# Patient Record
Sex: Male | Born: 1955
Health system: Southern US, Community
[De-identification: ages and names within clinical notes are randomized; demographics above are authoritative.]

## PROBLEM LIST (undated history)

## (undated) DIAGNOSIS — I739 Peripheral vascular disease, unspecified: Secondary | ICD-10-CM

## (undated) DIAGNOSIS — E119 Type 2 diabetes mellitus without complications: Secondary | ICD-10-CM

## (undated) DIAGNOSIS — I10 Essential (primary) hypertension: Secondary | ICD-10-CM

## (undated) DIAGNOSIS — E785 Hyperlipidemia, unspecified: Secondary | ICD-10-CM

## (undated) DIAGNOSIS — H9191 Unspecified hearing loss, right ear: Secondary | ICD-10-CM

## (undated) DIAGNOSIS — H9192 Unspecified hearing loss, left ear: Secondary | ICD-10-CM

## (undated) DIAGNOSIS — I509 Heart failure, unspecified: Secondary | ICD-10-CM

## (undated) DIAGNOSIS — N529 Male erectile dysfunction, unspecified: Secondary | ICD-10-CM

## (undated) HISTORY — PX: RETINAL LASER PROCEDURE: SHX2339

## (undated) HISTORY — PX: VASECTOMY: SHX75

## (undated) HISTORY — DX: Hyperlipidemia, unspecified: E78.5

## (undated) HISTORY — DX: Male erectile dysfunction, unspecified: N52.9

## (undated) HISTORY — DX: Essential (primary) hypertension: I10

## (undated) HISTORY — DX: Peripheral vascular disease, unspecified: I73.9

## (undated) NOTE — *Deleted (*Deleted)
***In Progress*** PCP: Dr. Sherril Croon HF Cardiology: Dr. Shirlee Latch  HPI:  14 y.o. with history of HTN, DM, PAD, CKD and systolic CHF presents for CHF clinic followup.  He has had a long history of difficult-to-control HTN.  Medications were adjusted and repeat echo in 12/17 showed improvement in EF to 60-65%.  He has known PAD which was significant on 12/17 peripheral arterial dopplers.  He saw Dr. Allyson Sabal for evaluation and conservative treatment was recommended.  Echo in 12/18 showed EF 60-65% and moderate LVH. Echo in 11/20 showed EF 60-65%, RV normal.   Of note, he did not tolerate spironolactone due to hyperkalemia.   Recently returned for routine follow-up with Robbie Lis, PA on 10/18/20. He reported feeling well and denied resting or exertional dyspnea. No orthopnea/ PND or LEE. Also denied CP. No LE claudication. Reported full med compliance but had not yet taken morning meds. BP was 160/90. He reported his BP at home was usually ~140s/70-80s. EKG showed NSR w/ mild inferolateral TWIs, c/w prior EKGs. His weight was up 8 lbs since his visit last year. ReDs Clip 33%.   Today he returns to HF clinic for pharmacist medication titration. At last visit with APP, Entresto was increased to 97/103 mg BID.   164/90, HR 84, Wt 189 lbs *Check BMET since incr'd Entresto *Off spiro since Jan 2020 for hyperK (5.8) Plan A: Increase hydral 100 TID for BP Plan B: ?spiro 12.5 and monitor closely F/u: DM in december  Overall feeling ***. Dizziness, lightheadedness, fatigue:  Chest pain or palpitations:  How is your breathing?: *** SOB: Able to complete all ADLs. Activity level ***  Weight at home pounds. Takes furosemide/torsemide/bumex *** mg *** daily.  LEE PND/Orthopnea  Appetite *** Low-salt diet:   Physical Exam Cost/affordability of meds     HF Medications: Carvedilol 25 mg BID Entresto 97/103 mg BID Hydralazine 75 mg TID Isosorbide mononitrate 60 mg daily -Amlodipine 10 mg  daily  Has the patient been experiencing any side effects to the medications prescribed?  {YES NO:22349}  Does the patient have any problems obtaining medications due to transportation or finances?   {YES NO:22349} - Entresto from [employer]  Understanding of regimen: {excellent/good/fair/poor:19665} Understanding of indications: {excellent/good/fair/poor:19665} Potential of compliance: {excellent/good/fair/poor:19665} Patient understands to avoid NSAIDs. Patient understands to avoid decongestants.    Pertinent Lab Values: . Serum creatinine ***, BUN ***, Potassium ***, Sodium ***, BNP ***, Magnesium ***, Digoxin ***   Vital Signs: . Weight: *** (last clinic weight: ***) . Blood pressure: ***  . Heart rate: ***   Assessment: 1. Chronic systolic CHF: Echo 11/16 with EF 30-35%.  Given improvement in EF to 60-65% in 12/17 and 12/18 with BP control, suspect this was a hypertensive cardiomyopathy.  EF stable on repeat echo 11/20, 60-65%, mod LVH, RV normal.  - NYHA Class II, Volume status stable on exam and by ReDs clip, 33%*** - Continue carvedilol 25 mg BID - Continue Entresto 97/103 mg BID - Continue hydralazine 75 mg TID - Continue isosorbide mononitrate 60 mg daily  - He is off spironolactone due to hyperkalemia.    2. HTN: Renal arterial dopplers in 11/16 did not show evidence for renal artery stenosis. Sleep study negative for OSA. BP elevated today 160/90 but has not yet taken am meds. BP at home averages in 140s/70s-80, per pt report.  - Continue amlodipine 10 mg daily - Continue carvedilol, Entresto, hydralazine, and isosorbide mononitrate as above  3. PAD: denies claudication.  ABIs abnormal in  1/20 but stable. Multiple risk factors for disease progression and decreased pedal pulses on exam - repeat bilateral LE arterial dopplers  - Continue ASA 81 mg daily - Continue atorvastatin 80 mg daily    4. Hyperlipidemia: LDL goal w/ PAD < 70 mg/dl. - Continue atorvastatin 80 mg  daily  5. CKD: Stage 3.    6. T2DM  - Most recent hgb A1c 7.9 - On insulin, managed by PCP    Plan: 1) Medication changes: Based on clinical presentation, vital signs and recent labs will *** 2) Labs: *** 3) Follow-up: 12/05/20 w/ Dr. Shirlee Latch   Tama Headings, PharmD PGY2 Cardiology Pharmacy Resident  Karle Plumber, PharmD, BCPS, [facility], CPP Heart Failure Clinic Pharmacist [phone]

---

## 2002-10-24 ENCOUNTER — Ambulatory Visit (HOSPITAL_COMMUNITY): Admission: RE | Admit: 2002-10-24 | Discharge: 2002-10-24 | Payer: Self-pay | Admitting: Cardiology

## 2006-03-18 ENCOUNTER — Ambulatory Visit: Payer: Self-pay | Admitting: Cardiology

## 2011-11-06 ENCOUNTER — Encounter: Payer: Self-pay | Admitting: Vascular Surgery

## 2011-11-24 ENCOUNTER — Encounter: Payer: Self-pay | Admitting: Vascular Surgery

## 2011-11-25 ENCOUNTER — Ambulatory Visit (INDEPENDENT_AMBULATORY_CARE_PROVIDER_SITE_OTHER): Payer: Medicare Other | Admitting: Vascular Surgery

## 2011-11-25 ENCOUNTER — Encounter: Payer: Self-pay | Admitting: Vascular Surgery

## 2011-11-25 VITALS — BP 239/104 | HR 104 | Resp 20 | Ht 65.0 in | Wt 194.0 lb

## 2011-11-25 DIAGNOSIS — I7092 Chronic total occlusion of artery of the extremities: Secondary | ICD-10-CM

## 2011-11-25 NOTE — Progress Notes (Signed)
Subjective:     Patient ID: Derek Mckenzie, male   DOB: 03-12-1956, 55 y.o.   MRN: [id number]  HPI 55 year old male was referred for lower extremity occlusive disease. He has some mild right calf claudication symptoms after walking a few blocks. Both feet feel "cold" at times. He denies rest pain or history of nonhealing ulcers, infection, cellulitis, or gangrene. He will occasionally have some mild tingling in both feet. He states his legs are not limiting his ability to "get around"  Past Medical History  Diagnosis Date  . Diabetes mellitus   . ED (erectile dysfunction)   . PAD (peripheral artery disease)   . Hyperlipidemia   . Hypertension     History  Substance Use Topics  . Smoking status: Current Everyday Smoker -- 0.5 packs/day    Types: Cigarettes  . Smokeless tobacco: Never Used  . Alcohol Use: No    Family History  Problem Relation Age of Onset  . Hypertension Mother   . Stroke Mother   . Heart disease Father     No Known Allergies  Current outpatient prescriptions:albuterol (PROVENTIL HFA;VENTOLIN HFA) 108 (90 BASE) MCG/ACT inhaler, Inhale 2 puffs into the lungs 2 (two) times daily.  , Disp: , Rfl: ;  amLODipine-valsartan (EXFORGE) 10-320 MG per tablet, Take 1 tablet by mouth daily.  , Disp: , Rfl: ;  atenolol (TENORMIN) 100 MG tablet, Take 100 mg by mouth daily.  , Disp: , Rfl:  azelastine (ASTELIN) 137 MCG/SPRAY nasal spray, Place 1 spray into the nose 2 (two) times daily. Use in each nostril as directed , Disp: , Rfl: ;  glucose blood test strip, 1 each by Other route as needed. Use as instructed , Disp: , Rfl: ;  guaiFENesin-codeine (ROBITUSSIN AC) 100-10 MG/5ML syrup, Take 5 mLs by mouth at bedtime as needed and may repeat dose one time if needed.  , Disp: , Rfl:  insulin aspart protamine-insulin aspart (NOVOLOG 70/30) (70-30) 100 UNIT/ML injection, Inject 58 Units into the skin 2 (two) times daily with a meal.  , Disp: , Rfl: ;  Insulin Pen Needle (NOVOFINE) 30G X  8 MM MISC, Inject 1 packet into the skin as needed.  , Disp: , Rfl: ;  omeprazole (PRILOSEC) 20 MG capsule, Take 20 mg by mouth daily.  , Disp: , Rfl: ;  sildenafil (VIAGRA) 100 MG tablet, Take 100 mg by mouth daily as needed.  , Disp: , Rfl:  tadalafil (CIALIS) 20 MG tablet, Take 20 mg by mouth daily as needed.  , Disp: , Rfl: ;  testosterone (ANDROGEL) 50 MG/5GM GEL, Place 5 g onto the skin daily.  , Disp: , Rfl:   BP 239/104  Pulse 104  Resp 20  Ht 5\' 5"  (1.651 m)  Wt 194 lb (87.998 kg)  BMI 32.28 kg/m2  Body mass index is 32.28 kg/(m^2).        Review of Systems patient complains of very poor hearing denies chest pain, dyspnea on exertion, PND, orthopnea, mopped assist, urinary problems, all other systems are negative and a complete review of systems     Objective:   Physical Exam blood pressure 239/104 heart rate 104 respirations 20 General he is a well-developed well-nourished male in no apparent distress with very poor hearing HEENT normal for age Chest no rhonchi or wheezing Cardiovascular regular and no murmurs carotid pulses 3+ no audible bruits Abdomen soft and obese no palpable masses He has 3+ femoral pulse on the left and 1-2+ femoral pulse on  the right with well-perfused lower extremities. Both feet are well-perfused. There is no edema. No ulcerations are noted  Lower extremity arterial Doppler study performed by[facility] revealed ABI of 0.62 on the right and 1.0 on the left. He had dampened waveforms on the right throughout.    Assessment:     Diffuse occlusive disease and right lower extremity with external iliac and femoral popliteal component. No severe symptoms at this time. Not limb threatening.    Plan:     Return if symptoms worsen with severe claudication or limb threatening ischemia such as rest pain, nonhealing ulcers, infection, or gangrene of the toe No indication to treat at the present time

## 2011-12-16 ENCOUNTER — Encounter: Payer: Self-pay | Admitting: Vascular Surgery

## 2011-12-16 ENCOUNTER — Other Ambulatory Visit: Payer: Self-pay

## 2014-06-26 DIAGNOSIS — I119 Hypertensive heart disease without heart failure: Secondary | ICD-10-CM | POA: Insufficient documentation

## 2014-06-26 DIAGNOSIS — R0602 Shortness of breath: Secondary | ICD-10-CM | POA: Insufficient documentation

## 2015-10-31 ENCOUNTER — Emergency Department (HOSPITAL_COMMUNITY): Payer: Medicare Other

## 2015-10-31 ENCOUNTER — Encounter (HOSPITAL_COMMUNITY): Payer: Self-pay | Admitting: Emergency Medicine

## 2015-10-31 ENCOUNTER — Observation Stay (HOSPITAL_COMMUNITY)
Admission: EM | Admit: 2015-10-31 | Discharge: 2015-11-01 | Disposition: A | Discharge status: Home / Self Care | Payer: Medicare Other | Attending: Infectious Diseases | Admitting: Infectious Diseases

## 2015-10-31 ENCOUNTER — Observation Stay (HOSPITAL_BASED_OUTPATIENT_CLINIC_OR_DEPARTMENT_OTHER): Payer: Medicare Other

## 2015-10-31 DIAGNOSIS — I5022 Chronic systolic (congestive) heart failure: Secondary | ICD-10-CM | POA: Diagnosis present

## 2015-10-31 DIAGNOSIS — I130 Hypertensive heart and chronic kidney disease with heart failure and stage 1 through stage 4 chronic kidney disease, or unspecified chronic kidney disease: Principal | ICD-10-CM | POA: Insufficient documentation

## 2015-10-31 DIAGNOSIS — I272 Other secondary pulmonary hypertension: Secondary | ICD-10-CM | POA: Diagnosis not present

## 2015-10-31 DIAGNOSIS — R000 Tachycardia, unspecified: Secondary | ICD-10-CM | POA: Diagnosis not present

## 2015-10-31 DIAGNOSIS — H3562 Retinal hemorrhage, left eye: Secondary | ICD-10-CM

## 2015-10-31 DIAGNOSIS — E785 Hyperlipidemia, unspecified: Secondary | ICD-10-CM | POA: Diagnosis not present

## 2015-10-31 DIAGNOSIS — R0609 Other forms of dyspnea: Secondary | ICD-10-CM

## 2015-10-31 DIAGNOSIS — I509 Heart failure, unspecified: Secondary | ICD-10-CM

## 2015-10-31 DIAGNOSIS — H3560 Retinal hemorrhage, unspecified eye: Secondary | ICD-10-CM | POA: Diagnosis not present

## 2015-10-31 DIAGNOSIS — N189 Chronic kidney disease, unspecified: Secondary | ICD-10-CM | POA: Insufficient documentation

## 2015-10-31 DIAGNOSIS — E119 Type 2 diabetes mellitus without complications: Secondary | ICD-10-CM | POA: Insufficient documentation

## 2015-10-31 DIAGNOSIS — I10 Essential (primary) hypertension: Secondary | ICD-10-CM | POA: Insufficient documentation

## 2015-10-31 DIAGNOSIS — E1151 Type 2 diabetes mellitus with diabetic peripheral angiopathy without gangrene: Secondary | ICD-10-CM

## 2015-10-31 DIAGNOSIS — Z794 Long term (current) use of insulin: Secondary | ICD-10-CM | POA: Diagnosis not present

## 2015-10-31 DIAGNOSIS — E11319 Type 2 diabetes mellitus with unspecified diabetic retinopathy without macular edema: Secondary | ICD-10-CM | POA: Diagnosis not present

## 2015-10-31 DIAGNOSIS — R0601 Orthopnea: Secondary | ICD-10-CM | POA: Insufficient documentation

## 2015-10-31 DIAGNOSIS — F17210 Nicotine dependence, cigarettes, uncomplicated: Secondary | ICD-10-CM | POA: Diagnosis not present

## 2015-10-31 DIAGNOSIS — I517 Cardiomegaly: Secondary | ICD-10-CM | POA: Diagnosis not present

## 2015-10-31 DIAGNOSIS — E1122 Type 2 diabetes mellitus with diabetic chronic kidney disease: Secondary | ICD-10-CM

## 2015-10-31 DIAGNOSIS — H918X3 Other specified hearing loss, bilateral: Secondary | ICD-10-CM | POA: Diagnosis not present

## 2015-10-31 DIAGNOSIS — Z79899 Other long term (current) drug therapy: Secondary | ICD-10-CM | POA: Insufficient documentation

## 2015-10-31 DIAGNOSIS — E118 Type 2 diabetes mellitus with unspecified complications: Secondary | ICD-10-CM | POA: Diagnosis present

## 2015-10-31 DIAGNOSIS — R0602 Shortness of breath: Secondary | ICD-10-CM

## 2015-10-31 DIAGNOSIS — I739 Peripheral vascular disease, unspecified: Secondary | ICD-10-CM | POA: Diagnosis not present

## 2015-10-31 HISTORY — DX: Unspecified hearing loss, right ear: H91.91

## 2015-10-31 HISTORY — DX: Type 2 diabetes mellitus without complications: E11.9

## 2015-10-31 HISTORY — DX: Unspecified hearing loss, left ear: H91.92

## 2015-10-31 HISTORY — DX: Heart failure, unspecified: I50.9

## 2015-10-31 LAB — CBC
HCT: 41.3 % (ref 39.0–52.0)
Hemoglobin: 13.6 g/dL (ref 13.0–17.0)
MCH: 28.8 pg (ref 26.0–34.0)
MCHC: 32.9 g/dL (ref 30.0–36.0)
MCV: 87.5 fL (ref 78.0–100.0)
Platelets: 242 10*3/uL (ref 150–400)
RBC: 4.72 MIL/uL (ref 4.22–5.81)
RDW: 14.3 % (ref 11.5–15.5)
WBC: 10.3 10*3/uL (ref 4.0–10.5)

## 2015-10-31 LAB — URINALYSIS, ROUTINE W REFLEX MICROSCOPIC
BILIRUBIN URINE: NEGATIVE
Glucose, UA: NEGATIVE mg/dL
Ketones, ur: NEGATIVE mg/dL
Leukocytes, UA: NEGATIVE
Nitrite: NEGATIVE
PROTEIN: 100 mg/dL — AB
SPECIFIC GRAVITY, URINE: 1.01 (ref 1.005–1.030)
UROBILINOGEN UA: 0.2 mg/dL (ref 0.0–1.0)
pH: 5 (ref 5.0–8.0)

## 2015-10-31 LAB — COMPREHENSIVE METABOLIC PANEL
ALT: 15 U/L — ABNORMAL LOW (ref 17–63)
ANION GAP: 9 (ref 5–15)
AST: 23 U/L (ref 15–41)
Albumin: 3.4 g/dL — ABNORMAL LOW (ref 3.5–5.0)
Alkaline Phosphatase: 87 U/L (ref 38–126)
BILIRUBIN TOTAL: 0.7 mg/dL (ref 0.3–1.2)
BUN: 27 mg/dL — ABNORMAL HIGH (ref 6–20)
CO2: 24 mmol/L (ref 22–32)
Calcium: 9.3 mg/dL (ref 8.9–10.3)
Chloride: 103 mmol/L (ref 101–111)
Creatinine, Ser: 1.43 mg/dL — ABNORMAL HIGH (ref 0.61–1.24)
GFR calc Af Amer: >60 mL/min (ref >60)
GFR, EST NON AFRICAN AMERICAN: 52 mL/min — AB (ref >60)
Glucose, Bld: 217 mg/dL — ABNORMAL HIGH (ref 65–99)
POTASSIUM: 4.3 mmol/L (ref 3.5–5.1)
Sodium: 136 mmol/L (ref 135–145)
TOTAL PROTEIN: 7.1 g/dL (ref 6.5–8.1)

## 2015-10-31 LAB — RAPID URINE DRUG SCREEN, HOSP PERFORMED
AMPHETAMINES: NOT DETECTED
BENZODIAZEPINES: NOT DETECTED
Barbiturates: NOT DETECTED
Cocaine: NOT DETECTED
OPIATES: NOT DETECTED
Tetrahydrocannabinol: NOT DETECTED

## 2015-10-31 LAB — BRAIN NATRIURETIC PEPTIDE: B Natriuretic Peptide: 196.9 pg/mL — ABNORMAL HIGH (ref 0.0–100.0)

## 2015-10-31 LAB — URINE MICROSCOPIC-ADD ON

## 2015-10-31 LAB — GLUCOSE, CAPILLARY
GLUCOSE-CAPILLARY: 183 mg/dL — AB (ref 65–99)
GLUCOSE-CAPILLARY: 218 mg/dL — AB (ref 65–99)
Glucose-Capillary: 180 mg/dL — ABNORMAL HIGH (ref 65–99)

## 2015-10-31 LAB — I-STAT TROPONIN, ED: TROPONIN I, POC: 0.02 ng/mL (ref 0.00–0.08)

## 2015-10-31 LAB — TSH: TSH: 1.36 u[IU]/mL (ref 0.350–4.500)

## 2015-10-31 MED ORDER — DIFLUPREDNATE 0.05 % OP EMUL
1.0000 [drp] | Freq: Two times a day (BID) | OPHTHALMIC | Status: DC
  Administered 2015-11-01: 1 [drp] via OPHTHALMIC

## 2015-10-31 MED ORDER — ATORVASTATIN CALCIUM 20 MG PO TABS
20.0000 mg | ORAL_TABLET | Freq: Every day | ORAL | Status: DC
  Administered 2015-11-01: 20 mg via ORAL
  Filled 2015-10-31 (×2): qty 1

## 2015-10-31 MED ORDER — AMLODIPINE BESYLATE 10 MG PO TABS
10.0000 mg | ORAL_TABLET | Freq: Every day | ORAL | Status: DC
  Administered 2015-10-31 – 2015-11-01 (×2): 10 mg via ORAL
  Filled 2015-10-31: qty 2
  Filled 2015-10-31: qty 1

## 2015-10-31 MED ORDER — INSULIN ASPART 100 UNIT/ML ~~LOC~~ SOLN
0.0000 [IU] | Freq: Three times a day (TID) | SUBCUTANEOUS | Status: DC
  Administered 2015-10-31 (×2): 2 [IU] via SUBCUTANEOUS
  Administered 2015-11-01: 5 [IU] via SUBCUTANEOUS
  Administered 2015-11-01: 2 [IU] via SUBCUTANEOUS

## 2015-10-31 MED ORDER — SENNOSIDES-DOCUSATE SODIUM 8.6-50 MG PO TABS: 1.0000 | ORAL_TABLET | Freq: Every evening | ORAL | Status: DC | PRN

## 2015-10-31 MED ORDER — HYDRALAZINE HCL 20 MG/ML IJ SOLN
5.0000 mg | Freq: Once | INTRAMUSCULAR | Status: AC
  Administered 2015-10-31: 5 mg via INTRAVENOUS
  Filled 2015-10-31: qty 1

## 2015-10-31 MED ORDER — OFLOXACIN 0.3 % OP SOLN
1.0000 [drp] | Freq: Four times a day (QID) | OPHTHALMIC | Status: DC
  Administered 2015-10-31 – 2015-11-01 (×3): 1 [drp] via OPHTHALMIC

## 2015-10-31 MED ORDER — ACETAMINOPHEN 325 MG PO TABS: 650.0000 mg | ORAL_TABLET | Freq: Four times a day (QID) | ORAL | Status: DC | PRN

## 2015-10-31 MED ORDER — ALBUTEROL SULFATE (2.5 MG/3ML) 0.083% IN NEBU: 2.5000 mg | INHALATION_SOLUTION | RESPIRATORY_TRACT | Status: DC | PRN

## 2015-10-31 MED ORDER — SPIRONOLACTONE 50 MG PO TABS
50.0000 mg | ORAL_TABLET | Freq: Two times a day (BID) | ORAL | Status: DC
  Administered 2015-10-31 – 2015-11-01 (×4): 50 mg via ORAL
  Filled 2015-10-31 (×6): qty 1

## 2015-10-31 MED ORDER — FUROSEMIDE 10 MG/ML IJ SOLN
20.0000 mg | INTRAMUSCULAR | Status: AC
  Administered 2015-10-31: 20 mg via INTRAVENOUS
  Filled 2015-10-31: qty 2

## 2015-10-31 MED ORDER — ENOXAPARIN SODIUM 40 MG/0.4ML ~~LOC~~ SOLN
40.0000 mg | SUBCUTANEOUS | Status: DC
  Administered 2015-10-31: 40 mg via SUBCUTANEOUS
  Filled 2015-10-31 (×2): qty 0.4

## 2015-10-31 MED ORDER — OFLOXACIN 0.3 % OP SOLN
1.0000 [drp] | Freq: Four times a day (QID) | OPHTHALMIC | Status: DC
  Administered 2015-10-31: 1 [drp] via OPHTHALMIC
  Filled 2015-10-31: qty 5

## 2015-10-31 MED ORDER — INSULIN ASPART 100 UNIT/ML ~~LOC~~ SOLN: 0.0000 [IU] | Freq: Three times a day (TID) | SUBCUTANEOUS | Status: DC

## 2015-10-31 MED ORDER — LATANOPROST 0.005 % OP SOLN
1.0000 [drp] | Freq: Every day | OPHTHALMIC | Status: DC
  Administered 2015-10-31: 1 [drp] via OPHTHALMIC
  Filled 2015-10-31: qty 2.5

## 2015-10-31 MED ORDER — ACETAMINOPHEN 650 MG RE SUPP: 650.0000 mg | Freq: Four times a day (QID) | RECTAL | Status: DC | PRN

## 2015-10-31 NOTE — ED Notes (Signed)
Pt placed in gown and in bed. Pt monitored by pulse ox, bp cuff, and 12-lead. 

## 2015-10-31 NOTE — ED Provider Notes (Signed)
CSN: [id number]     Arrival date & time 10/31/15  0741 History   First MD Initiated Contact with Patient 10/31/15 [phone]     Chief Complaint  Patient presents with  . Shortness of Breath   Derek Mckenzie is a 59 y.o. male with a history of diet PEs, peripheral artery disease, hyperlipidemia, hypertension and is hard of hearing who presents to the emergency department after being sent by the day [facility] because the patient was unable to lie flat for his eye surgery. Patient reports he feels very short of breath when he lies flat. He reports this is been chronic and ongoing has not worsened recently. He reports he was due to have left eye surgery today but was unable to lie flat for the surgery. Reports only feels short of breath and lying down when sitting upright he does not feel short of breath. He denies dyspnea on exertion. He reports he chronically has some lower extremity edema which has not worsened. He reports he is not on any blood pressure medication or diuretics. The patient is a former smoker. He denies history of COPD, CHF or emphysema. The patient denies fevers, chest pain, wheezing, coughing, abdominal pain, nausea, vomiting, leg pain, syncope, lightheadedness, dizziness, or headache.  (Consider location/radiation/quality/duration/timing/severity/associated sxs/prior Treatment) HPI  Past Medical History  Diagnosis Date  . Diabetes mellitus   . ED (erectile dysfunction)   . PAD (peripheral artery disease) ([place])   . Hyperlipidemia   . Hypertension    Past Surgical History  Procedure Laterality Date  . Vasectomy     Family History  Problem Relation Age of Onset  . Hypertension Mother   . Stroke Mother   . Heart disease Father    Social History  Substance Use Topics  . Smoking status: Current Every Day Smoker -- 0.50 packs/day    Types: Cigarettes  . Smokeless tobacco: Never Used  . Alcohol Use: No    Review of Systems  Constitutional: Negative for fever and  chills.  HENT: Negative for congestion and sore throat.   Eyes: Negative for visual disturbance.  Respiratory: Positive for shortness of breath. Negative for cough, chest tightness and wheezing.   Cardiovascular: Positive for leg swelling. Negative for chest pain.  Gastrointestinal: Negative for nausea, vomiting, abdominal pain and diarrhea.  Genitourinary: Negative for dysuria and difficulty urinating.  Musculoskeletal: Negative for back pain and neck pain.  Skin: Negative for rash and wound.  Neurological: Negative for syncope, weakness, light-headedness and headaches.      Allergies  Atenolol and Amlodipine besy-benazepril hcl  Home Medications   Prior to Admission medications   Medication Sig Start Date End Date Taking? Authorizing Provider  atorvastatin (LIPITOR) 20 MG tablet Take 20 mg by mouth daily. 09/28/15  Yes Historical Provider, MD  Difluprednate (DUREZOL) 0.05 % EMUL Place 1 drop into the left eye 2 (two) times daily.   Yes Historical Provider, MD  glucose blood test strip 1 each by Other route as needed. Use as instructed    Yes Historical Provider, MD  insulin NPH Human (HUMULIN N,NOVOLIN N) 100 UNIT/ML injection Inject 30 Units into the skin 2 (two) times daily before a meal.   Yes Historical Provider, MD  Insulin Pen Needle (NOVOFINE) 30G X 8 MM MISC Inject 1 packet into the skin as needed.     Yes Historical Provider, MD  latanoprost (XALATAN) 0.005 % ophthalmic solution Place 1 drop into both eyes at bedtime.   Yes Historical Provider, MD  ofloxacin (OCUFLOX) 0.3 % ophthalmic solution Place 1 drop into the left eye 4 (four) times daily.   Yes Historical Provider, MD   BP 231/126 mmHg  Pulse 109  Temp(Src) 98.5 F (36.9 C) (Oral)  Resp 15  Ht 5\' 5"  (1.651 m)  Wt 180 lb (81.647 kg)  BMI 29.95 kg/m2  SpO2 95% Physical Exam  Constitutional: He is oriented to person, place, and time. He appears well-developed and well-nourished. No distress.  Nontoxic appearing.   HENT:  Head: Normocephalic and atraumatic.  Mouth/Throat: Oropharynx is clear and moist.  Eyes: Conjunctivae are normal. Right eye exhibits no discharge. Left eye exhibits no discharge.  Neck: Normal range of motion. Neck supple. No JVD present. No tracheal deviation present.  Cardiovascular: Regular rhythm, normal heart sounds and intact distal pulses.  Exam reveals no gallop and no friction rub.   No murmur heard. Tachycardic with heart rate of 108. Bilateral radial, and dorsalis pedis pulses are intact.  Pulmonary/Chest: Effort normal. No respiratory distress. He has no wheezes. He has no rales.  Scattered crackles noted bilaterally. No respiratory distress. Patient speaking in complete sentences. The patient's oxygen saturation is 85% on room air and came up to 98% with 2 L via nasal cannula.  Abdominal: Soft. He exhibits no distension. There is no tenderness. There is no guarding.  Musculoskeletal: He exhibits edema. He exhibits no tenderness.  Bilateral pitting lower extremity and pedal edema. No lower extremity tenderness to palpation.  Lymphadenopathy:    He has no cervical adenopathy.  Neurological: He is alert and oriented to person, place, and time. Coordination normal.  Skin: Skin is warm and dry. No rash noted. He is not diaphoretic. No erythema. No pallor.  Psychiatric: He has a normal mood and affect. His behavior is normal.  Nursing note and vitals reviewed.   ED Course  Procedures (including critical care time) Labs Review Labs Reviewed  BRAIN NATRIURETIC PEPTIDE - Abnormal; Notable for the following:    B Natriuretic Peptide 196.9 (*)    All other components within normal limits  COMPREHENSIVE METABOLIC PANEL - Abnormal; Notable for the following:    Glucose, Bld 217 (*)    BUN 27 (*)    Creatinine, Ser 1.43 (*)    Albumin 3.4 (*)    ALT 15 (*)    GFR calc non Af Amer 52 (*)    All other components within normal limits  CBC  HIV ANTIBODY (ROUTINE TESTING)   URINE RAPID DRUG SCREEN, HOSP PERFORMED  URINALYSIS, ROUTINE W REFLEX MICROSCOPIC (NOT AT [facility])  Randolm Idol, ED    Imaging Review Dg Chest 2 View  10/31/2015  CLINICAL DATA:  Shortness of breath for 2 days EXAM: CHEST  2 VIEW COMPARISON:  December 12, 2012 FINDINGS: There is mild interstitial edema in the mid lower lung zones. There is no airspace consolidation. There is cardiomegaly with mild pulmonary venous hypertension. No adenopathy. There is atherosclerotic calcification in the aorta. No bone lesions. IMPRESSION: Evidence of a degree of congestive heart failure. No airspace consolidation. Electronically Signed   By: Lowella Grip III M.D.   On: 10/31/2015 09:02   I have personally reviewed and evaluated these images and lab results as part of my medical decision-making.   EKG Interpretation   Date/Time:  Wednesday October 31 2015 07:53:24 EDT Ventricular Rate:  107 PR Interval:  162 QRS Duration: 95 QT Interval:  357 QTC Calculation: 476 R Axis:   64 Text Interpretation:  Sinus tachycardia Probable  left atrial enlargement  Abnormal T, consider ischemia, diffuse leads Confirmed by Alvino Chapel  MD,  NATHAN [phone]) on 10/31/2015 10:58:06 AM      Filed Vitals:   10/31/15 0945 10/31/15 1000 10/31/15 1015 10/31/15 1030  BP: 204/105 210/95 210/115 231/126  Pulse: 100 104 110 109  Temp:      TempSrc:      Resp: 14 21 17 15   Height:      Weight:      SpO2: 93% 94% 94% 95%     MDM   Meds given in ED:  Medications  amLODipine (NORVASC) tablet 10 mg (10 mg Oral Given 10/31/15 1011)  spironolactone (ALDACTONE) tablet 50 mg (not administered)  furosemide (LASIX) injection 20 mg (20 mg Intravenous Given 10/31/15 7867)    New Prescriptions   No medications on file    Final diagnoses:  SOB (shortness of breath)   This is a 59 y.o. male with a history of diet PEs, peripheral artery disease, hyperlipidemia, hypertension and is hard of hearing who presents to the  emergency department after being sent by the day [facility] because the patient was unable to lie flat for his eye surgery. Patient reports he feels very short of breath when he lies flat. He reports this is been chronic and ongoing has not worsened recently. He reports he was due to have left eye surgery today but was unable to lie flat for the surgery. Reports only feels short of breath and lying down when sitting upright he does not feel short of breath. He denies dyspnea on exertion. He reports he chronically has some lower extremity edema which has not worsened. He reports he is not on any blood pressure medication or diuretics. On examination the patient is afebrile nontoxic appearing. He is mildly tachycardic with a heart rate of 110. He is hypertensive with a blood pressure of 221/95. Initially his oxygen saturation is 85% on room air. This improved immediately with starting 2 L of oxygen via nasal cannula. His oxygenation saturation is 98% with oxygen. Patient has crackles bilaterally on lung exam. He is in no respiratory distress. He reports he only feels short of breath on lying down. He denies chest pain. His bilateral lower extremity pitting edema. Chest x-ray indicates evidence of a degree of congestive heart failure and no airspace consolidation. CMP is normal for glucose of 217, and a creatinine of 1.43. Potassium is 4.3. There is no baseline labs to compare. His BNP is 196. CBC is unremarkable. Troponin is 0.02. Will provide patient with Lasix 20 mg and consult for admission for CHF. Patient denies history of CHF. Patient is in agreement with admission. I consulted with internal medicine teaching service Dr. Gordy Levan who accepted the patient for admission.  This patient was discussed with Dr. Alvino Chapel who agrees with assessment and plan.    Waynetta Pean, PA-C 10/31/15 Agency, MD 10/31/15 [phone]

## 2015-10-31 NOTE — Progress Notes (Signed)
Paged Dr. Gordy Levan regarding pt now being on 3E and BP 220's/100's. MD aware and this is pt "normal". Pt standing watching TV and appears to be in no distress. Will continue to monitor.

## 2015-10-31 NOTE — ED Notes (Signed)
POV from day surgery, eye surgery canceled due to SOB, cannot lie flat, edema noted to legs,  No CP or other complaints

## 2015-10-31 NOTE — Progress Notes (Signed)
Echocardiogram 2D Echocardiogram has been performed.  Derek Mckenzie 10/31/2015, 3:12 PM

## 2015-10-31 NOTE — H&P (Signed)
Date: 10/31/2015               Patient Name:  Derek Mckenzie MRN: [id number]  DOB: 1956-11-25 Age / Sex: 59 y.o., male   PCP: Derek Chroman, MD           Medical Service: Internal Medicine Teaching Service         Attending Physician: Dr. Campbell Riches, MD    First Contact: Dr. Santina Evans, MD Pager: [phone]  Second Contact: Dr. Arcelia Jew, MD Pager: [phone] (7AM-5PM Mon-Fri)       After Hours (After 5p/  First Contact Pager: [phone]  weekends / holidays): Second Contact Pager: [phone]    Most Recent Discharge Date:  10/24/02  Chief Complaint:  Chief Complaint  Patient presents with  . Shortness of Breath       History of Present Illness:  Derek Mckenzie is a 59 y.o. male who has a past medical history of Diabetes mellitus; PAD (peripheral artery disease) ([place]); Hyperlipidemia; and Hypertension.[place] Kitchen    He presents to the ED with dyspnea while attempting to lie flat today for eye surgery and was brought to the ED.  BP was also significantly elevated in the 220/100 range which patient reports is normal for years.  He reports being on many blood pressure medications at one time but nothing has ever brought his BP down.  He also experiences significant side effects to meds.  He is followed by a PCP in in [place], Derek Mckenzie who he saw a couple of weeks ago.  He is on lipitor and insulin but no other meds.  He reports having orthopnea for years and is unable to lie flat and therefore sleeps on his side.  Reports using 1 pillow.  Denies any dyspnea on exertion and walks around [employer] frequently without any dyspnea.  Denies any LE edema or weight gain.  Denies any CP, fever, chills.  He reports having a heart cath years ago with Derek Mckenzie? that was normal.  Does not recall having an echocardiogram.  Also reports having a sleep study that was normal.  Denies current tobacco abuse (reports smoking for 20+ yrs but quit 6 yrs ago), alcohol use, or any other recreational drug use.  Denies consuming a  lot of sodium.  Pt unaware of any FH of uncontrolled HTN.   CXR in the ED shows mild interstitial edema in the mid-lower lung zones and cardiomegaly with pulmonary venous HTN.  Also with atherosclerotic calcification in the aorta.  BNP slightly elevated at 196.9.  Initially hypoxic in the ED ~86% on RA but came up to 97% RA when I saw him.  EKG with sinus tachycardia and TWI in leads I, V5,6 without any prior for comparison.  I-stat trop neg.   In the ED, pt was given 20mg  IV lasix and amlodipine 10mg .    Meds: Current Facility-Administered Medications  Medication Dose Route Frequency Provider Last Rate Last Dose  . acetaminophen (TYLENOL) tablet 650 mg  650 mg Oral Q6H PRN Derek Bales, MD       Or  . acetaminophen (TYLENOL) suppository 650 mg  650 mg Rectal Q6H PRN Derek Bales, MD      . albuterol (PROVENTIL) (2.5 MG/3ML) 0.083% nebulizer solution 2.5 mg  2.5 mg Nebulization Q2H PRN Derek Bales, MD      . amLODipine (NORVASC) tablet 10 mg  10 mg Oral Daily Derek Bales, MD   10 mg at 10/31/15 1011  . atorvastatin (  LIPITOR) tablet 20 mg  20 mg Oral Daily Derek Bales, MD   20 mg at 10/31/15 1154  . Difluprednate 0.05 % EMUL 1 drop  1 drop Left Eye BID Derek Bales, MD   1 drop at 10/31/15 1115  . enoxaparin (LOVENOX) injection 40 mg  40 mg Subcutaneous Q24H Derek Bales, MD      . insulin aspart (novoLOG) injection 0-9 Units  0-9 Units Subcutaneous TID WC Derek Riches, MD   2 Units at 10/31/15 1154  . latanoprost (XALATAN) 0.005 % ophthalmic solution 1 drop  1 drop Both Eyes QHS Derek Bales, MD      . ofloxacin (OCUFLOX) 0.3 % ophthalmic solution 1 drop  1 drop Left Eye QID Derek Bales, MD   1 drop at 10/31/15 1215  . senna-docusate (Senokot-S) tablet 1 tablet  1 tablet Oral QHS PRN Derek Bales, MD      . spironolactone (ALDACTONE) tablet 50 mg  50 mg Oral BID Derek Bales, MD   50 mg at 10/31/15 1050    Prescriptions prior to admission    Medication Sig Dispense Refill Last Dose  . atorvastatin (LIPITOR) 20 MG tablet Take 20 mg by mouth daily.   10/30/2015 at Unknown time  . Difluprednate (DUREZOL) 0.05 % EMUL Place 1 drop into the left eye 2 (two) times daily.   10/30/2015 at Unknown time  . glucose blood test strip 1 each by Other route as needed. Use as instructed    Taking  . insulin NPH Human (HUMULIN N,NOVOLIN N) 100 UNIT/ML injection Inject 30 Units into the skin 2 (two) times daily before a meal.   10/30/2015 at Unknown time  . Insulin Pen Needle (NOVOFINE) 30G X 8 MM MISC Inject 1 packet into the skin as needed.     Taking  . latanoprost (XALATAN) 0.005 % ophthalmic solution Place 1 drop into both eyes at bedtime.   10/30/2015 at Unknown time  . ofloxacin (OCUFLOX) 0.3 % ophthalmic solution Place 1 drop into the left eye 4 (four) times daily.   10/30/2015 at Unknown time    Allergies: Allergies as of 10/31/2015 - Review Complete 10/31/2015  Allergen Reaction Noted  . Atenolol Shortness Of Breath 10/31/2015  . Amlodipine besy-benazepril hcl Other (See Comments) 10/31/2015    PMH: Past Medical History  Diagnosis Date  . Diabetes mellitus   . ED (erectile dysfunction)   . PAD (peripheral artery disease) ([place])   . Hyperlipidemia   . Hypertension     PSH: Past Surgical History  Procedure Laterality Date  . Vasectomy      FH: Family History  Problem Relation Age of Onset  . Hypertension Mother   . Stroke Mother   . Heart disease Father     SH: Social History  Substance Use Topics  . Smoking status: Current Every Day Smoker -- 0.50 packs/day    Types: Cigarettes  . Smokeless tobacco: Never Used  . Alcohol Use: No    Review of Systems: Pertinent items are noted in HPI.  Physical Exam: BP 223/109 mmHg  Pulse 106  Temp(Src) 98.3 F (36.8 C) (Oral)  Resp 18  Ht 5\' 5"  (1.651 m)  Wt 184 lb 8.4 oz (83.7 kg)  BMI 30.71 kg/m2  SpO2 97%  Physical Exam    Lab results:  Basic Metabolic  Panel:  Recent Labs  10/31/15 0813  NA 136  K 4.3  CL 103  CO2 24  GLUCOSE  217*  BUN 27*  CREATININE 1.43*  CALCIUM 9.3    Calcium/Magnesium/Phosphorus:  Recent Labs Lab 10/31/15 0813  CALCIUM 9.3    Liver Function Tests:  Recent Labs  10/31/15 0813  AST 23  ALT 15*  ALKPHOS 87  BILITOT 0.7  PROT 7.1  ALBUMIN 3.4*   No results for input(s): LIPASE, AMYLASE in the last 72 hours. No results for input(s): AMMONIA in the last 72 hours.  CBC: Lab Results  Component Value Date   WBC 10.3 10/31/2015   HGB 13.6 10/31/2015   HCT 41.3 10/31/2015   MCV 87.5 10/31/2015   PLT 242 10/31/2015    Lipase: No results found for: LIPASE  Lactic Acid/Procalcitonin: No results for input(s): LATICACIDVEN, PROCALCITON, O2SATVEN in the last 168 hours.  Cardiac Enzymes:  Recent Labs  10/31/15 0817  TROPIPOC 0.02   No results found for: CKTOTAL, CKMB, CKMBINDEX, TROPONINI  BNP: No results for input(s): PROBNP in the last 72 hours.  D-Dimer: No results for input(s): DDIMER in the last 72 hours.  CBG:  Recent Labs  10/31/15 1145  GLUCAP 180*    Hemoglobin A1C: No results for input(s): HGBA1C in the last 72 hours.  Lipid Panel: No results for input(s): CHOL, HDL, LDLCALC, TRIG, CHOLHDL, LDLDIRECT in the last 72 hours.  Thyroid Function Tests: No results for input(s): TSH, T4TOTAL, FREET4, T3FREE, THYROIDAB in the last 72 hours.  Anemia Panel: No results for input(s): VITAMINB12, FOLATE, FERRITIN, TIBC, IRON, RETICCTPCT in the last 72 hours.  Coagulation: No results for input(s): LABPROT, INR in the last 72 hours.  Urine Drug Screen: Drugs of Abuse:     Component Value Date/Time   LABOPIA NONE DETECTED 10/31/2015 1014   COCAINSCRNUR NONE DETECTED 10/31/2015 1014   LABBENZ NONE DETECTED 10/31/2015 1014   AMPHETMU NONE DETECTED 10/31/2015 1014   THCU NONE DETECTED 10/31/2015 1014   LABBARB NONE DETECTED 10/31/2015 1014    Alcohol Level: No  results for input(s): ETH in the last 72 hours.  Urinalysis:    Component Value Date/Time   COLORURINE YELLOW 10/31/2015 [place] 10/31/2015 1014   LABSPEC 1.010 10/31/2015 1014   PHURINE 5.0 10/31/2015 1014   GLUCOSEU NEGATIVE 10/31/2015 1014   HGBUR TRACE* 10/31/2015 [place] 10/31/2015 1014   [place] 10/31/2015 1014   PROTEINUR 100* 10/31/2015 1014   UROBILINOGEN 0.2 10/31/2015 1014   NITRITE NEGATIVE 10/31/2015 1014   LEUKOCYTESUR NEGATIVE 10/31/2015 1014    Imaging results:  Dg Chest 2 View  10/31/2015  CLINICAL DATA:  Shortness of breath for 2 days EXAM: CHEST  2 VIEW COMPARISON:  December 12, 2012 FINDINGS: There is mild interstitial edema in the mid lower lung zones. There is no airspace consolidation. There is cardiomegaly with mild pulmonary venous hypertension. No adenopathy. There is atherosclerotic calcification in the aorta. No bone lesions. IMPRESSION: Evidence of a degree of congestive heart failure. No airspace consolidation. Electronically Signed   By: Lowella Grip III M.D.   On: 10/31/2015 09:02    EKG: EKG Interpretation  Date/Time:  Wednesday October 31 2015 07:53:24 EDT Ventricular Rate:  107 PR Interval:  162 QRS Duration: 95 QT Interval:  357 QTC Calculation: 476 R Axis:   64 Text Interpretation:  Sinus tachycardia Probable left atrial enlargement Abnormal T, consider ischemia, diffuse leads Confirmed by Alvino Chapel  MD, NATHAN [id number]) on 10/31/2015 10:58:06 AM   Antibiotics: Antibiotics Given (last 72 hours)    None  Anti-infectives    None     Consults:    Assessment & Plan by Problem: Principal Problem:   CHF (congestive heart failure) (HCC) Active Problems:   Diabetes mellitus   Hyperlipidemia   Hypertension   PAD (peripheral artery disease) ([facility])  Chronic CHF  Basically, the pt appears to be at his baseline and issues are chronic.  I do not feel he is in acute exacerbation  but was sent over from the surgery because he could not lie flat.  Pt does not appear to be volume overloaded on exam.  CXR with CHF and cardiomegaly.  BNP slightly elevated at 196.  Lungs clear on exam, however, was hypoxic initially in ED to 86% on RA.  When I saw pt he was at 97% on RA.  Pt states weight has not changed.  According to [facility] his weight was 192 lbs in 05/2014 and today is ~180 lbs.  There are notes in EPIC that state he had a previous echo with an LVEF of ~45% but I can find no actually echo.  Will be most important to get BP under more reasonable control.   -will obtain records from PCP in [place] and also Dr. Hamilton Capri  -repeat TTE -strict I&O, daily weights -need to control BP-->obtain renal duplex  -resume amlodipine and spironolactone   Uncontrolled HTN Pt reports a long-standing h/o uncontrolled HTN being put on multiple meds at once without any decline in BP.  According to [facility] he had seen Dr. Hamilton Capri (cardiologist) in [place] in 06/2014 for HTN and BP at that time was 200/110.  He was started on amlodipine, spironolactone, and losartan.  Unclear if he ever had a renal duplex to evaluate for RAS.  States he has many side effects from BP meds and currently is taking nothing.   -amlodipine 10mg  qd -resume spironolactone 50mg  bid  -renal duplex (NPO at [facility])  -TSH  Chronic Kidney Disease   Baseline creatinine unknown.  Likely from chronically uncontrolled HTN and DM.   Lab Results  Component Value Date   CREATININE 1.43* 10/31/2015  -monitor   Diabetes Mellitus II  Pt reports last HA1c ~7 and compliant with insulin.  -HA1C -SSI-S -ac and hs cbg  Dyslipidemia  Pt is on lipitor at home.   -cont statin   PAD -cont statin   FEN  Fluids-None Electrolytes-Monitor potassium, mg Nutrition- HH/CarbM  VTE prophylaxis  lovenox 40mg  SQ qd  Disposition Disposition deferred at this time, awaiting improvement of current medical problems. Anticipated  discharge in approximately 1-2 day(s).     Emergency Contact Contact Information    Name Relation Home Work [place]  [phone]        The patient does have a current PCP (Derek Chroman, MD) and does need an [facility] hospital follow-up appointment after discharge.  Signed Derek Bales, MD PGY-3, Internal Medicine Teaching Service 10/31/2015, 1:14 PM

## 2015-11-01 ENCOUNTER — Encounter (HOSPITAL_COMMUNITY): Payer: Self-pay | Admitting: General Practice

## 2015-11-01 ENCOUNTER — Observation Stay (HOSPITAL_BASED_OUTPATIENT_CLINIC_OR_DEPARTMENT_OTHER): Payer: Medicare Other

## 2015-11-01 DIAGNOSIS — I10 Essential (primary) hypertension: Secondary | ICD-10-CM

## 2015-11-01 LAB — LIPID PANEL
CHOL/HDL RATIO: 6.1 ratio
CHOLESTEROL: 159 mg/dL (ref 0–200)
HDL: 26 mg/dL — ABNORMAL LOW (ref >40)
LDL CALC: 109 mg/dL — AB (ref 0–99)
TRIGLYCERIDES: 121 mg/dL (ref <150)
VLDL: 24 mg/dL (ref 0–40)

## 2015-11-01 LAB — BASIC METABOLIC PANEL
Anion gap: 8 (ref 5–15)
BUN: 22 mg/dL — AB (ref 6–20)
CALCIUM: 9.3 mg/dL (ref 8.9–10.3)
CO2: 23 mmol/L (ref 22–32)
CREATININE: 1.42 mg/dL — AB (ref 0.61–1.24)
Chloride: 109 mmol/L (ref 101–111)
GFR calc Af Amer: >60 mL/min (ref >60)
GFR, EST NON AFRICAN AMERICAN: 53 mL/min — AB (ref >60)
GLUCOSE: 184 mg/dL — AB (ref 65–99)
Potassium: 4.1 mmol/L (ref 3.5–5.1)
Sodium: 140 mmol/L (ref 135–145)

## 2015-11-01 LAB — GLUCOSE, CAPILLARY
GLUCOSE-CAPILLARY: 174 mg/dL — AB (ref 65–99)
GLUCOSE-CAPILLARY: 191 mg/dL — AB (ref 65–99)
Glucose-Capillary: 259 mg/dL — ABNORMAL HIGH (ref 65–99)

## 2015-11-01 LAB — HIV ANTIBODY (ROUTINE TESTING W REFLEX): HIV Screen 4th Generation wRfx: NONREACTIVE

## 2015-11-01 LAB — HEMOGLOBIN A1C
Hgb A1c MFr Bld: 12.2 % — ABNORMAL HIGH (ref 4.8–5.6)
Mean Plasma Glucose: 303 mg/dL

## 2015-11-01 MED ORDER — AMLODIPINE BESYLATE 10 MG PO TABS: 10.0000 mg | ORAL_TABLET | Freq: Every day | ORAL | Status: DC

## 2015-11-01 MED ORDER — SPIRONOLACTONE 50 MG PO TABS: 50.0000 mg | ORAL_TABLET | Freq: Two times a day (BID) | ORAL | Status: DC

## 2015-11-01 MED ORDER — LOSARTAN POTASSIUM 100 MG PO TABS: 100.0000 mg | ORAL_TABLET | Freq: Every day | ORAL | Status: DC

## 2015-11-01 NOTE — Discharge Instructions (Signed)
Please make sure you come to your follow up appointments that have been made for you with your Primary Care Doctor in [place] and with the Heart Failure Clinic at [facility]  Please keep a daily log of your blood pressures and bring them to your follow up appointments.  Please make sure you take ALL your medicines as prescribed especially your Blood Pressure medications and your insulin for Diabetes.

## 2015-11-01 NOTE — Progress Notes (Signed)
Preliminary results by tech - Renal Duplex Completed. No evidence of a significant stenosis in both renal arteries. Oda Cogan, BS, RDMS, RVT

## 2015-11-01 NOTE — Progress Notes (Signed)
Subjective: Patient seen and examined this morning while on rounds.  He was standing up in the room and watching TV.  No acute events overnight and had returned from ultrasound at the time of our visit.  He is anxious to get home.  States he will take whatever medications we give him and tell him to take as long as he can leave.  States he will leave tomorrow at 12 noon whether or not he has been discharged.  Objective: Vital signs in last 24 hours: Filed Vitals:   10/31/15 1951 10/31/15 2344 11/01/15 0441 11/01/15 1018  BP: 210/114 189/95 155/94 197/104  Pulse: 104 96 99 104  Temp: 98.4 F (36.9 C) 98.3 F (36.8 C) 98.1 F (36.7 C) 98.4 F (36.9 C)  TempSrc: Oral Oral Oral Oral  Resp: _0 Height:      Weight:   181 lb 12.8 oz (82.464 kg)   SpO2: 96% 100% 99% 97%   Weight change:   Intake/Output Summary (Last 24 hours) at 11/01/15 1450 Last data filed at 11/01/15 0940  Gross per 24 hour  Intake    240 ml  Output    625 ml  Net   -385 ml   General: standing up in the room, no distress, hard of hearing HEENT: PERRL, EOMI, no scleral icterus Cardiac: RRR, no rubs, murmurs or gallops Pulm: clear to auscultation bilaterally, moving normal volumes of air Abd: soft, nontender, nondistended Ext: warm and well perfused Back: palpable fluid collections in mid-thorax/shoulder area Neuro: alert and oriented X3, cranial nerves II-XII grossly intact  Lab Results: Basic Metabolic Panel:  Recent Labs Lab 10/31/15 0813 11/01/15 0332  NA 136 140  K 4.3 4.1  CL 103 109  CO2 24 23  GLUCOSE 217* 184*  BUN 27* 22*  CREATININE 1.43* 1.42*  CALCIUM 9.3 9.3   Liver Function Tests:  Recent Labs Lab 10/31/15 0813  AST 23  ALT 15*  ALKPHOS 87  BILITOT 0.7  PROT 7.1  ALBUMIN 3.4*   No results for input(s): LIPASE, AMYLASE in the last 168 hours. No results for input(s): AMMONIA in the last 168 hours. CBC:  Recent Labs Lab 10/31/15 0813  WBC 10.3  HGB 13.6    HCT 41.3  MCV 87.5  PLT 242   Cardiac Enzymes: No results for input(s): CKTOTAL, CKMB, CKMBINDEX, TROPONINI in the last 168 hours. BNP: No results for input(s): PROBNP in the last 168 hours. D-Dimer: No results for input(s): DDIMER in the last 168 hours. CBG:  Recent Labs Lab 10/31/15 1145 10/31/15 1645 10/31/15 2210 11/01/15 0637 11/01/15 1134  GLUCAP 180* 183* 218* 174* 191*   Hemoglobin A1C:  Recent Labs Lab 10/31/15 0813  HGBA1C 12.2*   Fasting Lipid Panel:  Recent Labs Lab 11/01/15 0332  CHOL 159  HDL 26*  LDLCALC 109*  TRIG 121  CHOLHDL 6.1   Thyroid Function Tests:  Recent Labs Lab 10/31/15 1440  TSH 1.360   Coagulation: No results for input(s): LABPROT, INR in the last 168 hours. Anemia Panel: No results for input(s): VITAMINB12, FOLATE, FERRITIN, TIBC, IRON, RETICCTPCT in the last 168 hours. Urine Drug Screen: Drugs of Abuse     Component Value Date/Time   LABOPIA NONE DETECTED 10/31/2015 1014   COCAINSCRNUR NONE DETECTED 10/31/2015 1014   LABBENZ NONE DETECTED 10/31/2015 1014   AMPHETMU NONE DETECTED 10/31/2015 1014   THCU NONE DETECTED 10/31/2015 1014   LABBARB NONE DETECTED 10/31/2015 1014    Alcohol  Level: No results for input(s): ETH in the last 168 hours. Urinalysis:  Recent Labs Lab 10/31/15 1014  COLORURINE YELLOW  LABSPEC 1.010  PHURINE 5.0  GLUCOSEU NEGATIVE  HGBUR TRACE*  BILIRUBINUR NEGATIVE  KETONESUR NEGATIVE  PROTEINUR 100*  UROBILINOGEN 0.2  NITRITE NEGATIVE  LEUKOCYTESUR NEGATIVE    Micro Results: No results found for this or any previous visit (from the past 240 hour(s)). Studies/Results: Dg Chest 2 View  10/31/2015  CLINICAL DATA:  Shortness of breath for 2 days EXAM: CHEST  2 VIEW COMPARISON:  December 12, 2012 FINDINGS: There is mild interstitial edema in the mid lower lung zones. There is no airspace consolidation. There is cardiomegaly with mild pulmonary venous hypertension. No adenopathy. There is  atherosclerotic calcification in the aorta. No bone lesions. IMPRESSION: Evidence of a degree of congestive heart failure. No airspace consolidation. Electronically Signed   By: Lowella Grip III M.D.   On: 10/31/2015 09:02   Medications: Scheduled Meds: . amLODipine  10 mg Oral Daily  . atorvastatin  20 mg Oral Daily  . Difluprednate  1 drop Left Eye BID  . enoxaparin (LOVENOX) injection  40 mg Subcutaneous Q24H  . insulin aspart  0-9 Units Subcutaneous TID WC  . latanoprost  1 drop Both Eyes QHS  . ofloxacin  1 drop Left Eye QID  . spironolactone  50 mg Oral BID   Continuous Infusions:  PRN Meds:.acetaminophen **OR** acetaminophen, albuterol, senna-docusate Assessment/Plan: Principal Problem:   Chronic systolic CHF (congestive heart failure) (HCC) Active Problems:   Hyperlipidemia   Severe uncontrolled hypertension   PAD (peripheral artery disease) (HCC)   Type 2 diabetes mellitus with complication, with long-term current use of insulin ([place])  Hypertension: BP still high, however, systolic earlier this AM was 155.  Back up to the 190s this afternoon.  Diastolic BP remains consistently above 90.  Patient continues to maintain that his BP has been this way for 30+ years despite multiple medication regimens and his compliance.  Work-up for secondary causes include: OSA, RAS, hypothyroidism, primary aldosteronism, primary hyperparathyroidism - Negative sleep study done as an outpatient for OSA per [facility] records with nocturnal O2 saturations last night at 99%.   - Renal US negative for narrowing - TSH normal - Calcium normal - Sodium, potassium normal - Continue amlodipine 79m daily and spironolactone 576mBID - Will need outpatient follow up for HTN management  Diabetes mellitus type 2 - A1c 12.2  - Met with diabetes educator - recently resumed insulin after changing meds due to cost but had been without treatment for past year - Continue SSI while inpatient - At  discharge, will resume home dose, close follow up with PCP  HFrEF - EF 30-35% on Echo yesterday.  Prior Echo showed an EF of 40-45%.  Not currently in exacerbation with no oxygen requirement, minimal extremity edema, denies symptoms of orthopnea during ultrasound study - Continue diuresis - Will arrange for follow up in the CHF clinic as outpatient  Hyperlipidemia - continue Atorvastatin 2093maily  FEN Fluid: none Electrolytes: none Nutrition: Heart healthy  DVT PPx: Lovenox  Code: FULL   Dispo: Disposition is deferred at this time, awaiting improvement of current medical problems.    The patient does have a current PCP (DhrGlenda ChromanD) and [facility].  The patient does not have transportation limitations that hinder transportation to clinic appointments.  .Services Needed at time of discharge: Y = Yes, Blank =  No PT:   OT:   RN:   Equipment:   Other:       Jule Ser, DO 11/01/2015, 2:50 PM

## 2015-11-01 NOTE — Progress Notes (Signed)
IV removed per discharge order. Discharge instructions given and explained to patient with teach back. Discharged via wheelchair with NT present.

## 2015-11-01 NOTE — Progress Notes (Signed)
  Date: 11/01/2015  Patient name: Derek Mckenzie  Medical record number: [id number]  Date of birth: 10-Apr-1956   This patient's plan of care was discussed with the house staff. Please see their note for complete details. I concur with their findings.  1. Hypertension Await his renal u/s Better controlled (< 200 SBP)  2. CHF  Continue diuresis F/u in CHF clinic  3. Diabetes Mellitus 2 with complications Will need diabetes education, better control (A1C 12.2% on adm)  Campbell Riches, MD 11/01/2015, 11:23 AM

## 2015-11-01 NOTE — Discharge Summary (Signed)
Name: Derek Mckenzie MRN: [id number] DOB: 08-12-1956 59 y.o. PCP: Glenda Chroman, MD  Date of Admission: 10/31/2015  7:42 AM Date of Discharge: 11/01/2015 Attending Physician: No att. providers found  Discharge Diagnosis: Principal Problem:   Chronic systolic CHF (congestive heart failure) ([facility]) Active Problems:   Hyperlipidemia   Severe uncontrolled hypertension   PAD (peripheral artery disease) ([place])   Type 2 diabetes mellitus with complication, with long-term current use of insulin ([place])  Discharge Medications:   Medication List    TAKE these medications        amLODipine 10 MG tablet  Commonly known as:  NORVASC  Take 1 tablet (10 mg total) by mouth daily.     atorvastatin 20 MG tablet  Commonly known as:  LIPITOR  Take 20 mg by mouth daily.     DUREZOL 0.05 % Emul  Generic drug:  Difluprednate  Place 1 drop into the left eye 2 (two) times daily.     glucose blood test strip  1 each by Other route as needed. Use as instructed     insulin NPH Human 100 UNIT/ML injection  Commonly known as:  HUMULIN N,NOVOLIN N  Inject 30 Units into the skin 2 (two) times daily before a meal.     latanoprost 0.005 % ophthalmic solution  Commonly known as:  XALATAN  Place 1 drop into both eyes at bedtime.     losartan 100 MG tablet  Commonly known as:  COZAAR  Take 1 tablet (100 mg total) by mouth daily.     NOVOFINE 30G X 8 MM Misc  Generic drug:  Insulin Pen Needle  Inject 1 packet into the skin as needed.     ofloxacin 0.3 % ophthalmic solution  Commonly known as:  OCUFLOX  Place 1 drop into the left eye 4 (four) times daily.     spironolactone 50 MG tablet  Commonly known as:  ALDACTONE  Take 1 tablet (50 mg total) by mouth 2 (two) times daily.        Disposition and follow-up:   Derek Mckenzie was discharged from [facility] in [place] condition.  At the hospital follow up visit please address:  1.  HTN: per patient and chart review, he  has had chronic resistant HTN for 30+ years.  We discharged him home on Amlodipine 105m daily, Losartan 1051mdaily, and Spironolactone 5035mID.    2.  HFrEF: 2D echo revealed a worsening EF at 30-35%.  He did not appear to be in exacerbation while with us.[place] diuretics were continued and he was made an outpatient follow up with [facility].  DM type 2: Hgb A1C obtained was 12.2.  Glucose was controlled inpatient on sliding scale insulin  2.  Labs / imaging needed at time of follow-up: BMP   3.  Pending labs/ test needing follow-up: none  Follow-up Appointments: Follow-up Information    Follow up with VYAS,DHRUV B., MD On 11/09/2015.   Specialty:  Internal Medicine   Why:  Appointment at 11:30am   Contact information:   [id number] [phone]    Follow up with DalLoralie ChampagneD On 11/14/2015.   Specialty:  Cardiology   Why:  [place] 2[place]00pm [place]:   [address] [place] [phone]    Discharge Instructions:  Consultations:    Procedures Performed:  Dg Chest 2 View  10/31/2015  CLINICAL DATA:  Shortness of breath for 2 days EXAM: CHEST  2 VIEW COMPARISON:  December 12, 2012 FINDINGS: There is mild interstitial edema in the mid lower lung zones. There is no airspace consolidation. There is cardiomegaly with mild pulmonary venous hypertension. No adenopathy. There is atherosclerotic calcification in the aorta. No bone lesions. IMPRESSION: Evidence of a degree of congestive heart failure. No airspace consolidation. Electronically Signed   By: Lowella Grip III M.D.   On: 10/31/2015 09:02    2D Echo:  - Left ventricle: Wall thickness was increased in a pattern of moderate LVH. Systolic function was moderately to severely reduced. The estimated ejection fraction was in the range of 30% to 35%. - Left atrium: The atrium was  mildly dilated. - Right ventricle: The cavity size was mildly decreased. Wall thickness was mildly to moderately increased. - Pericardium, extracardiac: A small pericardial effusion was identified. There was no evidence of hemodynamic compromise.  Cardiac Cath: none  Admission HPI: Derek Mckenzie is a 59 y.o. male who has a past medical history of Diabetes mellitus; PAD (peripheral artery disease) ([place]); Hyperlipidemia; and Hypertension.Marland Kitchen   He presents to the ED with dyspnea while attempting to lie flat today for eye surgery and was brought to the ED. BP was also significantly elevated in the 220/100 range which patient reports is normal for years. He reports being on many blood pressure medications at one time but nothing has ever brought his BP down. He also experiences significant side effects to meds. He is followed by a PCP in in [place], Dr. Woody Seller who he saw a couple of weeks ago. He is on lipitor and insulin but no other meds. He reports having orthopnea for years and is unable to lie flat and therefore sleeps on his side. Reports using 1 pillow. Denies any dyspnea on exertion and walks around [employer] frequently without any dyspnea. Denies any LE edema or weight gain. Denies any CP, fever, chills. He reports having a heart cath years ago with Dr. Minette Brine? that was normal. Does not recall having an echocardiogram. Also reports having a sleep study that was normal. Denies current tobacco abuse (reports smoking for 20+ yrs but quit 6 yrs ago), alcohol use, or any other recreational drug use. Denies consuming a lot of sodium. Pt unaware of any FH of uncontrolled HTN.   CXR in the ED shows mild interstitial edema in the mid-lower lung zones and cardiomegaly with pulmonary venous HTN. Also with atherosclerotic calcification in the aorta. BNP slightly elevated at 196.9. Initially hypoxic in the ED ~86% on RA but came up to 97% RA when I saw him. EKG with sinus tachycardia and TWI  in leads I, V5,6 without any prior for comparison. I-stat trop neg.   In the ED, pt was given 28m IV lasix and amlodipine 132m   Hospital Course by problem list: Principal Problem:   Chronic systolic CHF (congestive heart failure) ([facility]) Active Problems:   Hyperlipidemia   Severe uncontrolled hypertension   PAD (peripheral artery disease) ([facility])   Type 2 diabetes mellitus with complication, with long-term current use of insulin ([place]  1. Hypertension: BP remained fairly elevated throughout the admission.  Lowest systolic measured was 15161but was more consistently elevated in the 190s.  Diastolic remained consistently above 90.  Per patient and Care Everywhere chart review, patient has had resistant HTN despite multiple medical therapies and nothing has  been able to control his BP.  Work-up was negative for secondary causes of HTN including: nocturnal O2 sats 99% (and reported normal sleep study per patient), renal ultrasound to assess for RAS, TSH was normal, Calcium was normal, Sodium and Potassium were normal.  In the hospital, we started him on Amlodipine 43m daily, Spironolactone 530mBID.  We also discharged him with Losartan 100104maily, which he has been on previously.  He will need a BMP at follow up to check his potassium on Spironolactone and Losartan.    2.  HFrEF: 2D echo revealed an EF of 30-35%.  Patient was not currently in exacerbation during hospitalization requiring no oxygen, having minimal extremity edema.  He denied symptoms of orthopnea during ultrasound study which required him to lie flat.  We did exhibit some excess fluid in dependent areas, such as his thorax after having been supine for ultrasound study.  We continued his diuresis with Spironolactone and arranged for patient [facility].  DM type 2: patient had his glucose controlled with sliding scale insulin and his home dose of insulin was resumed at discharge.  He met with diabetes  educator as well during his hospitalization to help him better understand his disease which is currently poorly controlled given his A1C of 12.2 obtained during admission.  4.  Hyperlipidemia: his home dose of Atorvastatin 71m31mily was continued.  Discharge Vitals:   BP 197/104 mmHg  Pulse 104  Temp(Src) 98.4 F (36.9 C) (Oral)  Resp 20  Ht '5\' 5"'  (1.651 m)  Wt 181 lb 12.8 oz (82.464 kg)  BMI 30.25 kg/m2  SpO2 97%  Discharge Labs:  Results for orders placed or performed during the hospital encounter of 10/31/15 (from the past 24 hour(s))  Glucose, capillary     Status: Abnormal   Collection Time: 11/01/15  4:13 PM  Result Value Ref Range   Glucose-Capillary 259 (H) 65 - 99 mg/dL    Signed: AndrJule Ser 11/02/2015, 2:22 PM    Services Ordered on Discharge: none Equipment Ordered on Discharge: none

## 2015-11-01 NOTE — Care Management Obs Status (Signed)
[place] NOTIFICATION   Patient Details  Name: Derek Mckenzie MRN: [id number] Date of Birth: 1956/06/04   Medicare Observation Status Notification Given:  Yes    Royston Bake, RN 11/01/2015, 1:52 PM

## 2015-11-01 NOTE — Progress Notes (Signed)
Inpatient Diabetes Program Recommendations  AACE/ADA: New Consensus Statement on Inpatient Glycemic Control (2015)  Target Ranges:  Prepandial:   less than 140 mg/dL      Peak postprandial:   less than 180 mg/dL (1-2 hours)      Critically ill patients:  140 - 180 mg/dL   Review of Glycemic Control  Diabetes history: DM 2 Outpatient Diabetes medications: NPH 30 units bid Current orders for Inpatient glycemic control: Sensitive correction tidwc  Inpatient Diabetes Program Recommendations: Spoke with patient and "significant other" regarding HgbA1C of 12.2% and potential need for education and/or financial needs. Significant other (assisted with conversation as patient is [facility]) states that patient had been on insulins provided as samples from his doctor's office, but when he had to pay for them, he could not afford them. Thus he could not and did not take any insulin for a year.  However, the pharmacist at the [facility] explained that he could get the Novolin products at a much less expensive price.  Now in the past month or so, he has been able to get the Novolin NPH of 30 units bid and is starting to improve his blood sugars at home.  Patient states he understands what and how to eat. He states he has been on 70/30 in the past, but he and his doctor at the time agreed that it did not work as well as the NPH alone.   Patient and significant other did not have further concerns at this point.  Thank you Rosita Kea, RN, MSN, CDE  Diabetes Inpatient Program Office: [phone] Pager: [phone] 8:00 am to 5:00 pm

## 2015-11-01 NOTE — Progress Notes (Signed)
Subjective: Derek Mckenzie slept well overnight on his side with no shortness of breath. He had just returned after Renal Duplex US at our interiew. He states he is willing to take whatever meds we want him to take, but he is ready to leave as soon as possible.    Objective: Vital signs in last 24 hours: Filed Vitals:   10/31/15 1951 10/31/15 2344 11/01/15 0441 11/01/15 1018  BP: 210/114 189/95 155/94 197/104  Pulse: 104 96 99 104  Temp: 98.4 F (36.9 C) 98.3 F (36.8 C) 98.1 F (36.7 C) 98.4 F (36.9 C)  TempSrc: Oral Oral Oral Oral  Resp: _0 Height:      Weight:   82.464 kg (181 lb 12.8 oz)   SpO2: 96% 100% 99% 97%  (These are representative of his averages over the past 24 hours. BPs 155 (asleep)-217/94-114)  General: Upright, active man, very hard of hearing, cooperative but insistent that he wants to leave HEENT: L pupil 31m, R pupil 22m both reactive to light. moist mucous membranes Neck: No carotid bruits. CV: regular rate and rhythm; no extra heart sounds appreciated Pulm: Clear to auscultation bilaterally, no crackles or wheezes, no increased WOB Abd: Soft, non-tender, no fluid wave. Back: Dependent fluid collections palpable in mid-thorax Extremities: 1+ edema bilaterally  Weight change:   Intake/Output Summary (Last 24 hours) at 11/01/15 1312 Last data filed at 11/01/15 0940  Gross per 24 hour  Intake    360 ml  Output    925 ml  Net   -565 ml   CBC    Component Value Date/Time   WBC 10.3 10/31/2015 0813   RBC 4.72 10/31/2015 0813   HGB 13.6 10/31/2015 0813   HCT 41.3 10/31/2015 0813   PLT 242 10/31/2015 0813   MCV 87.5 10/31/2015 0813   MCH 28.8 10/31/2015 0813   MCHC 32.9 10/31/2015 0813   RDW 14.3 10/31/2015 0813   BMP Latest Ref Rng 11/01/2015 10/31/2015  Glucose 65 - 99 mg/dL 184(H) 217(H)  BUN 6 - 20 mg/dL 22(H) 27(H)  Creatinine 0.61 - 1.24 mg/dL 1.42(H) 1.43(H)  Sodium 135 - 145 mmol/L 140 136  Potassium 3.5 - 5.1 mmol/L 4.1 4.3    Chloride 101 - 111 mmol/L 109 103  CO2 22 - 32 mmol/L 23 24  Calcium 8.9 - 10.3 mg/dL 9.3 9.3   Hepatic Function Latest Ref Rng 10/31/2015  Total Protein 6.5 - 8.1 g/dL 7.1  Albumin 3.5 - 5.0 g/dL 3.4(L)  AST 15 - 41 U/L 23  ALT 17 - 63 U/L 15(L)  Alk Phosphatase 38 - 126 U/L 87  Total Bilirubin 0.3 - 1.2 mg/dL 0.7    Lipid Panel     Component Value Date/Time   CHOL 159 11/01/2015 0332   TRIG 121 11/01/2015 0332   HDL 26* 11/01/2015 0332   CHOLHDL 6.1 11/01/2015 0332   VLDL 24 11/01/2015 0332   LDLCALC 109* 11/01/2015 0332    BNP    Component Value Date/Time   BNP 196.9* 10/31/2015 0813    Scheduled Meds: . amLODipine  10 mg Oral Daily  . atorvastatin  20 mg Oral Daily  . Difluprednate  1 drop Left Eye BID  . enoxaparin (LOVENOX) injection  40 mg Subcutaneous Q24H  . insulin aspart  0-9 Units Subcutaneous TID WC  . latanoprost  1 drop Both Eyes QHS  . ofloxacin  1 drop Left Eye QID  . spironolactone  50 mg Oral BID  Assessment/Plan:  Derek Mckenzie is a 59 year old man with a history of CHF, hypertension, DM2, dyslipidemia, and PAD who presented with SOB after lying down before an ophthalmologic procedure yesterday. He does not appear to be in an acute CHF exacerbation, although his EF has decreased since last Echo, with no active physical symptoms. We obtained a Renal Duplex US (neg) based on his long history of treatment-resistant Stage 2 hypertension, but at this point, it appears more likely that access and desire to take medications may represent the main barriers to better control. He is stable for discharge, and his chronic conditions warrant close follow-up in the outpatient setting.  1. Chronic CHF: not currently in exacerbation. EF on echo yesterday 30-35%, down from 40-45% on prior Echo. Likely s/t poorly controlledHTN. No current oxygen requirement or limitation in daily activities. Not clearly orthopneic, minimal edema. Some evidence of dependent fluid  overload (eg in back after lying down for Duplex US), so continued diuresis will be of great benefit to diminish future SOB/orthopneic symptoms. -   Continue Spironolactone 50 mg BID, home Losartan (unclear if taking prior to admission) -   Follow-up in CHF clinic as outpatient  2. Hypertension: Pressures remain high, with a nadir at systolic 409W overnight but back up to 190s in the morning. Diastolic consistently >11, up to 110s. Per pt, his BPs have been this way for 30 years and no combination of medicines have touched it. Some workup for secondary causes of hypertension seems to have been completed outpatient, with a PSG negative for OSA (with suggestion of following nocturnal Oxygen Saturations, which were 99-100% here). Renal Artery Stenosis is the most likely etiology of secondary hypertension in man of his age, but Duplex US this AM were negative for any narrowing. No signs of Aortic Coarctation were found on Echo, and likely this would have presented in childhood. Otherwise, ROS is unsuggestive of pheochromocytoma or Conn's Dz. In short--most likely essential hypertension of a chronic nature. Outpatient followup and slow lowering to minimize cardiac risk factors recommended. -   Continue Amlodipine 10 mg qd and resume home regimen, as above -   TSH and Ca WNL   3. Diabetes Mellitus, Type II: A1C here 12.2; patient states that he is compliant with insulin.  -   Met with Diabetes Educator; recently resumed taking insulin after changing brands -   Continue Novolin NPH 30 units BID, f/u with PCP  4. Dyslipidemia: Labs notable for very low HDL and high LDL here.  -   Continue home Lipitor 20 mg.   This is a Careers information officer Note.  The care of the patient was discussed with Dr. Juleen China, and the assessment and plan formulated with their assistance.  Please see their attached note for official documentation of the daily encounter.    Denton Meek, Med Student 11/01/2015, 1:12 PM

## 2015-11-02 ENCOUNTER — Telehealth (HOSPITAL_COMMUNITY): Payer: Self-pay | Admitting: Surgery

## 2015-11-02 LAB — HEPATITIS C ANTIBODY: HCV Ab: 0.1 s/co ratio (ref 0.0–0.9)

## 2015-11-02 NOTE — Telephone Encounter (Signed)
Heart Failure Nurse Navigator Post Discharge Telephone Call  I attempted to reach patient on 11/02/15 regarding his follow-up appt with the AHF Clinic as well as HF recommendations and education.  I left a message and asked him to return my call or I attempt to reach him again on Monday-11/05/15.

## 2015-11-06 ENCOUNTER — Telehealth (HOSPITAL_COMMUNITY): Payer: Self-pay | Admitting: Surgery

## 2015-11-06 NOTE — Telephone Encounter (Signed)
I attempted again to reach Derek Mckenzie regarding confirmation of his follow-up appt in the AHF Clinic as well as to review HF recommendations.  No answer on his only documented phone number.

## 2015-11-14 ENCOUNTER — Encounter (HOSPITAL_COMMUNITY): Payer: Self-pay

## 2015-11-14 ENCOUNTER — Ambulatory Visit (HOSPITAL_COMMUNITY)
Admit: 2015-11-14 | Discharge: 2015-11-14 | Disposition: A | Discharge status: Home / Self Care | Payer: Medicare Other | Source: Ambulatory Visit | Attending: Cardiology | Admitting: Cardiology

## 2015-11-14 ENCOUNTER — Other Ambulatory Visit (HOSPITAL_COMMUNITY): Payer: Self-pay | Admitting: *Deleted

## 2015-11-14 ENCOUNTER — Encounter (HOSPITAL_COMMUNITY): Payer: Self-pay | Admitting: *Deleted

## 2015-11-14 VITALS — BP 148/82 | HR 89 | Wt 189.8 lb

## 2015-11-14 DIAGNOSIS — I5022 Chronic systolic (congestive) heart failure: Secondary | ICD-10-CM | POA: Diagnosis not present

## 2015-11-14 DIAGNOSIS — E785 Hyperlipidemia, unspecified: Secondary | ICD-10-CM | POA: Insufficient documentation

## 2015-11-14 DIAGNOSIS — I10 Essential (primary) hypertension: Secondary | ICD-10-CM | POA: Diagnosis not present

## 2015-11-14 DIAGNOSIS — I110 Hypertensive heart disease with heart failure: Secondary | ICD-10-CM | POA: Diagnosis not present

## 2015-11-14 DIAGNOSIS — I739 Peripheral vascular disease, unspecified: Secondary | ICD-10-CM | POA: Insufficient documentation

## 2015-11-14 LAB — CBC
HEMATOCRIT: 43.3 % (ref 39.0–52.0)
Hemoglobin: 14.3 g/dL (ref 13.0–17.0)
MCH: 29.1 pg (ref 26.0–34.0)
MCHC: 33 g/dL (ref 30.0–36.0)
MCV: 88 fL (ref 78.0–100.0)
Platelets: 324 10*3/uL (ref 150–400)
RBC: 4.92 MIL/uL (ref 4.22–5.81)
RDW: 14 % (ref 11.5–15.5)
WBC: 11.8 10*3/uL — ABNORMAL HIGH (ref 4.0–10.5)

## 2015-11-14 LAB — BASIC METABOLIC PANEL
ANION GAP: 9 (ref 5–15)
BUN: 33 mg/dL — ABNORMAL HIGH (ref 6–20)
CHLORIDE: 108 mmol/L (ref 101–111)
CO2: 22 mmol/L (ref 22–32)
Calcium: 9.6 mg/dL (ref 8.9–10.3)
Creatinine, Ser: 1.84 mg/dL — ABNORMAL HIGH (ref 0.61–1.24)
GFR calc Af Amer: 44 mL/min — ABNORMAL LOW (ref >60)
GFR calc non Af Amer: 38 mL/min — ABNORMAL LOW (ref >60)
GLUCOSE: 160 mg/dL — AB (ref 65–99)
POTASSIUM: 4.9 mmol/L (ref 3.5–5.1)
Sodium: 139 mmol/L (ref 135–145)

## 2015-11-14 LAB — PROTIME-INR
INR: 0.99 (ref 0.00–1.49)
Prothrombin Time: 13.3 seconds (ref 11.6–15.2)

## 2015-11-14 MED ORDER — SACUBITRIL-VALSARTAN 49-51 MG PO TABS: 1.0000 | ORAL_TABLET | Freq: Two times a day (BID) | ORAL | Status: DC

## 2015-11-14 NOTE — Progress Notes (Signed)
Advanced Heart Failure Medication Review by a Pharmacist  Does the patient  feel that his/her medications are working for him/her?  yes  Has the patient been experiencing any side effects to the medications prescribed?  no  Does the patient measure his/her own blood pressure or blood glucose at home?  no   Does the patient have any problems obtaining medications due to transportation or finances?   no  Understanding of regimen: good Understanding of indications: good Potential of compliance: good Patient understands to avoid NSAIDs. Patient understands to avoid decongestants.  Issues to address at subsequent visits: None   Pharmacist comments:  Derek Mckenzie is a pleasant, hard of hearing M presenting with his wife and a current medication list. His wife reports excellent compliance of his regimen and they did not have any specific medication-related questions or concerns for me at this time.   Ruta Hinds. Velva Harman, PharmD, BCPS, CPP Clinical Pharmacist Pager: [phone] Phone: [phone] 11/14/2015 2:27 PM      Time with patient: 6 minutes Preparation and documentation time: 2 minutes Total time: 8 minutes

## 2015-11-14 NOTE — Patient Instructions (Signed)
Stop Losartan  Start Entresto 49/51 mg Twice daily   Labs today  Labs in 2 weeks  Your physician recommends that you schedule a follow-up appointment in: 3 weeks

## 2015-11-15 ENCOUNTER — Telehealth (HOSPITAL_COMMUNITY): Payer: Self-pay | Admitting: *Deleted

## 2015-11-15 MED ORDER — SPIRONOLACTONE 50 MG PO TABS: 50.0000 mg | ORAL_TABLET | Freq: Every day | ORAL | Status: DC

## 2015-11-15 NOTE — Telephone Encounter (Signed)
-----   Message from Larey Dresser, MD sent at 11/15/2015  1:11 AM EST ----- Creatinine to 1.84.  Have him decrease spironolactone to 50 mg once daily rather than bid and get BMET on Monday.

## 2015-11-15 NOTE — Addendum Note (Signed)
Encounter addended by: Larey Dresser, MD on: 11/15/2015  1:17 AM<BR>     Documentation filed: Notes Section

## 2015-11-15 NOTE — Telephone Encounter (Signed)
Notes Recorded by Scarlette Calico, RN on 11/15/2015 at 11:41 AM Pt's friend [name] is aware and agreeable, she will make sure med is decreased and bmet sch for Mon 11/21

## 2015-11-15 NOTE — Progress Notes (Addendum)
Patient ID: Derek Mckenzie, male   DOB: [birth year], 59 y.o.   MRN: [id number] PCP: Dr. Woody Seller  59 yo with history of HTN, DM, PAD, CKD and recently diagnosed systolic CHF presents for CHF clinic evaluation.  He has had a long history of difficult-to-control HTN.  In 11/16, he went for an eye procedure but due to very high BP was sent to the ER.  He was admitted with hypertensive urgency and dyspnea. BP was very difficult to control in the hospital.    Since getting home from the hospital, BP has remained high (SBP 170s-180s).  He does not get short of breath walking on flat ground but does have dyspnea with inclines.  No orthopnea/PND.  No chest pain.    He has known PAD from prior peripheral arterial dopplers in 2012.  His legs feel weak with moderate exertion.  It is very hard for him to climb stairs. No pain.  No pedal ulcers.    ECG: NSR, lateral TWIs  Labs (11/16): LDL 109, HDL 26, K 4.1, creatinine 1.42, BNP 197, TSH normal  PMH:  1. Type II diabetes. 2. HTN: x years, poorly controlled. 11/16 renal artery dopplers with no significant stenosis.  3. Deafness 4. PAD: 2012 peripheral arterial dopplers with right distal SFA stenosis and left mid SFA stenosis.  5. Sleep study negative for OSA 6. Chronic systolic CHF: Echo ([id number]) with EF 30-35%, moderate LVH.  7. CKD 8. Hyperlipidemia  FH: No cardiac disease that he knows of.  +HTN.   Social History   Social History  . Marital Status: Significant Other    Spouse Name: N/A  . Number of Children: N/A  . Years of Education: N/A   Occupational History  . Not on file.   Social History Main Topics  . Smoking status: Former Smoker -- 0.50 packs/day for 35 years    Types: Cigarettes  . Smokeless tobacco: Never Used     Comment: "quit smoking in ~ 2007"  . Alcohol Use: No  . Drug Use: No  . Sexual Activity: No   Other Topics Concern  . Not on file   Social History Narrative   ROS: All systems reviewed and negative except as  per HPI.   Current Outpatient Prescriptions  Medication Sig Dispense Refill  . amLODipine (NORVASC) 10 MG tablet Take 1 tablet (10 mg total) by mouth daily. 30 tablet 0  . atorvastatin (LIPITOR) 20 MG tablet Take 20 mg by mouth daily.    . carvedilol (COREG) 25 MG tablet Take 25 mg by mouth 2 (two) times daily with a meal.    . glucose blood test strip 1 each by Other route as needed. Use as instructed     . insulin NPH Human (HUMULIN N,NOVOLIN N) 100 UNIT/ML injection Inject 30 Units into the skin 2 (two) times daily before a meal.    . Insulin Pen Needle (NOVOFINE) 30G X 8 MM MISC Inject 1 packet into the skin as needed.      . latanoprost (XALATAN) 0.005 % ophthalmic solution Place 1 drop into both eyes at bedtime.    Marland Kitchen spironolactone (ALDACTONE) 50 MG tablet Take 1 tablet (50 mg total) by mouth 2 (two) times daily. 60 tablet 0  . Difluprednate (DUREZOL) 0.05 % EMUL Place 1 drop into the left eye 2 (two) times daily.    Marland Kitchen ofloxacin (OCUFLOX) 0.3 % ophthalmic solution Place 1 drop into the left eye 4 (four) times daily.    . sacubitril-valsartan (  ENTRESTO) 49-51 MG Take 1 tablet by mouth 2 (two) times daily. 60 tablet 3   No current facility-administered medications for this encounter.   BP 148/82 mmHg  Pulse 89  Wt 189 lb 12 oz (86.07 kg)  SpO2 97% General: NAD Neck: No JVD, no thyromegaly or thyroid nodule.  Lungs: Clear to auscultation bilaterally with normal respiratory effort. CV: Nondisplaced PMI.  Heart regular S1/S2, +S4, no murmur.  No peripheral edema.  No carotid bruit. Unable to palpate pedal pulses.   Abdomen: Soft, nontender, no hepatosplenomegaly, no distention.  Skin: Intact without lesions or rashes.  Neurologic: Alert and oriented x 3.  Psych: Normal affect. Extremities: No clubbing or cyanosis.  HEENT: Normal.   Assessment/Plan: 1. Chronic systolic CHF: Echo [id number] with EF 30-35%.  Cause of cardiomyopathy is uncertain.  It is possible that CMP is due to  long-standing HTN.  However, he has a lot of risk factors for CAD: known PAD, HTN, hyperlipidemia, diabetes.  I think that we need to rule out coronary disease as a cause of cardiomyopathy. - I will arrange for left heart cath to assess for significant CAD.  - Continue spironolactone and Coreg.   - BP remains high.  Stop losartan, start Entresto 49/51 bid. BMET today and repeat in 2 wks.  - Repeat echo in 3-4 months.  2. HTN: BP still not adequately controlled.  Renal arterial dopplers in 11/16 did not show evidence for renal artery stenosis.  Continue current regimen except stop losartan and start Entresto as above.  This ought to give some additional BP lowering.   3. PAD: Significant leg weakness with ambulation, known PAD.  I will arrange for peripheral arterial dopplers.  4. Hyperlipidemia: Goal LDL < 70.  LDL was too high when checked earlier this month but he had just started atorvastatin.  Repeat lipids in another month.   Loralie Champagne 11/15/2015

## 2015-11-16 ENCOUNTER — Telehealth (HOSPITAL_COMMUNITY): Payer: Self-pay

## 2015-11-16 DIAGNOSIS — I5020 Unspecified systolic (congestive) heart failure: Secondary | ICD-10-CM

## 2015-11-16 NOTE — Telephone Encounter (Signed)
Future order placed for BMET on 11/21 and 11/30 for [facility] in [place]

## 2015-11-16 NOTE — Telephone Encounter (Signed)
Patient would like to have blood drawn at [facility] in [place]. Future lab order for BMET for 11/21 placed

## 2015-11-19 ENCOUNTER — Other Ambulatory Visit (HOSPITAL_COMMUNITY): Payer: Medicare Other

## 2015-11-20 ENCOUNTER — Telehealth (HOSPITAL_COMMUNITY): Payer: Self-pay | Admitting: Pharmacist

## 2015-11-20 ENCOUNTER — Telehealth (HOSPITAL_COMMUNITY): Payer: Self-pay

## 2015-11-20 DIAGNOSIS — I5020 Unspecified systolic (congestive) heart failure: Secondary | ICD-10-CM

## 2015-11-20 NOTE — Telephone Encounter (Signed)
Re-ordered lab draw for [facility] lab in [place] for 11/30

## 2015-11-20 NOTE — Telephone Encounter (Signed)
Entresto 49-51 mg approved through [employer] until 11/14/2016. [facility] verified copay $45/mo.   Ruta Hinds. Velva Harman, PharmD, BCPS, CPP Clinical Pharmacist Pager: [phone] Phone: [phone] 11/20/2015 11:34 AM

## 2015-11-20 NOTE — Telephone Encounter (Signed)
Faxed order for BMET to:  [facility] [place] [address], [place] [address]  Phone Number: [phone] Fax: [phone]

## 2015-11-21 ENCOUNTER — Telehealth (HOSPITAL_COMMUNITY): Payer: Self-pay

## 2015-11-21 NOTE — Telephone Encounter (Signed)
Wife called and said that her husband needs to cancel his procedure for Friday and will have to re-schedule it for January Cancelled cath for Friday the 25th. Will reschedule at appointment in December

## 2015-11-23 ENCOUNTER — Ambulatory Visit (HOSPITAL_COMMUNITY): Admission: RE | Admit: 2015-11-23 | Payer: Medicare Other | Source: Ambulatory Visit | Admitting: Cardiology

## 2015-11-23 ENCOUNTER — Encounter (HOSPITAL_COMMUNITY): Admission: RE | Payer: Self-pay | Source: Ambulatory Visit

## 2015-11-23 SURGERY — LEFT HEART CATH AND CORONARY ANGIOGRAPHY
Anesthesia: LOCAL

## 2015-11-28 ENCOUNTER — Other Ambulatory Visit (HOSPITAL_COMMUNITY): Payer: Medicare Other

## 2015-12-05 ENCOUNTER — Telehealth (HOSPITAL_COMMUNITY): Payer: Self-pay | Admitting: Cardiology

## 2015-12-05 NOTE — Telephone Encounter (Signed)
Pt to have cath with Dr. Haroldine Laws Cpt code [phone] with patients current insurance- [facility] Pre cert# [id number] valid 11/21/15-01/05/2016

## 2015-12-12 ENCOUNTER — Encounter (HOSPITAL_COMMUNITY): Payer: Medicare Other

## 2016-01-04 DIAGNOSIS — H211X1 Other vascular disorders of iris and ciliary body, right eye: Secondary | ICD-10-CM | POA: Diagnosis not present

## 2016-01-04 DIAGNOSIS — H4311 Vitreous hemorrhage, right eye: Secondary | ICD-10-CM | POA: Diagnosis not present

## 2016-01-04 DIAGNOSIS — E113591 Type 2 diabetes mellitus with proliferative diabetic retinopathy without macular edema, right eye: Secondary | ICD-10-CM | POA: Diagnosis not present

## 2016-01-04 DIAGNOSIS — E113592 Type 2 diabetes mellitus with proliferative diabetic retinopathy without macular edema, left eye: Secondary | ICD-10-CM | POA: Diagnosis not present

## 2016-01-08 ENCOUNTER — Telehealth (HOSPITAL_COMMUNITY): Payer: Self-pay

## 2016-01-08 NOTE — Telephone Encounter (Signed)
Patient's relative called concerned that since switching insurance, patient's entresto no longer covered. Assured that our CHF pharmacist Doroteo Bradford will handle this by getting pior auth and we will be in contact once we can get this medication approved. Advised if patient runs out of medication to call our office for samples. Aware and agreeable to plan.  Derek Mckenzie

## 2016-01-11 ENCOUNTER — Telehealth (HOSPITAL_COMMUNITY): Payer: Self-pay | Admitting: Pharmacist

## 2016-01-11 NOTE — Telephone Encounter (Signed)
-----   Message from Effie Berkshire, RN sent at 01/08/2016 10:03 AM EST ----- Patient now has new insurance denying co verage for [employer]  [employer] Phone # for Insurance: [phone] PCN # PARTD [id number] GROUP # [id number] BIN # [id number] ID # [id number]  [facility]  Please call [name] with updates [phone]

## 2016-01-11 NOTE — Telephone Encounter (Signed)
Entresto 49-51 mg PA approved by [employer] through 12/28/2016. [facility] verified copay of $85/mo. Spoke with patient's friend, [name], who would like to apply for [employer] patient assistance. She verbalized that they would be in on Monday with income verification and to sign application paperwork.   Ruta Hinds. Velva Harman, PharmD, BCPS, [place] Pager: [phone] Phone: [phone] 01/11/2016 4:38 PM

## 2016-01-14 ENCOUNTER — Other Ambulatory Visit (HOSPITAL_COMMUNITY): Payer: Self-pay | Admitting: Pharmacist

## 2016-01-14 MED ORDER — SACUBITRIL-VALSARTAN 49-51 MG PO TABS: 1.0000 | ORAL_TABLET | Freq: Two times a day (BID) | ORAL | Status: DC

## 2016-01-17 ENCOUNTER — Ambulatory Visit: Payer: Self-pay | Admitting: "Endocrinology

## 2016-01-25 ENCOUNTER — Telehealth (HOSPITAL_COMMUNITY): Payer: Self-pay | Admitting: *Deleted

## 2016-01-25 NOTE — Telephone Encounter (Signed)
Pt walked in for samples of Entresto, pt given samples  Medication Samples have been provided to the patient.  Drug name: Derek Mckenzie 49/51  Qty: 2  LOT: [id number]  Exp.Date: 3/17  The patient has been instructed regarding the correct time, dose, and frequency of taking this medication, including desired effects and most common side effects.   Derek Mckenzie 10:17 AM 01/25/2016

## 2016-01-30 ENCOUNTER — Ambulatory Visit (HOSPITAL_COMMUNITY): Payer: Medicare Other

## 2016-01-30 ENCOUNTER — Telehealth (HOSPITAL_COMMUNITY): Payer: Self-pay | Admitting: Pharmacist

## 2016-01-30 NOTE — Telephone Encounter (Signed)
[employer] patient assistance denied so patient enrolled in [employer] foundation. Will have $1500 toward copay costs for Entresto through 01/28/2017. Pharmacy notified and verified no charge to patient.   Billing ID: [id number] Person Code: [place] Group: [id number] BIN: [id number] PCN for Part D: [id number]  Ruta Hinds. Velva Harman, PharmD, BCPS, CPP Clinical Pharmacist Pager: [phone] Phone: [phone] 01/30/2016 5:02 PM

## 2016-01-31 DIAGNOSIS — H4312 Vitreous hemorrhage, left eye: Secondary | ICD-10-CM | POA: Diagnosis not present

## 2016-01-31 DIAGNOSIS — Z09 Encounter for follow-up examination after completed treatment for conditions other than malignant neoplasm: Secondary | ICD-10-CM | POA: Diagnosis not present

## 2016-02-04 ENCOUNTER — Encounter: Payer: Self-pay | Admitting: "Endocrinology

## 2016-02-04 ENCOUNTER — Ambulatory Visit (INDEPENDENT_AMBULATORY_CARE_PROVIDER_SITE_OTHER): Payer: PPO | Admitting: "Endocrinology

## 2016-02-04 VITALS — BP 161/86 | HR 87 | Ht 65.0 in | Wt 190.0 lb

## 2016-02-04 DIAGNOSIS — Z794 Long term (current) use of insulin: Secondary | ICD-10-CM

## 2016-02-04 DIAGNOSIS — N182 Chronic kidney disease, stage 2 (mild): Secondary | ICD-10-CM | POA: Diagnosis not present

## 2016-02-04 DIAGNOSIS — E1122 Type 2 diabetes mellitus with diabetic chronic kidney disease: Secondary | ICD-10-CM | POA: Diagnosis not present

## 2016-02-04 MED ORDER — INSULIN NPH ISOPHANE & REGULAR (70-30) 100 UNIT/ML ~~LOC~~ SUSP: 40.0000 [IU] | Freq: Two times a day (BID) | SUBCUTANEOUS | Status: DC

## 2016-02-04 NOTE — Patient Instructions (Signed)

## 2016-02-04 NOTE — Progress Notes (Signed)
Subjective:    Patient ID: Derek Mckenzie, male    DOB: November 17, 1956. Patient is being seen in consultation for management of diabetes requested by  VYAS,DHRUV B., MD  Past Medical History  Diagnosis Date  . ED (erectile dysfunction)   . PAD (peripheral artery disease) ([place])   . Hyperlipidemia   . Hypertension   . CHF (congestive heart failure) ([place])   . Type II diabetes mellitus ([place])   . Deafness in left ear   . Hearing loss in right ear    Past Surgical History  Procedure Laterality Date  . Vasectomy     Social History   Social History  . Marital Status: Single    Spouse Name: N/A  . Number of Children: N/A  . Years of Education: N/A   Social History Main Topics  . Smoking status: Former Smoker -- 0.50 packs/day for 35 years    Types: Cigarettes  . Smokeless tobacco: Never Used     Comment: "quit smoking in ~ 2007"  . Alcohol Use: No  . Drug Use: No  . Sexual Activity: No   Other Topics Concern  . None   Social History Narrative   Outpatient Encounter Prescriptions as of 02/04/2016  Medication Sig  . amLODipine (NORVASC) 10 MG tablet Take 1 tablet (10 mg total) by mouth daily.  Marland Kitchen atorvastatin (LIPITOR) 20 MG tablet Take 20 mg by mouth daily.  . carvedilol (COREG) 25 MG tablet Take 25 mg by mouth 2 (two) times daily with a meal.  . Difluprednate (DUREZOL) 0.05 % EMUL Place 1 drop into the left eye 2 (two) times daily.  . insulin NPH-regular Human (NOVOLIN 70/30) (70-30) 100 UNIT/ML injection Inject 40 Units into the skin 2 (two) times daily with a meal.  . latanoprost (XALATAN) 0.005 % ophthalmic solution Place 1 drop into both eyes at bedtime.  Marland Kitchen ofloxacin (OCUFLOX) 0.3 % ophthalmic solution Place 1 drop into the left eye 4 (four) times daily.  . sacubitril-valsartan (ENTRESTO) 49-51 MG Take 1 tablet by mouth 2 (two) times daily.  Marland Kitchen spironolactone (ALDACTONE) 50 MG tablet Take 1 tablet (50 mg total) by mouth daily.  . [DISCONTINUED] insulin NPH Human  (HUMULIN N,NOVOLIN N) 100 UNIT/ML injection Inject 35 Units into the skin 2 (two) times daily before a meal.    No facility-administered encounter medications on file as of 02/04/2016.   ALLERGIES: Allergies  Allergen Reactions  . Atenolol Shortness Of Breath  . Amlodipine Besy-Benazepril Hcl Other (See Comments)    Headaches   VACCINATION STATUS: Immunization History  Administered Date(s) Administered  . Influenza-Unspecified 10/04/2015    Diabetes He presents for his initial diabetic visit. He has type 2 diabetes mellitus. Onset time: He was diagnosed at approximate age of 34 years. His disease course has been worsening. There are no hypoglycemic associated symptoms. Pertinent negatives for hypoglycemia include no confusion, headaches, pallor or seizures. Associated symptoms include blurred vision, polydipsia and polyuria. Pertinent negatives for diabetes include no chest pain, no fatigue, no polyphagia and no weakness. There are no hypoglycemic complications. Symptoms are worsening. There are no diabetic complications. Risk factors for coronary artery disease include diabetes mellitus, hypertension, male sex, obesity, sedentary lifestyle and tobacco exposure. Current diabetic treatment includes insulin injections (He takes Novolin and 35 units twice a day. He mentions difficulty affording better kinds of insulin.). His weight is increasing steadily. He is following a generally unhealthy diet. When asked about meal planning, he reported none. He has not had  a previous visit with a dietitian. He never participates in exercise. Home blood sugar record trend: He brought no meter nor logs to review and he admits he does not monitor blood glucose. An ACE inhibitor/angiotensin II receptor blocker is being taken. Eye exam is current.    Review of Systems  Constitutional: Negative for fever, chills, fatigue and unexpected weight change.  HENT: Negative for dental problem, mouth sores and trouble  swallowing.   Eyes: Positive for blurred vision. Negative for visual disturbance.  Respiratory: Negative for cough, choking, chest tightness, shortness of breath and wheezing.   Cardiovascular: Negative for chest pain, palpitations and leg swelling.  Gastrointestinal: Negative for nausea, vomiting, abdominal pain, diarrhea, constipation and abdominal distention.  Endocrine: Positive for polydipsia and polyuria. Negative for polyphagia.  Genitourinary: Negative for dysuria, urgency, hematuria and flank pain.  Musculoskeletal: Negative for myalgias, back pain, gait problem and neck pain.  Skin: Negative for pallor, rash and wound.  Neurological: Negative for seizures, syncope, weakness, numbness and headaches.  Psychiatric/Behavioral: Negative.  Negative for confusion and dysphoric mood.    Objective:    BP 161/86 mmHg  Pulse 87  Ht 5\' 5"  (1.651 m)  Wt 190 lb (86.183 kg)  BMI 31.62 kg/m2  SpO2 96%  Wt Readings from Last 3 Encounters:  02/04/16 190 lb (86.183 kg)  11/14/15 189 lb 12 oz (86.07 kg)  11/01/15 181 lb 12.8 oz (82.464 kg)    Physical Exam  Constitutional: He is oriented to person, place, and time. He appears well-developed and well-nourished. He is cooperative. No distress.  HENT:  Head: Normocephalic and atraumatic.  Eyes: EOM are normal.  Neck: Normal range of motion. Neck supple. No tracheal deviation present. No thyromegaly present.  Cardiovascular: Normal rate, S1 normal, S2 normal and normal heart sounds.  Exam reveals no gallop.   No murmur heard. Pulses:      Dorsalis pedis pulses are 1+ on the right side, and 1+ on the left side.       Posterior tibial pulses are 1+ on the right side, and 1+ on the left side.  Pulmonary/Chest: Breath sounds normal. No respiratory distress. He has no wheezes.  Abdominal: Soft. Bowel sounds are normal. He exhibits no distension. There is no tenderness. There is no guarding and no CVA tenderness.  Musculoskeletal: He exhibits no  edema.       Right shoulder: He exhibits no swelling and no deformity.  Neurological: He is alert and oriented to person, place, and time. He has normal strength and normal reflexes. A cranial nerve deficit is present. No sensory deficit. Gait normal.  Hard of hearing.  Skin: Skin is warm and dry. No rash noted. No cyanosis. Nails show no clubbing.  Psychiatric: He has a normal mood and affect. His speech is normal and behavior is normal. Judgment and thought content normal. Cognition and memory are normal.    CMP     Component Value Date/Time   NA 139 11/14/2015 1518   K 4.9 11/14/2015 1518   CL 108 11/14/2015 1518   CO2 22 11/14/2015 1518   GLUCOSE 160* 11/14/2015 1518   BUN 33* 11/14/2015 1518   CREATININE 1.84* 11/14/2015 1518   CALCIUM 9.6 11/14/2015 1518   PROT 7.1 10/31/2015 0813   ALBUMIN 3.4* 10/31/2015 0813   AST 23 10/31/2015 0813   ALT 15* 10/31/2015 0813   ALKPHOS 87 10/31/2015 0813   BILITOT 0.7 10/31/2015 0813   GFRNONAA 38* 11/14/2015 1518   GFRAA 44* 11/14/2015  1518    Diabetic Labs (most recent): Lab Results  Component Value Date   HGBA1C 12.2* 10/31/2015     Lipid Panel ( most recent) Lipid Panel     Component Value Date/Time   CHOL 159 11/01/2015 0332   TRIG 121 11/01/2015 0332   HDL 26* 11/01/2015 0332   CHOLHDL 6.1 11/01/2015 0332   VLDL 24 11/01/2015 0332   LDLCALC 109* 11/01/2015 0332    Assessment & Plan:   1. Type 2 diabetes mellitus with stage 2 chronic kidney disease, with long-term current use of insulin ([place])   - Patient has currently uncontrolled symptomatic type 2 DM since  60 years of age,  with most recent A1c of 12.2 %. Recent labs reviewed.   - His diabetes is complicated by stage II chronic kidney disease, and he remains at a high risk for more acute and chronic complications of diabetes which include CAD, CVA, CKD, retinopathy, and neuropathy. These are all discussed in detail with the patient.  - I have counseled the  patient on diet management and weight loss, by adopting a carbohydrate restricted/protein rich diet.  - Suggestion is made for patient to avoid simple carbohydrates   from their diet including Cakes , Desserts, Ice Cream,  Soda (  diet and regular) , Sweet Tea , Candies,  Chips, Cookies, Artificial Sweeteners,   and "Sugar-free" Products . This will help patient to have stable blood glucose profile and potentially avoid unintended weight gain.  - I encouraged the patient to switch to  unprocessed or minimally processed complex starch and increased protein intake (animal or plant source), fruits, and vegetables.  - Patient is advised to stick to a routine mealtimes to eat 3 meals  a day and avoid unnecessary snacks ( to snack only to correct hypoglycemia).  - The patient will be scheduled with Jearld Fenton, RDN, CDE for individualized DM education.  - I have approached patient with the following individualized plan to manage diabetes and patient agrees:   -He is very defensive and reluctant to engage in monitoring blood glucose and he reiterated that he cannot afford better kinds of insulin. - I  will proceed to initiate   premixed  insNovolin 70/30 40 units   with breakfast and supper when pre-meal blood glucose is above 90 mg/dL , associated with strict monitoring of glucose  AC and HS. - Patient is warned not to take insulin without proper monitoring per orders. -Adjustment parameters are given for hypo and hyperglycemia in writing. -Patient is encouraged to call clinic for blood glucose levels less than 70 or above 300 mg /dl. - I will continue NPH. -Patient is not a candidate for metformin, SGLT2 inhibitors due to CKD.  - Patient specific target  A1c;  LDL, HDL, Triglycerides, and  Waist Circumference were discussed in detail.  2) BP/HTN : uncontrolled. Continue current medications including ACEI/ARB. 3) Lipids/HPL:   Uncontrolled with LDL 109, continue statins. 4)  Weight/Diet: CDE  Consult will be initiated , exercise, and detailed carbohydrates information provided.  5) Chronic Care/Health Maintenance:  -Patient is on ACEI/ARB and Statin medications and encouraged to continue to follow up with Ophthalmology, Podiatrist at least yearly or according to recommendations, and advised to   stay away from smoking. I have recommended yearly flu vaccine and pneumonia vaccination at least every 5 years; moderate intensity exercise for up to 150 minutes weekly; and  sleep for at least 7 hours a day.  - 60 minutes of time was  spent on the care of this patient , 50% of which was applied for counseling on diabetes complications and their preventions.  - Patient to bring meter and  blood glucose logs during their next visit.   - I advised patient to maintain close follow up with VYAS,DHRUV B., MD for primary care needs.  Follow up plan: - Return in about 2 weeks (around 02/18/2016) for diabetes, high blood pressure, high cholesterol, follow up with meter and logs- no labs.  Glade Lloyd, MD Phone: [phone]  Fax: [phone]   02/04/2016, 4:25 PM

## 2016-02-18 ENCOUNTER — Ambulatory Visit: Payer: PPO | Admitting: "Endocrinology

## 2016-02-27 DIAGNOSIS — H25043 Posterior subcapsular polar age-related cataract, bilateral: Secondary | ICD-10-CM | POA: Diagnosis not present

## 2016-02-27 DIAGNOSIS — H2512 Age-related nuclear cataract, left eye: Secondary | ICD-10-CM | POA: Diagnosis not present

## 2016-02-27 DIAGNOSIS — H2513 Age-related nuclear cataract, bilateral: Secondary | ICD-10-CM | POA: Diagnosis not present

## 2016-03-04 NOTE — Patient Instructions (Signed)
Derek Mckenzie  03/04/2016     @PREFPERIOPPHARMACY @   Your procedure is scheduled on 03/11/2016.  Report to [facility] at 8:00 A.M.  Call this number if you have problems the morning of surgery:  [phone]   Remember:  Do not eat food or drink liquids after midnight.  Take these medicines the morning of surgery with A SIP OF WATER Amlodipine, Coreg   DO NOT TAKE DIABETIC MEDICATION MORNING OF PROCEDURE  TAKE ONLY 1/2 OF EVENING DOSE OF INSULIN NIGHT PRIOR TO PROCEDURE   Do not wear jewelry, make-up or nail polish.  Do not wear lotions, powders, or perfumes.  You may wear deodorant.  Do not shave 48 hours prior to surgery.  Men may shave face and neck.  Do not bring valuables to the hospital.  [facility] is not responsible for any belongings or valuables.  Contacts, dentures or bridgework may not be worn into surgery.  Leave your suitcase in the car.  After surgery it may be brought to your room.  For patients admitted to the hospital, discharge time will be determined by your treatment team.  Patients discharged the day of surgery will not be allowed to drive home.    Please read over the following fact sheets that you were given. Anesthesia Post-op Instructions     PATIENT INSTRUCTIONS POST-ANESTHESIA  IMMEDIATELY FOLLOWING SURGERY:  Do not drive or operate machinery for the first twenty four hours after surgery.  Do not make any important decisions for twenty four hours after surgery or while taking narcotic pain medications or sedatives.  If you develop intractable nausea and vomiting or a severe headache please notify your doctor immediately.  FOLLOW-UP:  Please make an appointment with your surgeon as instructed. You do not need to follow up with anesthesia unless specifically instructed to do so.  WOUND CARE INSTRUCTIONS (if applicable):  Keep a dry clean dressing on the anesthesia/puncture wound site if there is drainage.  Once the wound has quit draining you  may leave it open to air.  Generally you should leave the bandage intact for twenty four hours unless there is drainage.  If the epidural site drains for more than 36-48 hours please call the anesthesia department.  QUESTIONS?:  Please feel free to call your physician or the hospital operator if you have any questions, and they will be happy to assist you.       A cataract is a clouding of the lens of the eye. When a lens becomes cloudy, vision is reduced based on the degree and nature of the clouding. Surgery may be needed to improve vision. Surgery removes the cloudy lens and usually replaces it with a substitute lens (intraocular lens, IOL). LET YOUR EYE DOCTOR KNOW ABOUT:  Allergies to food or medicine.  Medicines taken including herbs, eye drops, over-the-counter medicines, and creams.  Use of steroids (by mouth or creams).  Previous problems with anesthetics or numbing medicine.  History of bleeding problems or blood clots.  Previous surgery.  Other health problems, including diabetes and kidney problems.  Possibility of pregnancy, if this applies. RISKS AND COMPLICATIONS  Infection.  Inflammation of the eyeball (endophthalmitis) that can spread to both eyes (sympathetic ophthalmia).  Poor wound healing.  If an IOL is inserted, it can later fall out of proper position. This is very uncommon.  Clouding of the part of your eye that holds an IOL in place. This is called an "after-cataract." These are uncommon but easily treated.  BEFORE THE PROCEDURE  Do not eat or drink anything except small amounts of water for 8 to 12 before your surgery, or as directed by your caregiver.  Unless you are told otherwise, continue any eye drops you have been prescribed.  Talk to your primary caregiver about all other medicines that you take (both prescription and nonprescription). In some cases, you may need to stop or change medicines near the time of your surgery. This is most important  if you are taking blood-thinning medicine.Do not stop medicines unless you are told to do so.  Arrange for someone to drive you to and from the procedure.  Do not put contact lenses in either eye on the day of your surgery. PROCEDURE There is more than one method for safely removing a cataract. Your doctor can explain the differences and help determine which is best for you. Phacoemulsification surgery is the most common form of cataract surgery.  An injection is given behind the eye or eye drops are given to make this a painless procedure.  A small cut (incision) is made on the edge of the clear, dome-shaped surface that covers the front of the eye (cornea).  A tiny probe is painlessly inserted into the eye. This device gives off ultrasound waves that soften and break up the cloudy center of the lens. This makes it easier for the cloudy lens to be removed by suction.  An IOL may be implanted.  The normal lens of the eye is covered by a clear capsule. Part of that capsule is intentionally left in the eye to support the IOL.  Your surgeon may or may not use stitches to close the incision. There are other forms of cataract surgery that require a larger incision and stitches to close the eye. This approach is taken in cases where the doctor feels that the cataract cannot be easily removed using phacoemulsification. AFTER THE PROCEDURE  When an IOL is implanted, it does not need care. It becomes a permanent part of your eye and cannot be seen or felt.  Your doctor will schedule follow-up exams to check on your progress.  Review your other medicines with your doctor to see which can be resumed after surgery.  Use eye drops or take medicine as prescribed by your doctor.   This information is not intended to replace advice given to you by your health care provider. Make sure you discuss any questions you have with your health care provider.   Document Released: 12/04/2011 Document Revised:  01/05/2015 Document Reviewed: 12/04/2011 Elsevier Interactive Patient Education [employer].

## 2016-03-05 ENCOUNTER — Encounter (HOSPITAL_COMMUNITY)
Admission: RE | Admit: 2016-03-05 | Discharge: 2016-03-05 | Disposition: A | Discharge status: Home / Self Care | Payer: PPO | Source: Ambulatory Visit | Attending: Ophthalmology | Admitting: Ophthalmology

## 2016-03-05 ENCOUNTER — Encounter (HOSPITAL_COMMUNITY): Payer: Self-pay

## 2016-03-05 DIAGNOSIS — Z01812 Encounter for preprocedural laboratory examination: Secondary | ICD-10-CM | POA: Diagnosis not present

## 2016-03-05 LAB — BASIC METABOLIC PANEL
Anion gap: 7 (ref 5–15)
BUN: 26 mg/dL — AB (ref 6–20)
CALCIUM: 8.9 mg/dL (ref 8.9–10.3)
CHLORIDE: 107 mmol/L (ref 101–111)
CO2: 25 mmol/L (ref 22–32)
CREATININE: 1.73 mg/dL — AB (ref 0.61–1.24)
GFR calc Af Amer: 48 mL/min — ABNORMAL LOW (ref >60)
GFR calc non Af Amer: 41 mL/min — ABNORMAL LOW (ref >60)
Glucose, Bld: 180 mg/dL — ABNORMAL HIGH (ref 65–99)
Potassium: 4.2 mmol/L (ref 3.5–5.1)
SODIUM: 139 mmol/L (ref 135–145)

## 2016-03-05 LAB — CBC
HCT: 34.3 % — ABNORMAL LOW (ref 39.0–52.0)
HEMOGLOBIN: 11.3 g/dL — AB (ref 13.0–17.0)
MCH: 28.9 pg (ref 26.0–34.0)
MCHC: 32.9 g/dL (ref 30.0–36.0)
MCV: 87.7 fL (ref 78.0–100.0)
Platelets: 313 10*3/uL (ref 150–400)
RBC: 3.91 MIL/uL — ABNORMAL LOW (ref 4.22–5.81)
RDW: 14.9 % (ref 11.5–15.5)
WBC: 10.3 10*3/uL (ref 4.0–10.5)

## 2016-03-06 DIAGNOSIS — M1990 Unspecified osteoarthritis, unspecified site: Secondary | ICD-10-CM | POA: Diagnosis not present

## 2016-03-06 DIAGNOSIS — N289 Disorder of kidney and ureter, unspecified: Secondary | ICD-10-CM | POA: Diagnosis not present

## 2016-03-06 DIAGNOSIS — I10 Essential (primary) hypertension: Secondary | ICD-10-CM | POA: Diagnosis not present

## 2016-03-06 DIAGNOSIS — E1165 Type 2 diabetes mellitus with hyperglycemia: Secondary | ICD-10-CM | POA: Diagnosis not present

## 2016-03-11 ENCOUNTER — Ambulatory Visit (HOSPITAL_COMMUNITY)
Admission: RE | Admit: 2016-03-11 | Discharge: 2016-03-11 | Disposition: A | Discharge status: Home / Self Care | Payer: PPO | Source: Ambulatory Visit | Attending: Ophthalmology | Admitting: Ophthalmology

## 2016-03-11 ENCOUNTER — Encounter (HOSPITAL_COMMUNITY): Payer: Self-pay | Admitting: *Deleted

## 2016-03-11 ENCOUNTER — Ambulatory Visit (HOSPITAL_COMMUNITY): Payer: PPO | Admitting: Anesthesiology

## 2016-03-11 ENCOUNTER — Encounter (HOSPITAL_COMMUNITY)
Admission: RE | Disposition: A | Discharge status: Home / Self Care | Payer: Self-pay | Source: Ambulatory Visit | Attending: Ophthalmology

## 2016-03-11 DIAGNOSIS — Z79899 Other long term (current) drug therapy: Secondary | ICD-10-CM | POA: Insufficient documentation

## 2016-03-11 DIAGNOSIS — I10 Essential (primary) hypertension: Secondary | ICD-10-CM | POA: Insufficient documentation

## 2016-03-11 DIAGNOSIS — I509 Heart failure, unspecified: Secondary | ICD-10-CM | POA: Insufficient documentation

## 2016-03-11 DIAGNOSIS — E119 Type 2 diabetes mellitus without complications: Secondary | ICD-10-CM | POA: Diagnosis not present

## 2016-03-11 DIAGNOSIS — Z794 Long term (current) use of insulin: Secondary | ICD-10-CM | POA: Insufficient documentation

## 2016-03-11 DIAGNOSIS — H269 Unspecified cataract: Secondary | ICD-10-CM | POA: Diagnosis not present

## 2016-03-11 DIAGNOSIS — I739 Peripheral vascular disease, unspecified: Secondary | ICD-10-CM | POA: Diagnosis not present

## 2016-03-11 DIAGNOSIS — H2512 Age-related nuclear cataract, left eye: Secondary | ICD-10-CM | POA: Diagnosis not present

## 2016-03-11 DIAGNOSIS — Z87891 Personal history of nicotine dependence: Secondary | ICD-10-CM | POA: Insufficient documentation

## 2016-03-11 HISTORY — PX: CATARACT EXTRACTION W/PHACO: SHX586

## 2016-03-11 LAB — GLUCOSE, CAPILLARY: Glucose-Capillary: 122 mg/dL — ABNORMAL HIGH (ref 65–99)

## 2016-03-11 SURGERY — PHACOEMULSIFICATION, CATARACT, WITH IOL INSERTION
Anesthesia: Monitor Anesthesia Care | Site: Eye | Laterality: Left

## 2016-03-11 MED ORDER — MIDAZOLAM HCL 2 MG/2ML IJ SOLN
1.0000 mg | INTRAMUSCULAR | Status: DC | PRN
  Administered 2016-03-11 (×2): 2 mg via INTRAVENOUS

## 2016-03-11 MED ORDER — BSS IO SOLN
INTRAOCULAR | Status: DC | PRN
  Administered 2016-03-11: 15 mL

## 2016-03-11 MED ORDER — KETOROLAC TROMETHAMINE 0.5 % OP SOLN
1.0000 [drp] | OPHTHALMIC | Status: AC
  Administered 2016-03-11 (×3): 1 [drp] via OPHTHALMIC

## 2016-03-11 MED ORDER — MIDAZOLAM HCL 2 MG/2ML IJ SOLN
INTRAMUSCULAR | Status: AC
  Filled 2016-03-11: qty 2

## 2016-03-11 MED ORDER — FENTANYL CITRATE (PF) 100 MCG/2ML IJ SOLN
25.0000 ug | INTRAMUSCULAR | Status: AC
  Administered 2016-03-11 (×2): 25 ug via INTRAVENOUS

## 2016-03-11 MED ORDER — PROVISC 10 MG/ML IO SOLN
INTRAOCULAR | Status: DC | PRN
  Administered 2016-03-11: 0.85 mL via INTRAOCULAR

## 2016-03-11 MED ORDER — CYCLOPENTOLATE-PHENYLEPHRINE 0.2-1 % OP SOLN
1.0000 [drp] | OPHTHALMIC | Status: AC
  Administered 2016-03-11 (×3): 1 [drp] via OPHTHALMIC

## 2016-03-11 MED ORDER — TETRACAINE 0.5 % OP SOLN OPTIME - NO CHARGE
OPHTHALMIC | Status: DC | PRN
  Administered 2016-03-11: 2 [drp] via OPHTHALMIC

## 2016-03-11 MED ORDER — TETRACAINE HCL 0.5 % OP SOLN
1.0000 [drp] | OPHTHALMIC | Status: AC
  Administered 2016-03-11 (×3): 1 [drp] via OPHTHALMIC

## 2016-03-11 MED ORDER — FENTANYL CITRATE (PF) 100 MCG/2ML IJ SOLN
INTRAMUSCULAR | Status: AC
  Filled 2016-03-11: qty 2

## 2016-03-11 MED ORDER — LACTATED RINGERS IV SOLN
INTRAVENOUS | Status: DC
  Administered 2016-03-11: 1000 mL via INTRAVENOUS

## 2016-03-11 MED ORDER — PHENYLEPHRINE HCL 2.5 % OP SOLN
1.0000 [drp] | OPHTHALMIC | Status: AC
  Administered 2016-03-11 (×3): 1 [drp] via OPHTHALMIC

## 2016-03-11 MED ORDER — EPINEPHRINE HCL 1 MG/ML IJ SOLN
INTRAOCULAR | Status: DC | PRN
  Administered 2016-03-11: 500 mL

## 2016-03-11 MED ORDER — EPINEPHRINE HCL 1 MG/ML IJ SOLN
INTRAMUSCULAR | Status: AC
  Filled 2016-03-11: qty 1

## 2016-03-11 SURGICAL SUPPLY — 10 items
CLOTH BEACON ORANGE TIMEOUT ST (SAFETY) ×2 IMPLANT
EYE SHIELD UNIVERSAL CLEAR (GAUZE/BANDAGES/DRESSINGS) ×2 IMPLANT
GLOVE BIOGEL PI IND STRL 7.0 (GLOVE) ×1 IMPLANT
GLOVE BIOGEL PI INDICATOR 7.0 (GLOVE) ×1
GLOVE EXAM NITRILE MD LF STRL (GLOVE) ×2 IMPLANT
LENS ALC ACRYL/TECN (Ophthalmic Related) ×2 IMPLANT
PAD ARMBOARD 7.5X6 YLW CONV (MISCELLANEOUS) ×2 IMPLANT
TAPE SURG TRANSPORE 1 IN (GAUZE/BANDAGES/DRESSINGS) ×1 IMPLANT
TAPE SURGICAL TRANSPORE 1 IN (GAUZE/BANDAGES/DRESSINGS) ×1
WATER STERILE IRR 250ML POUR (IV SOLUTION) ×2 IMPLANT

## 2016-03-11 NOTE — Op Note (Signed)
Patient brought to the operating room and prepped and draped in the usual manner.  Lid speculum inserted in left eye.  Stab incision made at the twelve o'clock position.  Provisc instilled in the anterior chamber.   A 2.4 mm. Stab incision was made temporally.  An anterior capsulotomy was done with a bent 25 gauge needle.  The nucleus was hydrodissected.  The Phaco tip was inserted in the anterior chamber and the nucleus was emulsified.  CDE was 2.71.  The cortical material was then removed with the I and A tip.  Posterior capsule was the polished.  The anterior chamber was deepened with Provisc.  A 20.0 Diopter Alcon [id number] IOL was then inserted in the capsular bag.  Provisc was then removed with the I and A tip.  The wound was then hydrated.  Patient sent to the Recovery Room in good condition with follow up in my office.  Preoperative Diagnosis:  Nuclear and PSC Cataract OS Postoperative Diagnosis:  Same Procedure name: Kelman Phacoemulsification OS with IOL

## 2016-03-11 NOTE — Transfer of Care (Signed)
Immediate Anesthesia Transfer of Care Note  Patient: Derek Mckenzie  Procedure(s) Performed: Procedure(s) with comments: CATARACT EXTRACTION PHACO AND INTRAOCULAR LENS PLACEMENT (IOC) (Left) - CDE:2.71  Patient Location: Short Stay  Anesthesia Type:MAC  Level of Consciousness: awake  Airway & Oxygen Therapy: Patient Spontanous Breathing  Post-op Assessment: Report given to RN  Post vital signs: Reviewed  Last Vitals:  Filed Vitals:   03/11/16 0920 03/11/16 0925  BP: 150/79 160/86  Pulse:    Temp:    Resp: 18 12    Complications: No apparent anesthesia complications

## 2016-03-11 NOTE — Discharge Instructions (Signed)
°  °          [facility] Instructions °[address]- [place] [address] °    ° °1. Avoid closing eyes tightly. One often closes the eye tightly when laughing, talking, sneezing, coughing or if they feel irritated. At these times, you should be careful not to close your eyes tightly. ° °2. Instill eye drops as instructed. To instill drops in your eye, open it, look up and have someone gently pull the lower lid down and instill a couple of drops inside the lower lid. ° °3. Do not touch upper lid. ° °4. Take Advil or Tylenol for pain. ° °5. You may use either eye for near work, such as reading or sewing and you may watch television. ° °6. You may have your hair done at the beauty parlor at any time. ° °7. Wear dark glasses with or without your own glasses if you are in bright light. ° °8. Call our office at [phone] or [phone] if you have sharp pain in your eye or unusual symptoms. ° °9.  FOLLOW UP WITH DR. SHAPIRO TODAY IN HIS [place] OFFICE AT 2:45pm. ° °  °I have received a copy of the above instructions and will follow them.  ° ° ° °IF YOU ARE IN IMMEDIATE DANGER CALL 911! ° °It is important for you to keep your follow-up appointment with your physician after discharge, OR, for you /your caregiver to make a follow-up appointment with your physician / medical provider after discharge. ° °Show these instructions to the next healthcare provider you see. ° Anesthesia, Adult, Care After °Refer to this sheet in the next few weeks. These instructions provide you with information on caring for yourself after your procedure. Your health care provider may also give you more specific instructions. Your treatment has been planned according to current medical practices, but problems sometimes occur. Call your health care provider if you have any problems or questions after your procedure. °WHAT TO EXPECT AFTER THE PROCEDURE °After the procedure, it is typical to  experience: °· Sleepiness. °· Nausea and vomiting. °HOME CARE INSTRUCTIONS °· For the first 24 hours after general anesthesia: °¨ Have a responsible person with you. °¨ Do not drive a car. If you are alone, do not take public transportation. °¨ Do not drink alcohol. °¨ Do not take medicine that has not been prescribed by your health care provider. °¨ Do not sign important papers or make important decisions. °¨ You may resume a normal diet and activities as directed by your health care provider. °· Change bandages (dressings) as directed. °· If you have questions or problems that seem related to general anesthesia, call the hospital and ask for the anesthetist or anesthesiologist on call. °SEEK MEDICAL CARE IF: °· You have nausea and vomiting that continue the day after anesthesia. °· You develop a rash. °SEEK IMMEDIATE MEDICAL CARE IF:  °· You have difficulty breathing. °· You have chest pain. °· You have any allergic problems. °  °This information is not intended to replace advice given to you by your health care provider. Make sure you discuss any questions you have with your health care provider. °  °Document Released: 03/23/2001 Document Revised: 01/05/2015 Document Reviewed: 04/14/2012 °Elsevier Interactive Patient Education ©2016 [employer] ° °

## 2016-03-11 NOTE — Anesthesia Postprocedure Evaluation (Signed)
Anesthesia Post Note  Patient: Derek Mckenzie  Procedure(s) Performed: Procedure(s) (LRB): CATARACT EXTRACTION PHACO AND INTRAOCULAR LENS PLACEMENT (IOC) (Left)  Patient location during evaluation: Short Stay Anesthesia Type: MAC Level of consciousness: awake and alert Pain management: pain level controlled Vital Signs Assessment: post-procedure vital signs reviewed and stable Respiratory status: spontaneous breathing Cardiovascular status: blood pressure returned to baseline Postop Assessment: no signs of nausea or vomiting Anesthetic complications: no    Last Vitals:  Filed Vitals:   03/11/16 0920 03/11/16 0925  BP: 150/79 160/86  Pulse:    Temp:    Resp: 18 12    Last Pain: There were no vitals filed for this visit.               Cisco Kindt

## 2016-03-11 NOTE — H&P (Signed)
The patient was re examined and there is no change in the patients condition since the original H and P. 

## 2016-03-11 NOTE — Anesthesia Preprocedure Evaluation (Signed)
Anesthesia Evaluation  Patient identified by MRN, date of birth, ID band Patient awake    Reviewed: Allergy & Precautions, NPO status , Patient's Chart, lab work & pertinent test results, reviewed documented beta blocker date and time   Airway Mallampati: II  TM Distance: >3 FB     Dental  (+) Poor Dentition, Missing, Chipped,    Pulmonary shortness of breath and with exertion, former smoker,    breath sounds clear to auscultation       Cardiovascular hypertension, Pt. on medications and Pt. on home beta blockers + Peripheral Vascular Disease, +CHF, + Orthopnea and + DOE   Rhythm:Regular Rate:Normal     Neuro/Psych    GI/Hepatic   Endo/Other  diabetes, Type 2  Renal/GU      Musculoskeletal   Abdominal   Peds  Hematology   Anesthesia Other Findings   Reproductive/Obstetrics                             Anesthesia Physical Anesthesia Plan  ASA: III  Anesthesia Plan: MAC   Post-op Pain Management:    Induction: Intravenous  Airway Management Planned: Nasal Cannula  Additional Equipment:   Intra-op Plan:   Post-operative Plan:   Informed Consent: I have reviewed the patients History and Physical, chart, labs and discussed the procedure including the risks, benefits and alternatives for the proposed anesthesia with the patient or authorized representative who has indicated his/her understanding and acceptance.     Plan Discussed with:   Anesthesia Plan Comments:         Anesthesia Quick Evaluation

## 2016-03-12 ENCOUNTER — Encounter (HOSPITAL_COMMUNITY): Payer: Self-pay | Admitting: Ophthalmology

## 2016-03-14 DIAGNOSIS — E1165 Type 2 diabetes mellitus with hyperglycemia: Secondary | ICD-10-CM | POA: Diagnosis not present

## 2016-03-14 DIAGNOSIS — Z299 Encounter for prophylactic measures, unspecified: Secondary | ICD-10-CM | POA: Diagnosis not present

## 2016-03-14 DIAGNOSIS — L03211 Cellulitis of face: Secondary | ICD-10-CM | POA: Diagnosis not present

## 2016-03-14 DIAGNOSIS — I10 Essential (primary) hypertension: Secondary | ICD-10-CM | POA: Diagnosis not present

## 2016-03-14 DIAGNOSIS — L0201 Cutaneous abscess of face: Secondary | ICD-10-CM | POA: Diagnosis not present

## 2016-03-25 DIAGNOSIS — H25041 Posterior subcapsular polar age-related cataract, right eye: Secondary | ICD-10-CM | POA: Diagnosis not present

## 2016-03-25 DIAGNOSIS — H2511 Age-related nuclear cataract, right eye: Secondary | ICD-10-CM | POA: Diagnosis not present

## 2016-04-01 ENCOUNTER — Other Ambulatory Visit (HOSPITAL_COMMUNITY): Payer: Self-pay | Admitting: Cardiology

## 2016-04-02 ENCOUNTER — Other Ambulatory Visit (HOSPITAL_COMMUNITY): Payer: Self-pay | Admitting: Pharmacist

## 2016-04-08 DIAGNOSIS — E113592 Type 2 diabetes mellitus with proliferative diabetic retinopathy without macular edema, left eye: Secondary | ICD-10-CM | POA: Diagnosis not present

## 2016-04-08 DIAGNOSIS — H4312 Vitreous hemorrhage, left eye: Secondary | ICD-10-CM | POA: Diagnosis not present

## 2016-04-08 DIAGNOSIS — E113591 Type 2 diabetes mellitus with proliferative diabetic retinopathy without macular edema, right eye: Secondary | ICD-10-CM | POA: Diagnosis not present

## 2016-04-08 DIAGNOSIS — H4311 Vitreous hemorrhage, right eye: Secondary | ICD-10-CM | POA: Diagnosis not present

## 2016-05-14 ENCOUNTER — Encounter (HOSPITAL_COMMUNITY): Payer: Self-pay

## 2016-05-15 ENCOUNTER — Encounter (HOSPITAL_COMMUNITY)
Admission: RE | Admit: 2016-05-15 | Discharge: 2016-05-15 | Disposition: A | Discharge status: Home / Self Care | Payer: PPO | Source: Ambulatory Visit | Attending: Ophthalmology | Admitting: Ophthalmology

## 2016-05-19 MED ORDER — LIDOCAINE HCL 3.5 % OP GEL
OPHTHALMIC | Status: AC
  Filled 2016-05-19: qty 1

## 2016-05-19 MED ORDER — LIDOCAINE HCL (PF) 1 % IJ SOLN
INTRAMUSCULAR | Status: AC
  Filled 2016-05-19: qty 2

## 2016-05-19 MED ORDER — CYCLOPENTOLATE-PHENYLEPHRINE OP SOLN OPTIME - NO CHARGE
OPHTHALMIC | Status: AC
  Filled 2016-05-19: qty 2

## 2016-05-19 MED ORDER — NEOMYCIN-POLYMYXIN-DEXAMETH 3.5-10000-0.1 OP SUSP
OPHTHALMIC | Status: AC
  Filled 2016-05-19: qty 5

## 2016-05-19 MED ORDER — PHENYLEPHRINE HCL 2.5 % OP SOLN
OPHTHALMIC | Status: AC
  Filled 2016-05-19: qty 15

## 2016-05-19 MED ORDER — TETRACAINE HCL 0.5 % OP SOLN
OPHTHALMIC | Status: AC
  Filled 2016-05-19: qty 4

## 2016-05-20 ENCOUNTER — Encounter (HOSPITAL_COMMUNITY)
Admission: RE | Disposition: A | Discharge status: Home / Self Care | Payer: Self-pay | Source: Ambulatory Visit | Attending: Ophthalmology

## 2016-05-20 ENCOUNTER — Ambulatory Visit (HOSPITAL_COMMUNITY): Payer: PPO | Admitting: Anesthesiology

## 2016-05-20 ENCOUNTER — Ambulatory Visit (HOSPITAL_COMMUNITY)
Admission: RE | Admit: 2016-05-20 | Discharge: 2016-05-20 | Disposition: A | Discharge status: Home / Self Care | Payer: PPO | Source: Ambulatory Visit | Attending: Ophthalmology | Admitting: Ophthalmology

## 2016-05-20 ENCOUNTER — Encounter (HOSPITAL_COMMUNITY): Payer: Self-pay | Admitting: Anesthesiology

## 2016-05-20 DIAGNOSIS — I110 Hypertensive heart disease with heart failure: Secondary | ICD-10-CM | POA: Insufficient documentation

## 2016-05-20 DIAGNOSIS — E119 Type 2 diabetes mellitus without complications: Secondary | ICD-10-CM | POA: Insufficient documentation

## 2016-05-20 DIAGNOSIS — E7800 Pure hypercholesterolemia, unspecified: Secondary | ICD-10-CM | POA: Insufficient documentation

## 2016-05-20 DIAGNOSIS — H25041 Posterior subcapsular polar age-related cataract, right eye: Secondary | ICD-10-CM | POA: Diagnosis not present

## 2016-05-20 DIAGNOSIS — H269 Unspecified cataract: Secondary | ICD-10-CM | POA: Diagnosis not present

## 2016-05-20 DIAGNOSIS — I509 Heart failure, unspecified: Secondary | ICD-10-CM | POA: Diagnosis not present

## 2016-05-20 DIAGNOSIS — Z79899 Other long term (current) drug therapy: Secondary | ICD-10-CM | POA: Diagnosis not present

## 2016-05-20 DIAGNOSIS — H2511 Age-related nuclear cataract, right eye: Secondary | ICD-10-CM | POA: Insufficient documentation

## 2016-05-20 DIAGNOSIS — Z794 Long term (current) use of insulin: Secondary | ICD-10-CM | POA: Diagnosis not present

## 2016-05-20 HISTORY — PX: CATARACT EXTRACTION W/PHACO: SHX586

## 2016-05-20 LAB — GLUCOSE, CAPILLARY: Glucose-Capillary: 118 mg/dL — ABNORMAL HIGH (ref 65–99)

## 2016-05-20 SURGERY — PHACOEMULSIFICATION, CATARACT, WITH IOL INSERTION
Anesthesia: Monitor Anesthesia Care | Site: Eye | Laterality: Right

## 2016-05-20 MED ORDER — PROVISC 10 MG/ML IO SOLN
INTRAOCULAR | Status: DC | PRN
  Administered 2016-05-20: 0.85 mL via INTRAOCULAR

## 2016-05-20 MED ORDER — FENTANYL CITRATE (PF) 100 MCG/2ML IJ SOLN
INTRAMUSCULAR | Status: AC
  Filled 2016-05-20: qty 2

## 2016-05-20 MED ORDER — ONDANSETRON HCL 4 MG/2ML IJ SOLN: 4.0000 mg | Freq: Once | INTRAMUSCULAR | Status: DC | PRN

## 2016-05-20 MED ORDER — CYCLOPENTOLATE-PHENYLEPHRINE 0.2-1 % OP SOLN
1.0000 [drp] | OPHTHALMIC | Status: AC
Start: 2016-05-20 — End: 2016-05-20
  Administered 2016-05-20 (×3): 1 [drp] via OPHTHALMIC

## 2016-05-20 MED ORDER — BSS IO SOLN
INTRAOCULAR | Status: DC | PRN
  Administered 2016-05-20: 15 mL

## 2016-05-20 MED ORDER — TETRACAINE HCL 0.5 % OP SOLN
1.0000 [drp] | OPHTHALMIC | Status: AC
  Administered 2016-05-20 (×3): 1 [drp] via OPHTHALMIC

## 2016-05-20 MED ORDER — KETOROLAC TROMETHAMINE 0.5 % OP SOLN
1.0000 [drp] | OPHTHALMIC | Status: AC
  Administered 2016-05-20 (×3): 1 [drp] via OPHTHALMIC

## 2016-05-20 MED ORDER — MIDAZOLAM HCL 2 MG/2ML IJ SOLN
1.0000 mg | INTRAMUSCULAR | Status: DC | PRN
  Administered 2016-05-20: 2 mg via INTRAVENOUS

## 2016-05-20 MED ORDER — EPINEPHRINE HCL 1 MG/ML IJ SOLN
INTRAOCULAR | Status: DC | PRN
  Administered 2016-05-20: 500 mL

## 2016-05-20 MED ORDER — FENTANYL CITRATE (PF) 100 MCG/2ML IJ SOLN
25.0000 ug | INTRAMUSCULAR | Status: AC
  Administered 2016-05-20 (×2): 25 ug via INTRAVENOUS

## 2016-05-20 MED ORDER — PHENYLEPHRINE HCL 2.5 % OP SOLN
1.0000 [drp] | OPHTHALMIC | Status: AC
  Administered 2016-05-20 (×3): 1 [drp] via OPHTHALMIC

## 2016-05-20 MED ORDER — LACTATED RINGERS IV SOLN
INTRAVENOUS | Status: DC
  Administered 2016-05-20: 10:00:00 via INTRAVENOUS

## 2016-05-20 MED ORDER — TETRACAINE 0.5 % OP SOLN OPTIME - NO CHARGE
OPHTHALMIC | Status: DC | PRN
  Administered 2016-05-20: 2 [drp] via OPHTHALMIC

## 2016-05-20 MED ORDER — MIDAZOLAM HCL 2 MG/2ML IJ SOLN
INTRAMUSCULAR | Status: AC
  Filled 2016-05-20: qty 2

## 2016-05-20 MED ORDER — FENTANYL CITRATE (PF) 100 MCG/2ML IJ SOLN: 25.0000 ug | INTRAMUSCULAR | Status: DC | PRN

## 2016-05-20 SURGICAL SUPPLY — 10 items
CLOTH BEACON ORANGE TIMEOUT ST (SAFETY) ×2 IMPLANT
EYE SHIELD UNIVERSAL CLEAR (GAUZE/BANDAGES/DRESSINGS) ×2 IMPLANT
GLOVE BIO SURGEON STRL SZ 6.5 (GLOVE) ×2 IMPLANT
GLOVE SKINSENSE NS SZ6.5 (GLOVE) ×1
GLOVE SKINSENSE STRL SZ6.5 (GLOVE) ×1 IMPLANT
LENS ALC ACRYL/TECN (Ophthalmic Related) ×2 IMPLANT
PAD ARMBOARD 7.5X6 YLW CONV (MISCELLANEOUS) ×2 IMPLANT
TAPE SURG TRANSPARENT 2IN (GAUZE/BANDAGES/DRESSINGS) ×1 IMPLANT
TAPE TRANSPARENT 2IN (GAUZE/BANDAGES/DRESSINGS) ×1
WATER STERILE IRR 250ML POUR (IV SOLUTION) ×2 IMPLANT

## 2016-05-20 NOTE — H&P (Signed)
The patient was re examined and there is no change in the patients condition since the original H and P. 

## 2016-05-20 NOTE — Transfer of Care (Signed)
Immediate Anesthesia Transfer of Care Note  Patient: Derek Mckenzie  Procedure(s) Performed: Procedure(s) with comments: CATARACT EXTRACTION PHACO AND INTRAOCULAR LENS PLACEMENT (IOC) (Right) - CDE: 4.33  Patient Location: Short Stay  Anesthesia Type:MAC  Level of Consciousness: awake, alert , oriented and patient cooperative  Airway & Oxygen Therapy: Patient Spontanous Breathing  Post-op Assessment: Report given to RN and Post -op Vital signs reviewed and stable  Post vital signs: Reviewed and stable  Last Vitals:  Filed Vitals:   05/20/16 0955 05/20/16 0957  BP: 162/83   Pulse:    Temp:    Resp: 16 18    Last Pain: There were no vitals filed for this visit.    Patients Stated Pain Goal: 8 ([id number])  Complications: No apparent anesthesia complications

## 2016-05-20 NOTE — Anesthesia Preprocedure Evaluation (Signed)
Anesthesia Evaluation  Patient identified by MRN, date of birth, ID band Patient awake    Reviewed: Allergy & Precautions, NPO status , Patient's Chart, lab work & pertinent test results, reviewed documented beta blocker date and time   Airway Mallampati: II  TM Distance: >3 FB     Dental  (+) Poor Dentition, Missing, Chipped,    Pulmonary shortness of breath and with exertion, former smoker,    breath sounds clear to auscultation       Cardiovascular hypertension, Pt. on medications and Pt. on home beta blockers + Peripheral Vascular Disease, +CHF, + Orthopnea and + DOE   Rhythm:Regular Rate:Normal     Neuro/Psych    GI/Hepatic   Endo/Other  diabetes, Type 2  Renal/GU      Musculoskeletal   Abdominal   Peds  Hematology   Anesthesia Other Findings   Reproductive/Obstetrics                             Anesthesia Physical Anesthesia Plan  ASA: III  Anesthesia Plan: MAC   Post-op Pain Management:    Induction: Intravenous  Airway Management Planned: Nasal Cannula  Additional Equipment:   Intra-op Plan:   Post-operative Plan:   Informed Consent: I have reviewed the patients History and Physical, chart, labs and discussed the procedure including the risks, benefits and alternatives for the proposed anesthesia with the patient or authorized representative who has indicated his/her understanding and acceptance.     Plan Discussed with:   Anesthesia Plan Comments:         Anesthesia Quick Evaluation

## 2016-05-20 NOTE — Discharge Instructions (Signed)
°  °          [facility] Instructions °[address]- [place] [address] °    ° °1. Avoid closing eyes tightly. One often closes the eye tightly when laughing, talking, sneezing, coughing or if they feel irritated. At these times, you should be careful not to close your eyes tightly. ° °2. Instill eye drops as instructed. To instill drops in your eye, open it, look up and have someone gently pull the lower lid down and instill a couple of drops inside the lower lid. ° °3. Do not touch upper lid. ° °4. Take Advil or Tylenol for pain. ° °5. You may use either eye for near work, such as reading or sewing and you may watch television. ° °6. You may have your hair done at the beauty parlor at any time. ° °7. Wear dark glasses with or without your own glasses if you are in bright light. ° °8. Call our office at [phone] or [phone] if you have sharp pain in your eye or unusual symptoms. ° °9.  FOLLOW UP WITH DR. SHAPIRO TODAY IN HIS [place] OFFICE AT 2:45pm. ° °  °I have received a copy of the above instructions and will follow them.  ° ° ° °IF YOU ARE IN IMMEDIATE DANGER CALL 911! ° °It is important for you to keep your follow-up appointment with your physician after discharge, OR, for you /your caregiver to make a follow-up appointment with your physician / medical provider after discharge. ° °Show these instructions to the next healthcare provider you see. °PATIENT INSTRUCTIONS °POST-ANESTHESIA ° °IMMEDIATELY FOLLOWING SURGERY:  Do not drive or operate machinery for the first twenty four hours after surgery.  Do not make any important decisions for twenty four hours after surgery or while taking narcotic pain medications or sedatives.  If you develop intractable nausea and vomiting or a severe headache please notify your doctor immediately. ° °FOLLOW-UP:  Please make an appointment with your surgeon as instructed. You do not need to follow up with anesthesia unless  specifically instructed to do so. ° °WOUND CARE INSTRUCTIONS (if applicable):  Keep a dry clean dressing on the anesthesia/puncture wound site if there is drainage.  Once the wound has quit draining you may leave it open to air.  Generally you should leave the bandage intact for twenty four hours unless there is drainage.  If the epidural site drains for more than 36-48 hours please call the anesthesia department. ° °QUESTIONS?:  Please feel free to call your physician or the hospital operator if you have any questions, and they will be happy to assist you.    ° ° ° °

## 2016-05-20 NOTE — Op Note (Signed)
Patient brought to the operating room and prepped and draped in the usual manner.  Lid speculum inserted in right eye.  Stab incision made at the twelve o'clock position.  Provisc instilled in the anterior chamber.   A 2.4 mm. Stab incision was made temporally.  An anterior capsulotomy was done with a bent 25 gauge needle.  The nucleus was hydrodissected.  The Phaco tip was inserted in the anterior chamber and the nucleus was emulsified.  CDE was 4.33.  The cortical material was then removed with the I and A tip.  Posterior capsule was the polished.  The anterior chamber was deepened with Provisc.  A 20.5 Diopter Alcon [id number] IOL was then inserted in the capsular bag.  Provisc was then removed with the I and A tip.  The wound was then hydrated.  Patient sent to the Recovery Room in good condition with follow up in my office.  Preoperative Diagnosis:  Nuclear and PSC Cataract OD Postoperative Diagnosis:  Same Procedure name: Kelman Phacoemulsification OD with IOL

## 2016-05-20 NOTE — Anesthesia Postprocedure Evaluation (Signed)
Anesthesia Post Note  Patient: Derek Mckenzie  Procedure(s) Performed: Procedure(s) (LRB): CATARACT EXTRACTION PHACO AND INTRAOCULAR LENS PLACEMENT (IOC) (Right)  Patient location during evaluation: Short Stay Anesthesia Type: MAC Level of consciousness: awake and alert and oriented Pain management: pain level controlled Vital Signs Assessment: post-procedure vital signs reviewed and stable Respiratory status: spontaneous breathing Cardiovascular status: stable Postop Assessment: no signs of nausea or vomiting Anesthetic complications: no    Last Vitals:  Filed Vitals:   05/20/16 0955 05/20/16 0957  BP: 162/83   Pulse:    Temp:    Resp: 16 18    Last Pain: There were no vitals filed for this visit.               Nylee Barbuto A

## 2016-05-20 NOTE — Anesthesia Procedure Notes (Signed)
Procedure Name: MAC Date/Time: 05/20/2016 10:00 AM Performed by: Andree Elk, Amerie Beaumont A Pre-anesthesia Checklist: Timeout performed, Patient identified, Emergency Drugs available, Suction available and Patient being monitored Oxygen Delivery Method: Nasal cannula

## 2016-05-21 ENCOUNTER — Encounter (HOSPITAL_COMMUNITY): Payer: Self-pay | Admitting: Ophthalmology

## 2016-07-04 IMAGING — CR DG CHEST 2V
2 series · 2 of 2 positions shown · non-contrast
Comparison: December 12, 2012

CLINICAL DATA: Shortness of breath for 2 days

EXAM:
CHEST  2 VIEW

[chest pa]
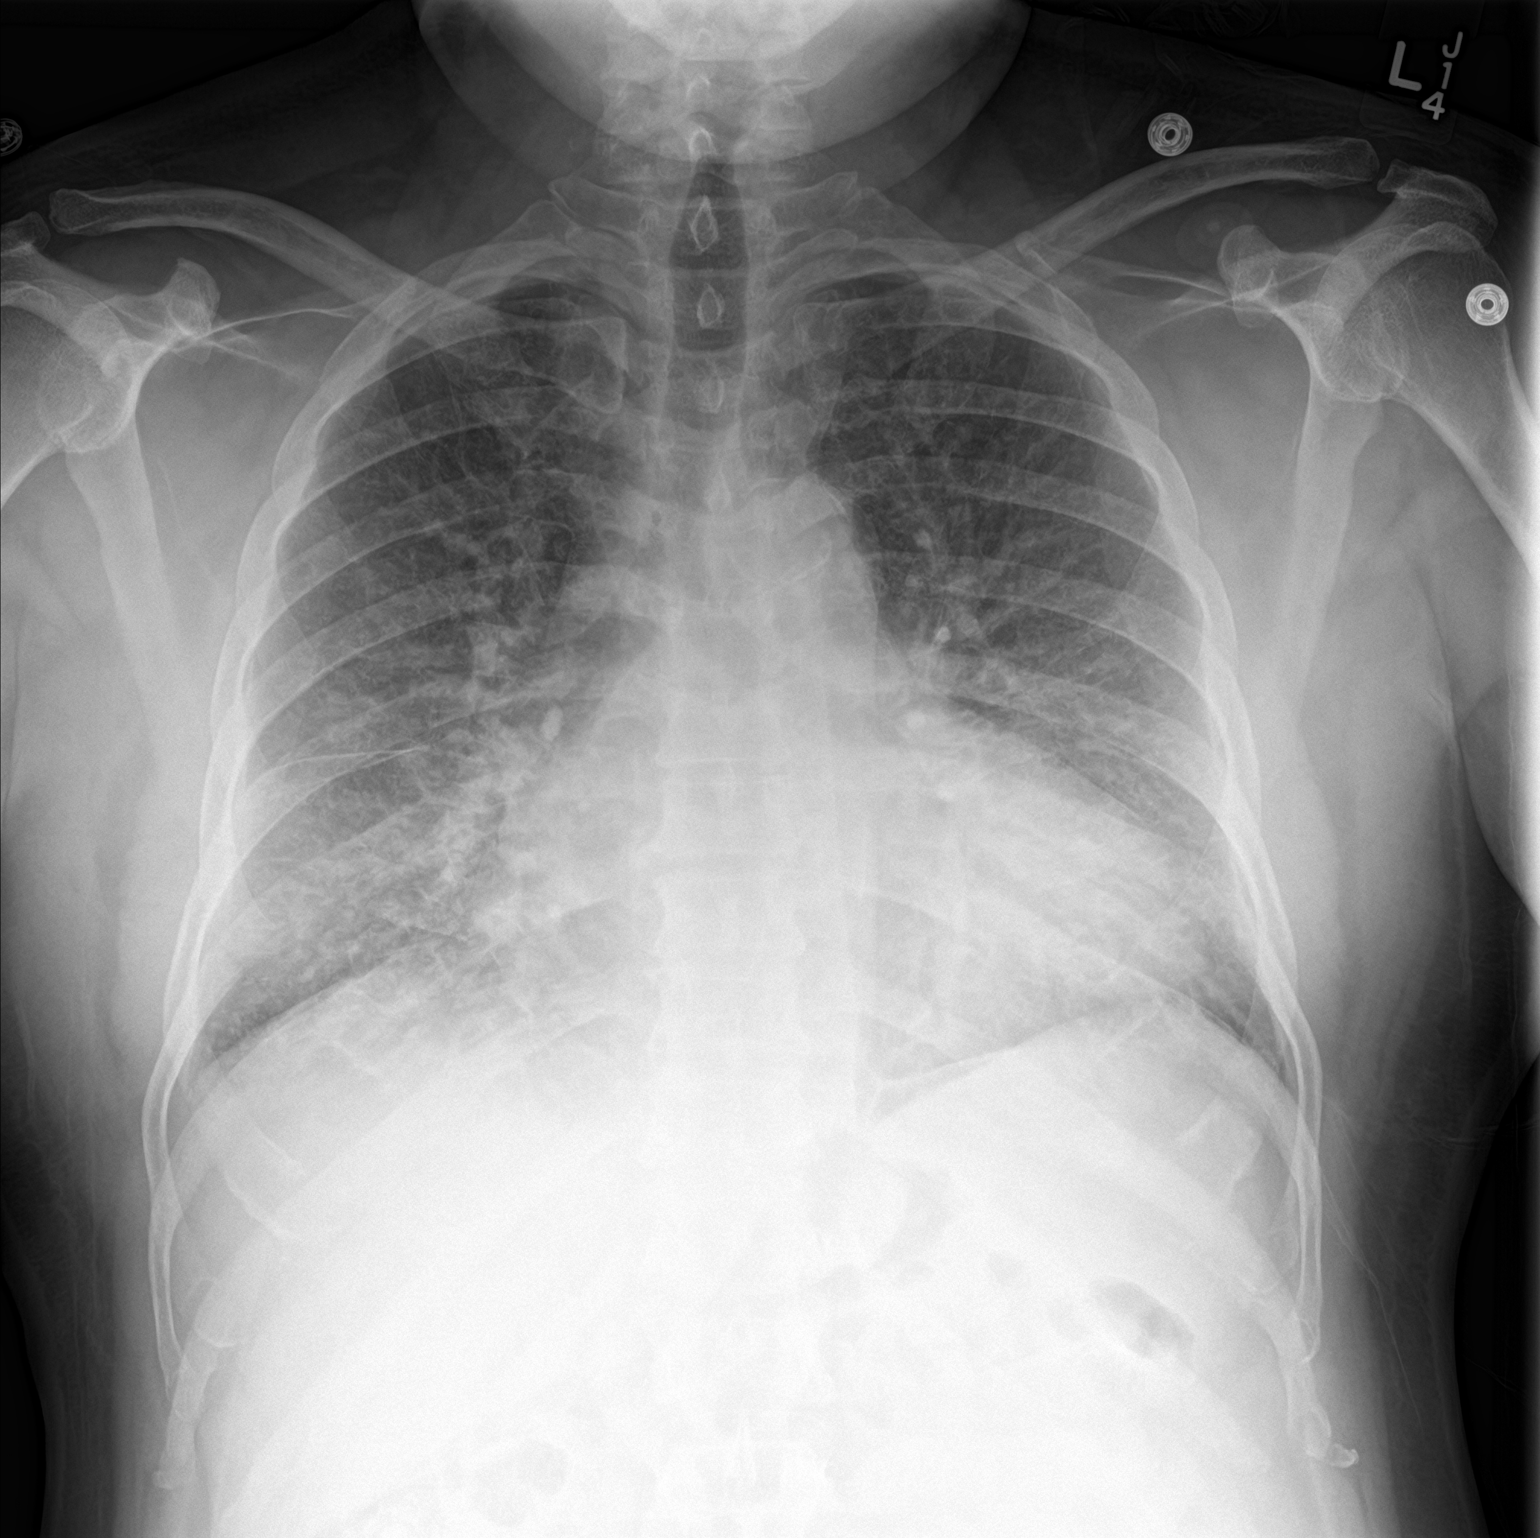

[chest lat]
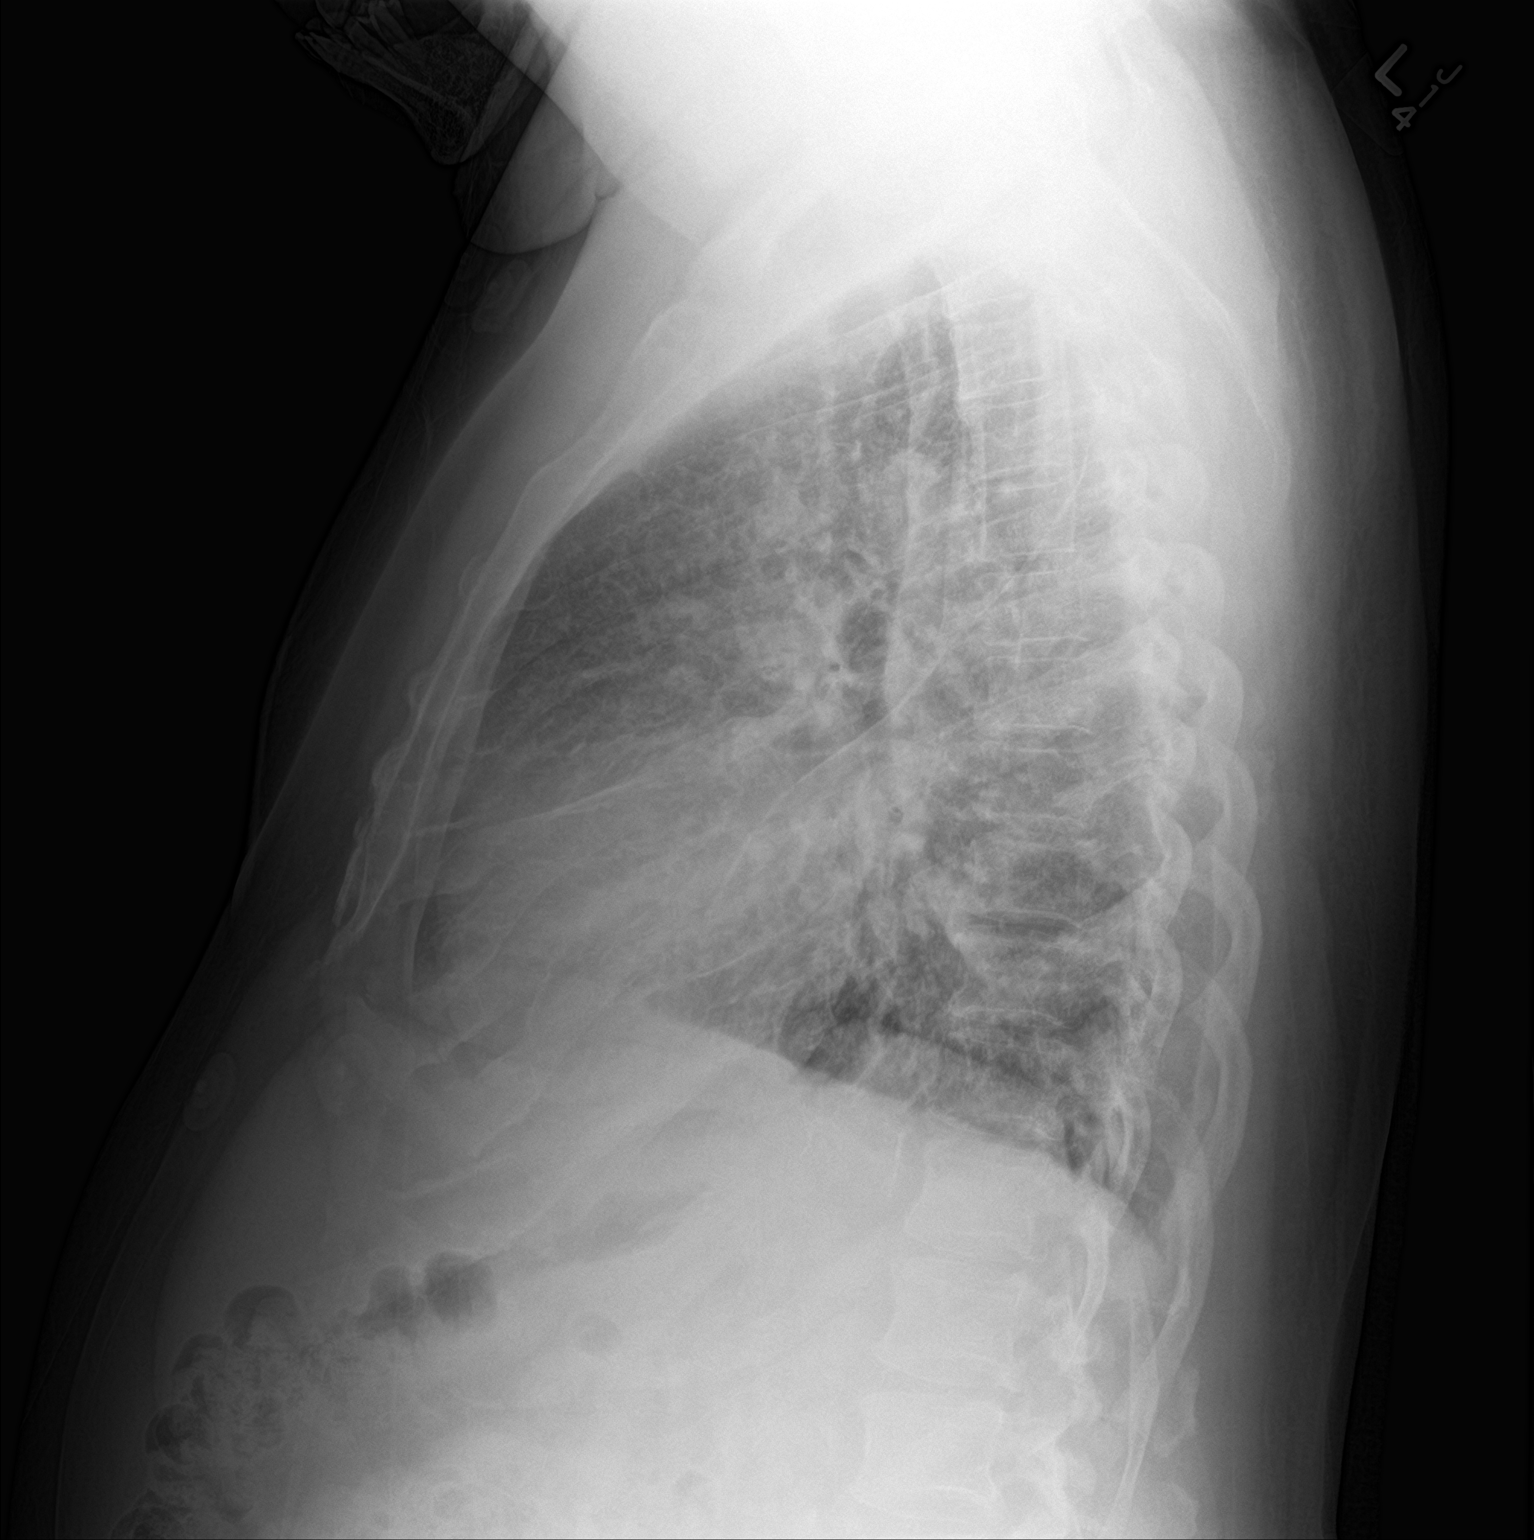

[2 of 2 positions shown; findings below may reference images not displayed]

FINDINGS: There is mild interstitial edema in the mid lower lung zones. There
is no airspace consolidation. There is cardiomegaly with mild
pulmonary venous hypertension. No adenopathy. There is
atherosclerotic calcification in the aorta. No bone lesions.
IMPRESSION: Evidence of a degree of congestive heart failure. No airspace
consolidation.

## 2016-07-30 ENCOUNTER — Other Ambulatory Visit (HOSPITAL_COMMUNITY): Payer: Self-pay | Admitting: Cardiology

## 2016-09-30 DIAGNOSIS — Z961 Presence of intraocular lens: Secondary | ICD-10-CM | POA: Diagnosis not present

## 2016-11-10 ENCOUNTER — Telehealth (HOSPITAL_COMMUNITY): Payer: Self-pay | Admitting: Vascular Surgery

## 2016-11-10 NOTE — Telephone Encounter (Signed)
Left pt message to make f/u appt w/ [facility] 

## 2016-12-06 ENCOUNTER — Other Ambulatory Visit (HOSPITAL_COMMUNITY): Payer: Self-pay | Admitting: Cardiology

## 2016-12-12 ENCOUNTER — Ambulatory Visit (HOSPITAL_COMMUNITY)
Admission: RE | Admit: 2016-12-12 | Discharge: 2016-12-12 | Disposition: A | Discharge status: Home / Self Care | Payer: PPO | Source: Ambulatory Visit | Attending: Cardiology | Admitting: Cardiology

## 2016-12-12 VITALS — BP 154/76 | HR 91 | Wt 188.2 lb

## 2016-12-12 DIAGNOSIS — R531 Weakness: Secondary | ICD-10-CM | POA: Insufficient documentation

## 2016-12-12 DIAGNOSIS — E1122 Type 2 diabetes mellitus with diabetic chronic kidney disease: Secondary | ICD-10-CM | POA: Insufficient documentation

## 2016-12-12 DIAGNOSIS — N189 Chronic kidney disease, unspecified: Secondary | ICD-10-CM | POA: Insufficient documentation

## 2016-12-12 DIAGNOSIS — I130 Hypertensive heart and chronic kidney disease with heart failure and stage 1 through stage 4 chronic kidney disease, or unspecified chronic kidney disease: Secondary | ICD-10-CM | POA: Insufficient documentation

## 2016-12-12 DIAGNOSIS — Z87891 Personal history of nicotine dependence: Secondary | ICD-10-CM | POA: Diagnosis not present

## 2016-12-12 DIAGNOSIS — Z79899 Other long term (current) drug therapy: Secondary | ICD-10-CM | POA: Insufficient documentation

## 2016-12-12 DIAGNOSIS — Z794 Long term (current) use of insulin: Secondary | ICD-10-CM | POA: Insufficient documentation

## 2016-12-12 DIAGNOSIS — Z7982 Long term (current) use of aspirin: Secondary | ICD-10-CM | POA: Insufficient documentation

## 2016-12-12 DIAGNOSIS — E785 Hyperlipidemia, unspecified: Secondary | ICD-10-CM | POA: Insufficient documentation

## 2016-12-12 DIAGNOSIS — I5022 Chronic systolic (congestive) heart failure: Secondary | ICD-10-CM

## 2016-12-12 DIAGNOSIS — E1151 Type 2 diabetes mellitus with diabetic peripheral angiopathy without gangrene: Secondary | ICD-10-CM | POA: Insufficient documentation

## 2016-12-12 LAB — CBC
HEMATOCRIT: 36.6 % — AB (ref 39.0–52.0)
HEMOGLOBIN: 12.4 g/dL — AB (ref 13.0–17.0)
MCH: 29.5 pg (ref 26.0–34.0)
MCHC: 33.9 g/dL (ref 30.0–36.0)
MCV: 86.9 fL (ref 78.0–100.0)
Platelets: 353 10*3/uL (ref 150–400)
RBC: 4.21 MIL/uL — AB (ref 4.22–5.81)
RDW: 13.4 % (ref 11.5–15.5)
WBC: 10.1 10*3/uL (ref 4.0–10.5)

## 2016-12-12 LAB — BASIC METABOLIC PANEL
ANION GAP: 8 (ref 5–15)
BUN: 15 mg/dL (ref 6–20)
CALCIUM: 9.5 mg/dL (ref 8.9–10.3)
CO2: 25 mmol/L (ref 22–32)
Chloride: 102 mmol/L (ref 101–111)
Creatinine, Ser: 1.52 mg/dL — ABNORMAL HIGH (ref 0.61–1.24)
GFR, EST AFRICAN AMERICAN: 55 mL/min — AB (ref >60)
GFR, EST NON AFRICAN AMERICAN: 48 mL/min — AB (ref >60)
GLUCOSE: 398 mg/dL — AB (ref 65–99)
Potassium: 5.4 mmol/L — ABNORMAL HIGH (ref 3.5–5.1)
Sodium: 135 mmol/L (ref 135–145)

## 2016-12-12 LAB — BRAIN NATRIURETIC PEPTIDE: B Natriuretic Peptide: 51 pg/mL (ref 0.0–100.0)

## 2016-12-12 LAB — LIPID PANEL
Cholesterol: 264 mg/dL — ABNORMAL HIGH (ref 0–200)
HDL: 24 mg/dL — ABNORMAL LOW (ref >40)
LDL CALC: UNDETERMINED mg/dL (ref 0–99)
TRIGLYCERIDES: 448 mg/dL — AB (ref <150)
Total CHOL/HDL Ratio: 11 RATIO
VLDL: UNDETERMINED mg/dL (ref 0–40)

## 2016-12-12 MED ORDER — ASPIRIN EC 81 MG PO TBEC: 81.0000 mg | DELAYED_RELEASE_TABLET | Freq: Every day | ORAL | 3 refills | Status: DC

## 2016-12-12 MED ORDER — ISOSORB DINITRATE-HYDRALAZINE 20-37.5 MG PO TABS: 1.0000 | ORAL_TABLET | Freq: Three times a day (TID) | ORAL | 3 refills | Status: DC

## 2016-12-12 NOTE — Patient Instructions (Signed)
START taking Aspirin 81 mg (1 Tab) Once Daily  START taking [name] (1 Tab) three times daily with meals  ECHO and Artrial Dopplers are scheduled for December 27th, please come to our office at 1:40 for your appointment.    Follow up appointment is scheduled for Monday January 15th at 11:20, [place] Code is 5000

## 2016-12-13 NOTE — Progress Notes (Signed)
Patient ID: Derek Mckenzie, male   DOB: Nov 18, 1956, 60 y.o.   MRN: [id number] PCP: Dr. Woody Seller HF Cardiology: Dr. Aundra Dubin  60 yo with history of HTN, DM, PAD, CKD and recently diagnosed systolic CHF presents for CHF clinic followup.  He has had a long history of difficult-to-control HTN.  I saw him about a year ago and adjusted his meds.  I had planned on a cath to assess for CAD as cause of cardiomyopathy, but he was lost to followup for a year.  He returns today for re-evaluation.    Very hard of hearing.  Seems to be doing well.  BP still high.  No chest pain.  No dyspnea walking on flat ground, mildly dyspneic with stairs.   He has known PAD from prior peripheral arterial dopplers in 2012.  His legs feel weak with moderate exertion.  No pain.  No pedal ulcers.    Labs (11/16): LDL 109, HDL 26, K 4.1, creatinine 1.42, BNP 197, TSH normal Labs (3/17): K 4.2, creatinine 1.73  PMH:  1. Type II diabetes. 2. HTN: x years, poorly controlled. 11/16 renal artery dopplers with no significant stenosis.  3. Deafness 4. PAD: 2012 peripheral arterial dopplers with right distal SFA stenosis and left mid SFA stenosis.  5. Sleep study negative for OSA 6. Chronic systolic CHF: Echo ([id number]) with EF 30-35%, moderate LVH.  7. CKD 8. Hyperlipidemia  FH: No cardiac disease that he knows of.  +HTN.   Social History   Social History  . Marital status: Single    Spouse name: N/A  . Number of children: N/A  . Years of education: N/A   Occupational History  . Not on file.   Social History Main Topics  . Smoking status: Former Smoker    Packs/day: 0.50    Years: 35.00    Types: Cigarettes  . Smokeless tobacco: Never Used     Comment: "quit smoking in ~ 2007"  . Alcohol use No  . Drug use: No  . Sexual activity: No   Other Topics Concern  . Not on file   Social History Narrative  . No narrative on file   ROS: All systems reviewed and negative except as per HPI.   Current Outpatient  Prescriptions  Medication Sig Dispense Refill  . amLODipine (NORVASC) 10 MG tablet Take 1 tablet (10 mg total) by mouth daily. 30 tablet 0  . atorvastatin (LIPITOR) 20 MG tablet Take 20 mg by mouth daily.    . carvedilol (COREG) 25 MG tablet Take 25 mg by mouth 2 (two) times daily with a meal.    . folic acid (FOLVITE) 1 MG tablet Take 1 mg by mouth daily.    . insulin NPH-regular Human (NOVOLIN 70/30) (70-30) 100 UNIT/ML injection Inject 40 Units into the skin 2 (two) times daily with a meal. 10 mL 2  . latanoprost (XALATAN) 0.005 % ophthalmic solution Place 1 drop into both eyes at bedtime.    . sacubitril-valsartan (ENTRESTO) 49-51 MG Take 1 tablet by mouth 2 (two) times daily. 60 tablet 11  . spironolactone (ALDACTONE) 50 MG tablet Take 1 tablet (50 mg total) by mouth daily. 60 tablet 0  . vitamin B-12 (CYANOCOBALAMIN) 500 MCG tablet Take 500 mcg by mouth daily.    Marland Kitchen aspirin EC 81 MG tablet Take 1 tablet (81 mg total) by mouth daily. 30 tablet 3  . isosorbide-hydrALAZINE ([name]) 20-37.5 MG tablet Take 1 tablet by mouth 3 (three) times daily. 90 tablet 3  No current facility-administered medications for this encounter.    BP (!) 154/76   Pulse 91   Wt 188 lb 4 oz (85.4 kg)   SpO2 94%   BMI 31.33 kg/m  General: NAD Neck: No JVD, no thyromegaly or thyroid nodule.  Lungs: Clear to auscultation bilaterally with normal respiratory effort. CV: Nondisplaced PMI.  Heart regular S1/S2, +S4, no murmur.  1+ ankle edema.  No carotid bruit. Unable to palpate pedal pulses.   Abdomen: Soft, nontender, no hepatosplenomegaly, no distention.  Skin: Intact without lesions or rashes.  Neurologic: Alert and oriented x 3.  Psych: Normal affect. Extremities: No clubbing or cyanosis.  HEENT: Normal.   Assessment/Plan: 1. Chronic systolic CHF: Echo [id number] with EF 30-35%.  Cause of cardiomyopathy is uncertain.  It is possible that CMP is due to long-standing HTN.  However, he has a lot of risk factors for  CAD: known PAD, HTN, hyperlipidemia, diabetes.  I think that we need to rule out coronary disease as a cause of cardiomyopathy.  NYHA class II, not volume overloaded on exam.  - I will get a repeat echo next week; if EF remains low, I will arrange for left heart cath to assess for significant CAD.  - Continue spironolactone, Entresto, and Coreg.   - Start [name] 1 tab tid. - BMET/BNP today.  2. HTN: BP still not adequately controlled.  Renal arterial dopplers in 11/16 did not show evidence for renal artery stenosis.  Adding [name] as above.    3. PAD: Significant leg weakness with ambulation, known PAD.  I will arrange for peripheral arterial dopplers (he did not get after last appointment).  He should start ASA 81.  4. Hyperlipidemia: Goal LDL < 70.   Check lipids today.   Loralie Champagne 12/13/2016

## 2016-12-16 ENCOUNTER — Telehealth (HOSPITAL_COMMUNITY): Payer: Self-pay | Admitting: *Deleted

## 2016-12-16 DIAGNOSIS — E781 Pure hyperglyceridemia: Secondary | ICD-10-CM

## 2016-12-16 MED ORDER — FENOFIBRATE 145 MG PO TABS: 145.0000 mg | ORAL_TABLET | Freq: Every day | ORAL | 3 refills | Status: DC

## 2016-12-16 MED ORDER — SPIRONOLACTONE 50 MG PO TABS: 25.0000 mg | ORAL_TABLET | Freq: Every day | ORAL | 0 refills | Status: DC

## 2016-12-16 NOTE — Telephone Encounter (Signed)
Status:  Final result Visible to patient:  No (Not Released) Dx:  Chronic systolic CHF (congestive hear...  Notes Recorded by Kennieth Rad, RN on 12/16/2016 at 5:08 PM EST Spoke with [name] for patient and she is aware of potassium and agrees to decrease Spironolactone to 25 mg Daily. They are also aware of the lipids and he will start taking Fenofibrate 148 mg Daily. Will add him to lab schedule for February 19th. ------  Notes Recorded by Harvie Junior, CMA on 12/16/2016 at 3:00 PM EST Left vm for patient to call for lab results. ------  Notes Recorded by Harvie Junior, CMA on 12/15/2016 at 3:51 PM EST No answer. Will try patient again tomorrow. ------  Notes Recorded by Larey Dresser, MD on 12/12/2016 at 5:01 PM EST Triglycerides really high. Add fenofibrate 148 mg daily. Lipids again in 2 months. ------  Notes Recorded by Larey Dresser, MD on 12/12/2016 at 5:00 PM EST K is high, decrease spironolactone to 25 mg daily.

## 2016-12-24 ENCOUNTER — Ambulatory Visit (HOSPITAL_BASED_OUTPATIENT_CLINIC_OR_DEPARTMENT_OTHER)
Admission: RE | Admit: 2016-12-24 | Discharge: 2016-12-24 | Disposition: A | Discharge status: Home / Self Care | Payer: PPO | Source: Ambulatory Visit

## 2016-12-24 ENCOUNTER — Ambulatory Visit (HOSPITAL_BASED_OUTPATIENT_CLINIC_OR_DEPARTMENT_OTHER)
Admission: RE | Admit: 2016-12-24 | Discharge: 2016-12-24 | Disposition: A | Discharge status: Home / Self Care | Payer: PPO | Source: Ambulatory Visit | Attending: Cardiology | Admitting: Cardiology

## 2016-12-24 ENCOUNTER — Ambulatory Visit (HOSPITAL_COMMUNITY)
Admission: RE | Admit: 2016-12-24 | Discharge: 2016-12-24 | Disposition: A | Discharge status: Home / Self Care | Payer: PPO | Source: Ambulatory Visit | Attending: Cardiology | Admitting: Cardiology

## 2016-12-24 ENCOUNTER — Ambulatory Visit (HOSPITAL_COMMUNITY): Admission: RE | Admit: 2016-12-24 | Payer: PPO | Source: Ambulatory Visit

## 2016-12-24 DIAGNOSIS — I5022 Chronic systolic (congestive) heart failure: Secondary | ICD-10-CM

## 2016-12-24 DIAGNOSIS — I739 Peripheral vascular disease, unspecified: Secondary | ICD-10-CM | POA: Insufficient documentation

## 2016-12-24 DIAGNOSIS — I517 Cardiomegaly: Secondary | ICD-10-CM

## 2016-12-24 DIAGNOSIS — R938 Abnormal findings on diagnostic imaging of other specified body structures: Secondary | ICD-10-CM

## 2016-12-24 DIAGNOSIS — I509 Heart failure, unspecified: Secondary | ICD-10-CM | POA: Diagnosis not present

## 2016-12-24 LAB — ECHOCARDIOGRAM COMPLETE
CHL CUP DOP CALC LVOT VTI: 19.1 cm
EERAT: 11.78
FS: 27 % — AB (ref 28–44)
IV/PV OW: 1.04
LA diam end sys: 38 mm
LA vol: 50.4 mL
LADIAMINDEX: 1.89 cm/m2
LASIZE: 38 mm
LAVOLA4C: 54.4 mL
LAVOLIN: 25.1 mL/m2
LV E/e' medial: 11.78
LV E/e'average: 11.78
LV PW d: 12.5 mm — AB (ref 0.6–1.1)
LV e' LATERAL: 7.29 cm/s
LVOT area: 2.84 cm2
LVOT diameter: 19 mm
LVOTPV: 79.1 cm/s
LVOTSV: 54 mL
MV Peak grad: 3 mmHg
MVPKAVEL: 119 m/s
MVPKEVEL: 85.9 m/s
RV LATERAL S' VELOCITY: 13.1 cm/s
RV TAPSE: 25.4 mm
TDI e' lateral: 7.29
TDI e' medial: 5.87

## 2016-12-24 LAB — VAS US LOWER EXTREMITY ARTERIAL DUPLEX
LPOPDPSV: -60 cm/s
LPTIBDISTSYS: 57 cm/s
LPTIBMIDSYS: 61 cm/s
LPTIBPROXSYS: 63 cm/s
Left ant tibial distal sys: -42 cm/s
Left popliteal prox sys PSV: 54 cm/s
Left super femoral dist sys PSV: -137 cm/s
Left super femoral mid sys PSV: -69 cm/s
Left super femoral prox sys PSV: 88 cm/s
RATIBDISTSYS: 19 cm/s
RIGHT POST TIB DIST SYS: 39 cm/s
RIGHT POST TIB MID SYS: 48 cm/s
RPOPDPSV: -23 cm/s
RPTIBPSV: 35 cm/s
RSFDPSV: -29 cm/s
RSFMPSV: -13 cm/s
RSFPPSV: -33 cm/s
Right popliteal prox sys PSV: 34 cm/s

## 2016-12-24 NOTE — Progress Notes (Signed)
VASCULAR LAB PRELIMINARY  Bilateral lower extremity arterial duplex competed  Duplex scan of the right lower extremity revealed moderate to severe heterogeneous plaque throughout. The Doppler waveforms and velocities were diminished throughout. Results indicate no evidence of total occlusion. There is evidence pointing to a possible iliac obstruction as well as lower extremity disease.  Left Duplex scan revealed mild to moderate heterogenous plaque throughout. There distal superficial femoral artery was difficult to image due to the depth. Cannot rule out a possible significant obstruction at that site.     Yu Peggs, RVS 12/24/2016, 5:39 PM

## 2016-12-24 NOTE — Progress Notes (Signed)
VASCULAR LAB PRELIMINARY  ARTERIAL  ABI completed:    RIGHT    LEFT    PRESSURE WAVEFORM  PRESSURE WAVEFORM  BRACHIAL 170 Triphasic BRACHIAL 170 Triphasic  AT 85 Dampened Monophasic AT 102 Monophasic  PT 75 Dampened Monophasic PT 120 Biphasic    RIGHT LEFT  ABI 0.44 0.71   Right ABI indicates a severe reduction in arterial flow with abnormal Doppler waveforms at rest. Left ABIs indicate a moderate reduction in arterial flow with abnormal Doppler waveforms at rest.  Kym Scannell, RVS 12/24/2016, 5:15 PM

## 2016-12-24 NOTE — Progress Notes (Signed)
  Echocardiogram 2D Echocardiogram has been performed.  Johny Chess 12/24/2016, 2:25 PM

## 2017-01-05 ENCOUNTER — Telehealth (HOSPITAL_COMMUNITY): Payer: Self-pay | Admitting: *Deleted

## 2017-01-05 DIAGNOSIS — I739 Peripheral vascular disease, unspecified: Secondary | ICD-10-CM

## 2017-01-05 NOTE — Telephone Encounter (Signed)
Notes Recorded by Scarlette Calico, RN on 01/05/2017 at 4:17 PM EST Pt's friend [name] aware and agreeable, referral placed ------  Notes Recorded by Scarlette Calico, RN on 01/01/2017 at 2:18 PM EST Left message to call back ------  Notes Recorded by Larey Dresser, MD on 12/25/2016 at 10:34 PM EST Significant bilateral PAD. I would suggest PV evaluation with Dr. Fletcher Anon or Dr. Gwenlyn Found.

## 2017-01-12 ENCOUNTER — Ambulatory Visit (HOSPITAL_COMMUNITY)
Admission: RE | Admit: 2017-01-12 | Discharge: 2017-01-12 | Disposition: A | Discharge status: Home / Self Care | Payer: PPO | Source: Ambulatory Visit | Attending: Cardiology | Admitting: Cardiology

## 2017-01-12 VITALS — BP 134/74 | HR 84 | Wt 191.8 lb

## 2017-01-12 DIAGNOSIS — R531 Weakness: Secondary | ICD-10-CM | POA: Insufficient documentation

## 2017-01-12 DIAGNOSIS — Z87891 Personal history of nicotine dependence: Secondary | ICD-10-CM | POA: Diagnosis not present

## 2017-01-12 DIAGNOSIS — Z7982 Long term (current) use of aspirin: Secondary | ICD-10-CM | POA: Diagnosis not present

## 2017-01-12 DIAGNOSIS — I5022 Chronic systolic (congestive) heart failure: Secondary | ICD-10-CM

## 2017-01-12 DIAGNOSIS — E1122 Type 2 diabetes mellitus with diabetic chronic kidney disease: Secondary | ICD-10-CM | POA: Insufficient documentation

## 2017-01-12 DIAGNOSIS — Z79899 Other long term (current) drug therapy: Secondary | ICD-10-CM | POA: Insufficient documentation

## 2017-01-12 DIAGNOSIS — I739 Peripheral vascular disease, unspecified: Secondary | ICD-10-CM | POA: Diagnosis not present

## 2017-01-12 DIAGNOSIS — H9190 Unspecified hearing loss, unspecified ear: Secondary | ICD-10-CM | POA: Diagnosis not present

## 2017-01-12 DIAGNOSIS — Z794 Long term (current) use of insulin: Secondary | ICD-10-CM | POA: Insufficient documentation

## 2017-01-12 DIAGNOSIS — N189 Chronic kidney disease, unspecified: Secondary | ICD-10-CM | POA: Diagnosis not present

## 2017-01-12 DIAGNOSIS — I130 Hypertensive heart and chronic kidney disease with heart failure and stage 1 through stage 4 chronic kidney disease, or unspecified chronic kidney disease: Secondary | ICD-10-CM | POA: Insufficient documentation

## 2017-01-12 DIAGNOSIS — I10 Essential (primary) hypertension: Secondary | ICD-10-CM | POA: Diagnosis not present

## 2017-01-12 DIAGNOSIS — E785 Hyperlipidemia, unspecified: Secondary | ICD-10-CM | POA: Insufficient documentation

## 2017-01-12 LAB — BASIC METABOLIC PANEL
ANION GAP: 7 (ref 5–15)
BUN: 25 mg/dL — ABNORMAL HIGH (ref 6–20)
CALCIUM: 9.2 mg/dL (ref 8.9–10.3)
CO2: 25 mmol/L (ref 22–32)
CREATININE: 1.68 mg/dL — AB (ref 0.61–1.24)
Chloride: 106 mmol/L (ref 101–111)
GFR calc Af Amer: 49 mL/min — ABNORMAL LOW (ref >60)
GFR, EST NON AFRICAN AMERICAN: 42 mL/min — AB (ref >60)
GLUCOSE: 176 mg/dL — AB (ref 65–99)
Potassium: 5.1 mmol/L (ref 3.5–5.1)
Sodium: 138 mmol/L (ref 135–145)

## 2017-01-12 NOTE — Patient Instructions (Signed)
Lab today, we will call you if labs are abnormal  We will contact you in 4 months to schedule your next appointment.

## 2017-01-12 NOTE — Progress Notes (Signed)
Patient ID: Derek Mckenzie, male   DOB: [birth year], 61 y.o.   MRN: [id number] PCP: Dr. Woody Seller HF Cardiology: Dr. Aundra Dubin  61 yo with history of HTN, DM, PAD, CKD and recently diagnosed systolic CHF presents for CHF clinic followup.  He has had a long history of difficult-to-control HTN.  I saw him about a year ago and adjusted his meds.  I had planned on a cath to assess for CAD as cause of cardiomyopathy, but he was lost to followup for a year.  He returned in 12/17 and I obtained a repeat echo that showed improvement in EF to 60-65%.    Very hard of hearing.  Seems to be doing well.  BP now controlled.  No chest pain.  No dyspnea walking on flat ground, mildly dyspneic with stairs. Walks in [place] of exercise.   He has known PAD, significant on 12/17 peripheral arterial dopplers.  His legs feel weak with moderate exertion such as walking about 15 minutes.  No pain.  No pedal ulcers.    Labs (11/16): LDL 109, HDL 26, K 4.1, creatinine 1.42, BNP 197, TSH normal Labs (3/17): K 4.2, creatinine 1.73 Labs (12/17): K 5.4, creatinine 1.5, TGs 448, unable to calculate LDL, BNP 51, hgb 12.4  PMH:  1. Type II diabetes. 2. HTN: x years, poorly controlled. 11/16 renal artery dopplers with no significant stenosis.  3. Deafness 4. PAD: 2012 peripheral arterial dopplers with right distal SFA stenosis and left mid SFA stenosis.  - Peripheral arterial dopplers (12/17): Probable right iliac obstruction and distal left SFA obstruction.  5. Sleep study negative for OSA 6. Chronic systolic CHF: Echo ([id number]) with EF 30-35%, moderate LVH.  Possible hypertensive cardiomyopathy.  - Echo (12/17): EF 60-65%.  7. CKD 8. Hyperlipidemia  FH: No cardiac disease that he knows of.  +HTN.   Social History   Social History  . Marital status: Single    Spouse name: N/A  . Number of children: N/A  . Years of education: N/A   Occupational History  . Not on file.   Social History Main Topics  . Smoking status:  Former Smoker    Packs/day: 0.50    Years: 35.00    Types: Cigarettes  . Smokeless tobacco: Never Used     Comment: "quit smoking in ~ 2007"  . Alcohol use No  . Drug use: No  . Sexual activity: No   Other Topics Concern  . Not on file   Social History Narrative  . No narrative on file   ROS: All systems reviewed and negative except as per HPI.   Current Outpatient Prescriptions  Medication Sig Dispense Refill  . amLODipine (NORVASC) 10 MG tablet Take 1 tablet (10 mg total) by mouth daily. 30 tablet 0  . aspirin EC 81 MG tablet Take 1 tablet (81 mg total) by mouth daily. 30 tablet 3  . atorvastatin (LIPITOR) 20 MG tablet Take 20 mg by mouth daily.    . carvedilol (COREG) 25 MG tablet Take 25 mg by mouth 2 (two) times daily with a meal.    . fenofibrate (TRICOR) 145 MG tablet Take 1 tablet (145 mg total) by mouth daily. 30 tablet 3  . folic acid (FOLVITE) 1 MG tablet Take 1 mg by mouth daily.    . insulin NPH-regular Human (NOVOLIN 70/30) (70-30) 100 UNIT/ML injection Inject 40 Units into the skin 2 (two) times daily with a meal. 10 mL 2  . isosorbide-hydrALAZINE ([name]) 20-37.5 MG tablet Take  1 tablet by mouth 3 (three) times daily. 90 tablet 3  . latanoprost (XALATAN) 0.005 % ophthalmic solution Place 1 drop into both eyes at bedtime.    . sacubitril-valsartan (ENTRESTO) 49-51 MG Take 1 tablet by mouth 2 (two) times daily. 60 tablet 11  . spironolactone (ALDACTONE) 50 MG tablet Take 0.5 tablets (25 mg total) by mouth daily. 60 tablet 0  . vitamin B-12 (CYANOCOBALAMIN) 500 MCG tablet Take 500 mcg by mouth daily.     No current facility-administered medications for this encounter.    BP 134/74 (BP Location: Left Arm, Patient Position: Sitting, Cuff Size: Normal)   Pulse 84   Wt 191 lb 12 oz (87 kg)   SpO2 96%   BMI 31.91 kg/m  General: NAD Neck: No JVD, no thyromegaly or thyroid nodule.  Lungs: Clear to auscultation bilaterally with normal respiratory effort. CV:  Nondisplaced PMI.  Heart regular S1/S2, +S4, no murmur.  1+ ankle edema on left.  No carotid bruit. Unable to palpate pedal pulses.   Abdomen: Soft, nontender, no hepatosplenomegaly, no distention.  Skin: Intact without lesions or rashes.  Neurologic: Alert and oriented x 3.  Psych: Normal affect. Extremities: No clubbing or cyanosis.  HEENT: Normal.   Assessment/Plan: 1. Chronic systolic CHF: Echo [id number] with EF 30-35%.  Given improvement in EF to 60-65% with BP control, suspect this may have been a hypertensive cardiomyopathy.  NYHA class II, not volume overloaded on exam.  - Hold off on coronary angiography with normalization of EF, no chest pain.  - Continue spironolactone, Entresto, [name], and Coreg.  BMET today.  2. HTN: Renal arterial dopplers in 11/16 did not show evidence for renal artery stenosis.  BP now controlled.    3. PAD: Significant leg weakness with ambulation, significant PAD on noninvasive evaluation in 12/17.   - Continue ASA 81.  - He will be going for evaluation by Dr. Gwenlyn Found.  4. Hyperlipidemia: Triglycerides very high, now on fenofibrate + atorvastatin 20 daily.  Repeat lipids in 2/18.   I will see him back in 4 months.    Loralie Champagne 01/12/2017

## 2017-01-13 ENCOUNTER — Telehealth (HOSPITAL_COMMUNITY): Payer: Self-pay | Admitting: *Deleted

## 2017-01-13 NOTE — Telephone Encounter (Signed)
Notes Recorded by Kennieth Rad, RN on 01/13/2017 at 8:34 AM EST Called and spoke with [name] about patient's labs. Reminded her to tell him to continue a low potassium diet and to make sure he is not taking any potassium supplements. She is agreeable and will make patient aware. ------  Notes Recorded by Larey Dresser, MD on 01/12/2017 at 10:18 PM EST Labs ok, follow low K diet and make sure no K supplement

## 2017-01-19 ENCOUNTER — Telehealth (HOSPITAL_COMMUNITY): Payer: Self-pay | Admitting: Pharmacist

## 2017-01-19 NOTE — Telephone Encounter (Signed)
PAN foundation grant used, I have reapplied for a 2nd grant so that [name] will have another $800 to use toward his copay costs of Entresto and [name]. [employer] was able to run the [place] for no charge to the patient. Hattie Perch (patient's friend) made aware.   Patient Name - Derek Mckenzie DOB - 25-Jul-1956 Member ID - [id number] [place]: [id number] GRP: [id number] PCN: [place] Failure 2nd grant amount - $800  Total remaining balance - $800  Eligibility End Date - 01/28/2017  Claims Submission End Date - 05/28/2017    Doroteo Bradford K. Velva Harman, PharmD, BCPS, CPP Clinical Pharmacist Pager: [phone] Phone: [phone] 01/19/2017 4:00 PM

## 2017-01-21 ENCOUNTER — Ambulatory Visit (INDEPENDENT_AMBULATORY_CARE_PROVIDER_SITE_OTHER): Payer: PPO | Admitting: Cardiovascular Disease

## 2017-01-21 ENCOUNTER — Encounter: Payer: Self-pay | Admitting: Cardiovascular Disease

## 2017-01-21 VITALS — BP 160/74 | HR 84 | Ht 65.0 in | Wt 192.6 lb

## 2017-01-21 DIAGNOSIS — I10 Essential (primary) hypertension: Secondary | ICD-10-CM

## 2017-01-21 DIAGNOSIS — I739 Peripheral vascular disease, unspecified: Secondary | ICD-10-CM

## 2017-01-21 NOTE — Progress Notes (Signed)
01/21/2017 Derek Mckenzie   Nov 23, 1956  [id number]  Primary Physician Derek Chroman, MD Primary Cardiologist: Derek Harp MD Derek Mckenzie  HPI:  Derek Mckenzie is a 61 year old moderately overweight single African-American male father of 3 children referred by Dr. Aundra Mckenzie for peripheral vascular evaluation because of claudication and abnormal Doppler studies. He does have a history of remote tobacco abuse having quit in 2007 having smoked 30 pack years, treated hypertension, diabetes and hyperlipidemia. He has never had a heart attack or stroke. He does complain of some claudication which is not lifestyle limiting. He did have Doppler studies performed oh/27/17 that showed multilevel disease bilaterally. His serum creatinine runs in the 1.6-1.7 range.   Current Outpatient Prescriptions  Medication Sig Dispense Refill  . amLODipine (NORVASC) 10 MG tablet Take 1 tablet (10 mg total) by mouth daily. 30 tablet 0  . aspirin EC 81 MG tablet Take 1 tablet (81 mg total) by mouth daily. 30 tablet 3  . atorvastatin (LIPITOR) 20 MG tablet Take 20 mg by mouth daily.    . carvedilol (COREG) 25 MG tablet Take 25 mg by mouth 2 (two) times daily with a meal.    . fenofibrate (TRICOR) 145 MG tablet Take 1 tablet (145 mg total) by mouth daily. 30 tablet 3  . folic acid (FOLVITE) 1 MG tablet Take 1 mg by mouth daily.    . insulin NPH-regular Human (NOVOLIN 70/30) (70-30) 100 UNIT/ML injection Inject 40 Units into the skin 2 (two) times daily with a meal. 10 mL 2  . isosorbide-hydrALAZINE ([name]) 20-37.5 MG tablet Take 1 tablet by mouth 3 (three) times daily. 90 tablet 3  . latanoprost (XALATAN) 0.005 % ophthalmic solution Place 1 drop into both eyes at bedtime.    . sacubitril-valsartan (ENTRESTO) 49-51 MG Take 1 tablet by mouth 2 (two) times daily. 60 tablet 11  . spironolactone (ALDACTONE) 50 MG tablet Take 0.5 tablets (25 mg total) by mouth daily. 60 tablet 0  . vitamin B-12  (CYANOCOBALAMIN) 500 MCG tablet Take 500 mcg by mouth daily.     No current facility-administered medications for this visit.     Allergies  Allergen Reactions  . Atenolol Shortness Of Breath  . Amlodipine Besy-Benazepril Hcl Other (See Comments)    Headaches    Social History   Social History  . Marital status: Single    Spouse name: N/A  . Number of children: N/A  . Years of education: N/A   Occupational History  . Not on file.   Social History Main Topics  . Smoking status: Former Smoker    Packs/day: 0.50    Years: 35.00    Types: Cigarettes  . Smokeless tobacco: Never Used     Comment: "quit smoking in ~ 2007"  . Alcohol use No  . Drug use: No  . Sexual activity: No   Other Topics Concern  . Not on file   Social History Narrative  . No narrative on file     Review of Systems: General: negative for chills, fever, night sweats or weight changes.  Cardiovascular: negative for chest pain, dyspnea on exertion, edema, orthopnea, palpitations, paroxysmal nocturnal dyspnea or shortness of breath Dermatological: negative for rash Respiratory: negative for cough or wheezing Urologic: negative for hematuria Abdominal: negative for nausea, vomiting, diarrhea, bright red blood per rectum, melena, or hematemesis Neurologic: negative for visual changes, syncope, or dizziness All other systems reviewed and are otherwise negative except as noted above.  Blood pressure (!) 160/74, pulse 84, height 5\' 5"  (1.651 m), weight 192 lb 9.6 oz (87.4 kg).  General appearance: alert and no distress Neck: no adenopathy, no carotid bruit, no JVD, supple, symmetrical, trachea midline and thyroid not enlarged, symmetric, no tenderness/mass/nodules Lungs: clear to auscultation bilaterally Heart: regular rate and rhythm, S1, S2 normal, no murmur, click, rub or gallop Extremities: extremities normal, atraumatic, no cyanosis or edema  EKG sinus rhythm at 84 with septal Q waves and  inferolateral T-wave inversion. I personally reviewed his EKG  ASSESSMENT AND PLAN:   PAD (peripheral artery disease) [facility]) Mr. Derek Mckenzie was referred for peripheral arterial disease by Dr. Aundra Mckenzie . He does have a history of remote tobacco abuse, treated hypertension, hyperlipidemia and diabetes. He had recent Dopplers performed at [facility] 12/24/16 that suggested multilevel disease on both sides. On further questioning he does not appear to have lifestyle limiting claudication. There is no evidence of critical limb ischemia. His serum creatinine is in the 1.6 range making him high risk for angiography. At this point, I recommend conservative therapy. Should his symptoms progress we can discuss angiography potential intervention. I will see him back as needed.      Derek Harp MD FACP,FACC,FAHA, [facility] 01/21/2017 10:22 AM

## 2017-01-21 NOTE — Assessment & Plan Note (Signed)
Mr. Sahm was referred for peripheral arterial disease by Dr. Aundra Dubin . He does have a history of remote tobacco abuse, treated hypertension, hyperlipidemia and diabetes. He had recent Dopplers performed at [facility] 12/24/16 that suggested multilevel disease on both sides. On further questioning he does not appear to have lifestyle limiting claudication. There is no evidence of critical limb ischemia. His serum creatinine is in the 1.6 range making him high risk for angiography. At this point, I recommend conservative therapy. Should his symptoms progress we can discuss angiography potential intervention. I will see him back as needed.

## 2017-01-21 NOTE — Patient Instructions (Signed)
Medication Instructions: Your physician recommends that you continue on your current medications as directed. Please refer to the Current Medication list given to you today.   Follow-Up: Your physician recommends that you schedule a follow-up appointment as needed with Dr. Berry.  If you need a refill on your cardiac medications before your next appointment, please call your pharmacy.  

## 2017-02-06 ENCOUNTER — Telehealth (HOSPITAL_COMMUNITY): Payer: Self-pay | Admitting: Pharmacist

## 2017-02-06 NOTE — Telephone Encounter (Addendum)
Derek Mckenzie was re-enrolled in [employer] foundation so that he will have another $800 toward his Entresto copays through 02/05/18.  ID: [id number] GRP: [id number] PCN: [id number]  2/12 ADDENDUM: Spoke with PharmD at [facility] and updated PAN foundation information. Verified $0 copay.   Derek Mckenzie, PharmD, BCPS, CPP Clinical Pharmacist Pager: [phone] Phone: [phone] 02/06/2017 3:35 PM

## 2017-02-11 ENCOUNTER — Encounter (INDEPENDENT_AMBULATORY_CARE_PROVIDER_SITE_OTHER): Payer: PPO | Admitting: Ophthalmology

## 2017-02-11 DIAGNOSIS — E113513 Type 2 diabetes mellitus with proliferative diabetic retinopathy with macular edema, bilateral: Secondary | ICD-10-CM | POA: Diagnosis not present

## 2017-02-11 DIAGNOSIS — I10 Essential (primary) hypertension: Secondary | ICD-10-CM | POA: Diagnosis not present

## 2017-02-11 DIAGNOSIS — H4311 Vitreous hemorrhage, right eye: Secondary | ICD-10-CM

## 2017-02-11 DIAGNOSIS — H43813 Vitreous degeneration, bilateral: Secondary | ICD-10-CM | POA: Diagnosis not present

## 2017-02-11 DIAGNOSIS — E11311 Type 2 diabetes mellitus with unspecified diabetic retinopathy with macular edema: Secondary | ICD-10-CM

## 2017-02-11 DIAGNOSIS — H35033 Hypertensive retinopathy, bilateral: Secondary | ICD-10-CM

## 2017-02-16 ENCOUNTER — Ambulatory Visit (HOSPITAL_COMMUNITY)
Admission: RE | Admit: 2017-02-16 | Discharge: 2017-02-16 | Disposition: A | Discharge status: Home / Self Care | Payer: PPO | Source: Ambulatory Visit | Attending: Cardiology | Admitting: Cardiology

## 2017-02-16 DIAGNOSIS — E781 Pure hyperglyceridemia: Secondary | ICD-10-CM | POA: Diagnosis not present

## 2017-02-16 LAB — LIPID PANEL
CHOL/HDL RATIO: 6.8 ratio
Cholesterol: 198 mg/dL (ref 0–200)
HDL: 29 mg/dL — ABNORMAL LOW (ref >40)
LDL Cholesterol: 128 mg/dL — ABNORMAL HIGH (ref 0–99)
Triglycerides: 206 mg/dL — ABNORMAL HIGH (ref <150)
VLDL: 41 mg/dL — ABNORMAL HIGH (ref 0–40)

## 2017-02-17 DIAGNOSIS — H401132 Primary open-angle glaucoma, bilateral, moderate stage: Secondary | ICD-10-CM | POA: Diagnosis not present

## 2017-02-17 DIAGNOSIS — H26491 Other secondary cataract, right eye: Secondary | ICD-10-CM | POA: Diagnosis not present

## 2017-02-17 DIAGNOSIS — Z961 Presence of intraocular lens: Secondary | ICD-10-CM | POA: Diagnosis not present

## 2017-02-24 ENCOUNTER — Encounter (HOSPITAL_COMMUNITY)
Admission: RE | Disposition: A | Discharge status: Home / Self Care | Payer: Self-pay | Source: Ambulatory Visit | Attending: Ophthalmology

## 2017-02-24 ENCOUNTER — Ambulatory Visit (HOSPITAL_COMMUNITY)
Admission: RE | Admit: 2017-02-24 | Discharge: 2017-02-24 | Disposition: A | Discharge status: Home / Self Care | Payer: PPO | Source: Ambulatory Visit | Attending: Ophthalmology | Admitting: Ophthalmology

## 2017-02-24 DIAGNOSIS — Z79899 Other long term (current) drug therapy: Secondary | ICD-10-CM | POA: Insufficient documentation

## 2017-02-24 DIAGNOSIS — H2640 Unspecified secondary cataract: Secondary | ICD-10-CM | POA: Diagnosis not present

## 2017-02-24 DIAGNOSIS — Z7982 Long term (current) use of aspirin: Secondary | ICD-10-CM | POA: Insufficient documentation

## 2017-02-24 DIAGNOSIS — Z794 Long term (current) use of insulin: Secondary | ICD-10-CM | POA: Diagnosis not present

## 2017-02-24 DIAGNOSIS — H26491 Other secondary cataract, right eye: Secondary | ICD-10-CM | POA: Diagnosis not present

## 2017-02-24 HISTORY — PX: YAG LASER APPLICATION: SHX6189

## 2017-02-24 SURGERY — TREATMENT, USING YAG LASER
Anesthesia: LOCAL | Laterality: Right

## 2017-02-24 MED ORDER — CYCLOPENTOLATE-PHENYLEPHRINE 0.2-1 % OP SOLN
1.0000 [drp] | OPHTHALMIC | Status: AC
  Administered 2017-02-24 (×3): 1 [drp] via OPHTHALMIC

## 2017-02-24 NOTE — Discharge Instructions (Signed)
Derek Mckenzie  02/24/2017     Instructions    Activity: No Restrictions.   Diet: Resume Diet you were on at home.   Pain Medication: Tylenol if Needed.   CONTACT YOUR DOCTOR IF YOU HAVE PAIN, REDNESS IN YOUR EYE, OR DECREASED VISION.   Follow-up:today at 1 pm with Rutherford Guys, MD.   Dr. Gershon Crane: [phone]    If you find that you cannot contact your physician, but feel that your signs and   Symptoms warrant a physician's attention, call the Emergency Room at   [phone] ext.[phone].

## 2017-02-24 NOTE — Op Note (Signed)
Derek Montoya T. Gershon Crane, MD  Procedure: Yag Capsulotomy  Yag Laser Self Test Completedyes. Procedure: Posterior Capsulotomy, Eye Protection Worn by Staff yes. Laser In Use Sign on Door yes.  Laser: Nd:YAG Spot Size: Fixed Burst Mode: III Power Setting: 3.4 mJ/burst Number of shots: 20 Total energy delivered: 63.36 mJ   The patient tolerated the procedure without difficulty. No complications were encountered.   The patient was discharged home with the instructions to continue all her current glaucoma medications, if any.   Patient instructed to go to office at 0100 for intraocular pressure check.  Patient verbalizes understanding of discharge instructions Yes.  .    Pre-Operative Diagnosis: After-Cataract, obscuring vision, 366.53 OD Post-Operative Diagnosis: After-Cataract, obscuring vision, 366.53 OD Date of Cataract Surgery: Unknown

## 2017-02-24 NOTE — H&P (Signed)
The patient was re examined and there is no change in the patients condition since the original H and P. 

## 2017-02-25 ENCOUNTER — Telehealth (HOSPITAL_COMMUNITY): Payer: Self-pay | Admitting: *Deleted

## 2017-02-25 ENCOUNTER — Encounter (HOSPITAL_COMMUNITY): Payer: Self-pay | Admitting: Ophthalmology

## 2017-02-25 DIAGNOSIS — E785 Hyperlipidemia, unspecified: Secondary | ICD-10-CM

## 2017-02-25 MED ORDER — ATORVASTATIN CALCIUM 40 MG PO TABS: 40.0000 mg | ORAL_TABLET | Freq: Every day | ORAL | 3 refills | Status: DC

## 2017-02-25 NOTE — Telephone Encounter (Signed)
Notes Recorded by Harvie Junior, [place] on 02/25/2017 at 3:15 PM EST pts wife aware of lab results and medication change. ------  Notes Recorded by Harvie Junior, [place] on 02/20/2017 at 1:50 PM EST Left message for pt to call for lab results ------  Notes Recorded by Larey Dresser, MD on 02/20/2017 at 10:36 AM EST Increase atorvastatin to 40 mg daily with goal LDL < 70. Lipids/LFTs in 2 months.    Ref Range & Units 9d ago 73mo ago 66yr ago   Cholesterol 0 - 200 mg/dL 198  264   159    Triglycerides <150 mg/dL 206   <150 mg/dL" class="rz_g [id number]"> 448   <150 mg/dL" class="rz_h [id number]"> 121    HDL >40 mg/dL 29   24   26      Total CHOL/HDL Ratio RATIO 6.8  11.0  6.1    VLDL 0 - 40 mg/dL 41   UNABLE TO CALCULATE IF TRIGL...  24    LDL Cholesterol 0 - 99 mg/dL 128   UNABLE TO CALCULATE IF TRIGL.Marland KitchenMarland KitchenCM  109CM

## 2017-02-25 NOTE — Telephone Encounter (Signed)
-----   Message from Larey Dresser, MD sent at 02/20/2017 10:36 AM EST ----- Increase atorvastatin to 40 mg daily with goal LDL < 70.  Lipids/LFTs in 2 months.

## 2017-04-15 ENCOUNTER — Other Ambulatory Visit: Payer: Self-pay | Admitting: Cardiology

## 2017-04-23 ENCOUNTER — Ambulatory Visit (HOSPITAL_COMMUNITY)
Admission: RE | Admit: 2017-04-23 | Discharge: 2017-04-23 | Disposition: A | Discharge status: Home / Self Care | Payer: PPO | Source: Ambulatory Visit | Attending: Cardiology | Admitting: Cardiology

## 2017-04-23 DIAGNOSIS — E785 Hyperlipidemia, unspecified: Secondary | ICD-10-CM | POA: Diagnosis not present

## 2017-04-23 LAB — HEPATIC FUNCTION PANEL
ALT: 14 U/L — AB (ref 17–63)
AST: 15 U/L (ref 15–41)
Albumin: 3.8 g/dL (ref 3.5–5.0)
Alkaline Phosphatase: 56 U/L (ref 38–126)
TOTAL PROTEIN: 7.3 g/dL (ref 6.5–8.1)
Total Bilirubin: 0.2 mg/dL — ABNORMAL LOW (ref 0.3–1.2)

## 2017-04-23 LAB — LIPID PANEL
Cholesterol: 184 mg/dL (ref 0–200)
HDL: 26 mg/dL — AB (ref >40)
LDL CALC: 112 mg/dL — AB (ref 0–99)
Total CHOL/HDL Ratio: 7.1 RATIO
Triglycerides: 230 mg/dL — ABNORMAL HIGH (ref <150)
VLDL: 46 mg/dL — AB (ref 0–40)

## 2017-04-27 ENCOUNTER — Other Ambulatory Visit (HOSPITAL_COMMUNITY): Payer: PPO

## 2017-04-28 ENCOUNTER — Telehealth (HOSPITAL_COMMUNITY): Payer: Self-pay | Admitting: *Deleted

## 2017-04-28 NOTE — Telephone Encounter (Signed)
Notes recorded by Harvie Junior, CMA on 04/28/2017 at 3:22 PM EDT Pt aware of lab results and medication change. Lab appt scheduled 7/2 ------  Notes recorded by Harvie Junior, CMA on 04/27/2017 at 4:22 PM EDT Left VM for pt to call for lab results . ------  Notes recorded by Larey Dresser, MD on 04/26/2017 at 10:07 PM EDT LDL too high, increase atorvastatin to 40 mg daily with lipids/LFTs in 2 months.    Ref Range & Units 5d ago 18mo ago 48mo ago   Cholesterol 0 - 200 mg/dL 184  198  264     Triglycerides <150 mg/dL 230   <150 mg/dL" class="rz_v [id number]"> 206   <150 mg/dL" class="rz_w [id number]"> 448     HDL >40 mg/dL 26   29   24      Total CHOL/HDL Ratio RATIO 7.1  6.8  11.0    VLDL 0 - 40 mg/dL 46   41   UNABLE TO CALCULATE IF TRIGL...    LDL Cholesterol 0 - 99 mg/dL 112   128CM   UNABLE TO CALCULATE IF TRIGL.Marland KitchenMarland KitchenCM

## 2017-04-28 NOTE — Telephone Encounter (Signed)
-----   Message from Larey Dresser, MD sent at 04/26/2017 10:07 PM EDT ----- LDL too high, increase atorvastatin to 40 mg daily with lipids/LFTs in 2 months.

## 2017-05-18 ENCOUNTER — Other Ambulatory Visit: Payer: Self-pay | Admitting: Internal Medicine

## 2017-05-22 DIAGNOSIS — I429 Cardiomyopathy, unspecified: Secondary | ICD-10-CM | POA: Diagnosis not present

## 2017-05-22 DIAGNOSIS — E1165 Type 2 diabetes mellitus with hyperglycemia: Secondary | ICD-10-CM | POA: Diagnosis not present

## 2017-05-22 DIAGNOSIS — I739 Peripheral vascular disease, unspecified: Secondary | ICD-10-CM | POA: Diagnosis not present

## 2017-05-22 DIAGNOSIS — Z299 Encounter for prophylactic measures, unspecified: Secondary | ICD-10-CM | POA: Diagnosis not present

## 2017-05-22 DIAGNOSIS — J45909 Unspecified asthma, uncomplicated: Secondary | ICD-10-CM | POA: Diagnosis not present

## 2017-05-22 DIAGNOSIS — Z6834 Body mass index (BMI) 34.0-34.9, adult: Secondary | ICD-10-CM | POA: Diagnosis not present

## 2017-05-22 DIAGNOSIS — K219 Gastro-esophageal reflux disease without esophagitis: Secondary | ICD-10-CM | POA: Diagnosis not present

## 2017-05-22 DIAGNOSIS — I10 Essential (primary) hypertension: Secondary | ICD-10-CM | POA: Diagnosis not present

## 2017-05-22 DIAGNOSIS — F410 Panic disorder [episodic paroxysmal anxiety] without agoraphobia: Secondary | ICD-10-CM | POA: Diagnosis not present

## 2017-05-22 DIAGNOSIS — E781 Pure hyperglyceridemia: Secondary | ICD-10-CM | POA: Diagnosis not present

## 2017-05-26 DIAGNOSIS — E1140 Type 2 diabetes mellitus with diabetic neuropathy, unspecified: Secondary | ICD-10-CM | POA: Diagnosis not present

## 2017-05-26 DIAGNOSIS — I10 Essential (primary) hypertension: Secondary | ICD-10-CM | POA: Diagnosis not present

## 2017-05-26 DIAGNOSIS — Z6831 Body mass index (BMI) 31.0-31.9, adult: Secondary | ICD-10-CM | POA: Diagnosis not present

## 2017-05-26 DIAGNOSIS — E1165 Type 2 diabetes mellitus with hyperglycemia: Secondary | ICD-10-CM | POA: Diagnosis not present

## 2017-05-26 DIAGNOSIS — I739 Peripheral vascular disease, unspecified: Secondary | ICD-10-CM | POA: Diagnosis not present

## 2017-05-26 DIAGNOSIS — I429 Cardiomyopathy, unspecified: Secondary | ICD-10-CM | POA: Diagnosis not present

## 2017-05-26 DIAGNOSIS — Z299 Encounter for prophylactic measures, unspecified: Secondary | ICD-10-CM | POA: Diagnosis not present

## 2017-05-26 DIAGNOSIS — H401132 Primary open-angle glaucoma, bilateral, moderate stage: Secondary | ICD-10-CM | POA: Diagnosis not present

## 2017-05-26 DIAGNOSIS — Z713 Dietary counseling and surveillance: Secondary | ICD-10-CM | POA: Diagnosis not present

## 2017-06-19 ENCOUNTER — Other Ambulatory Visit: Payer: Self-pay | Admitting: Cardiology

## 2017-06-22 ENCOUNTER — Other Ambulatory Visit: Payer: Self-pay | Admitting: *Deleted

## 2017-06-24 ENCOUNTER — Encounter: Payer: Self-pay | Admitting: Endocrinology

## 2017-06-24 DIAGNOSIS — Z1389 Encounter for screening for other disorder: Secondary | ICD-10-CM | POA: Diagnosis not present

## 2017-06-24 DIAGNOSIS — E1165 Type 2 diabetes mellitus with hyperglycemia: Secondary | ICD-10-CM | POA: Diagnosis not present

## 2017-06-24 DIAGNOSIS — Z299 Encounter for prophylactic measures, unspecified: Secondary | ICD-10-CM | POA: Diagnosis not present

## 2017-06-24 DIAGNOSIS — Z79899 Other long term (current) drug therapy: Secondary | ICD-10-CM | POA: Diagnosis not present

## 2017-06-24 DIAGNOSIS — R5383 Other fatigue: Secondary | ICD-10-CM | POA: Diagnosis not present

## 2017-06-24 DIAGNOSIS — N62 Hypertrophy of breast: Secondary | ICD-10-CM | POA: Diagnosis not present

## 2017-06-24 DIAGNOSIS — Z0000 Encounter for general adult medical examination without abnormal findings: Secondary | ICD-10-CM | POA: Diagnosis not present

## 2017-06-24 DIAGNOSIS — Z6830 Body mass index (BMI) 30.0-30.9, adult: Secondary | ICD-10-CM | POA: Diagnosis not present

## 2017-06-24 DIAGNOSIS — E781 Pure hyperglyceridemia: Secondary | ICD-10-CM | POA: Diagnosis not present

## 2017-06-24 DIAGNOSIS — Z1211 Encounter for screening for malignant neoplasm of colon: Secondary | ICD-10-CM | POA: Diagnosis not present

## 2017-06-24 DIAGNOSIS — Z125 Encounter for screening for malignant neoplasm of prostate: Secondary | ICD-10-CM | POA: Diagnosis not present

## 2017-06-24 DIAGNOSIS — Z7189 Other specified counseling: Secondary | ICD-10-CM | POA: Diagnosis not present

## 2017-06-24 DIAGNOSIS — I10 Essential (primary) hypertension: Secondary | ICD-10-CM | POA: Diagnosis not present

## 2017-06-29 ENCOUNTER — Telehealth (HOSPITAL_COMMUNITY): Payer: Self-pay | Admitting: Pharmacist

## 2017-06-29 ENCOUNTER — Ambulatory Visit (HOSPITAL_COMMUNITY)
Admission: RE | Admit: 2017-06-29 | Discharge: 2017-06-29 | Disposition: A | Discharge status: Home / Self Care | Payer: PPO | Source: Ambulatory Visit | Attending: Cardiology | Admitting: Cardiology

## 2017-06-29 ENCOUNTER — Other Ambulatory Visit (HOSPITAL_COMMUNITY): Payer: Self-pay | Admitting: *Deleted

## 2017-06-29 DIAGNOSIS — I5022 Chronic systolic (congestive) heart failure: Secondary | ICD-10-CM

## 2017-06-29 LAB — LIPID PANEL
CHOL/HDL RATIO: 7 ratio
Cholesterol: 148 mg/dL (ref 0–200)
HDL: 21 mg/dL — AB (ref >40)
LDL CALC: 84 mg/dL (ref 0–99)
TRIGLYCERIDES: 215 mg/dL — AB (ref <150)
VLDL: 43 mg/dL — ABNORMAL HIGH (ref 0–40)

## 2017-06-29 LAB — HEPATIC FUNCTION PANEL
ALK PHOS: 60 U/L (ref 38–126)
ALT: 21 U/L (ref 17–63)
AST: 19 U/L (ref 15–41)
Albumin: 3.9 g/dL (ref 3.5–5.0)
Bilirubin, Direct: <0.1 mg/dL — ABNORMAL LOW (ref 0.1–0.5)
Total Bilirubin: 0.5 mg/dL (ref 0.3–1.2)
Total Protein: 7.7 g/dL (ref 6.5–8.1)

## 2017-06-29 NOTE — Telephone Encounter (Signed)
Mr. Colter ran out of PAN foundation grant so I have enrolled him in 2nd grant for another $800 to use toward his [name] and Entresto copays. Verified with [place] that they went through for $0.   Patient Name - Derek Mckenzie DOB - 1956/02/09 Member ID - [id number] Disease Fund - Heart Failure 2nd grant amount - $800  Total remaining balance - $800  Eligibility End Date - 02/05/2018  Claims Submission End Date - 06/05/2018   Ruta Hinds. Velva Harman, PharmD, BCPS, CPP Clinical Pharmacist Pager: [phone] Phone: [phone] 06/29/2017 12:37 PM

## 2017-06-30 ENCOUNTER — Telehealth (HOSPITAL_COMMUNITY): Payer: Self-pay | Admitting: *Deleted

## 2017-06-30 NOTE — Telephone Encounter (Signed)
-----   Message from Larey Dresser, MD sent at 06/30/2017  4:25 PM EDT ----- Lipids improved

## 2017-06-30 NOTE — Telephone Encounter (Signed)
Lipid Profile  Order: [id number]  Status:  Final result Visible to patient:  Yes (MyChart) Dx:  Chronic systolic heart failure ([place])  Notes recorded by Kennieth Rad, RN on 06/30/2017 at 4:31 PM EDT Called and spoke with patient's wife, she is aware and will let patient know. ------  Notes recorded by Larey Dresser, MD on 06/30/2017 at 4:25 PM EDT Lipids improved

## 2017-07-22 ENCOUNTER — Other Ambulatory Visit: Payer: Self-pay | Admitting: Cardiology

## 2017-07-29 ENCOUNTER — Encounter (HOSPITAL_COMMUNITY): Payer: Self-pay | Admitting: Cardiology

## 2017-07-29 ENCOUNTER — Ambulatory Visit (HOSPITAL_COMMUNITY)
Admission: RE | Admit: 2017-07-29 | Discharge: 2017-07-29 | Disposition: A | Discharge status: Home / Self Care | Payer: PPO | Source: Ambulatory Visit | Attending: Cardiology | Admitting: Cardiology

## 2017-07-29 ENCOUNTER — Other Ambulatory Visit (HOSPITAL_COMMUNITY): Payer: Self-pay | Admitting: Cardiology

## 2017-07-29 VITALS — BP 144/98 | HR 85 | Wt 187.8 lb

## 2017-07-29 DIAGNOSIS — I739 Peripheral vascular disease, unspecified: Secondary | ICD-10-CM

## 2017-07-29 DIAGNOSIS — Z79899 Other long term (current) drug therapy: Secondary | ICD-10-CM | POA: Insufficient documentation

## 2017-07-29 DIAGNOSIS — R531 Weakness: Secondary | ICD-10-CM | POA: Insufficient documentation

## 2017-07-29 DIAGNOSIS — Z87891 Personal history of nicotine dependence: Secondary | ICD-10-CM | POA: Insufficient documentation

## 2017-07-29 DIAGNOSIS — I10 Essential (primary) hypertension: Secondary | ICD-10-CM | POA: Diagnosis not present

## 2017-07-29 DIAGNOSIS — Z794 Long term (current) use of insulin: Secondary | ICD-10-CM | POA: Insufficient documentation

## 2017-07-29 DIAGNOSIS — Z7982 Long term (current) use of aspirin: Secondary | ICD-10-CM | POA: Insufficient documentation

## 2017-07-29 DIAGNOSIS — E1151 Type 2 diabetes mellitus with diabetic peripheral angiopathy without gangrene: Secondary | ICD-10-CM | POA: Insufficient documentation

## 2017-07-29 DIAGNOSIS — E785 Hyperlipidemia, unspecified: Secondary | ICD-10-CM | POA: Insufficient documentation

## 2017-07-29 DIAGNOSIS — I5022 Chronic systolic (congestive) heart failure: Secondary | ICD-10-CM

## 2017-07-29 DIAGNOSIS — E1122 Type 2 diabetes mellitus with diabetic chronic kidney disease: Secondary | ICD-10-CM | POA: Insufficient documentation

## 2017-07-29 DIAGNOSIS — I130 Hypertensive heart and chronic kidney disease with heart failure and stage 1 through stage 4 chronic kidney disease, or unspecified chronic kidney disease: Secondary | ICD-10-CM | POA: Insufficient documentation

## 2017-07-29 DIAGNOSIS — H9190 Unspecified hearing loss, unspecified ear: Secondary | ICD-10-CM | POA: Insufficient documentation

## 2017-07-29 DIAGNOSIS — N189 Chronic kidney disease, unspecified: Secondary | ICD-10-CM | POA: Diagnosis not present

## 2017-07-29 LAB — BASIC METABOLIC PANEL
Anion gap: 6 (ref 5–15)
BUN: 19 mg/dL (ref 6–20)
CO2: 26 mmol/L (ref 22–32)
Calcium: 9.4 mg/dL (ref 8.9–10.3)
Chloride: 105 mmol/L (ref 101–111)
Creatinine, Ser: 1.84 mg/dL — ABNORMAL HIGH (ref 0.61–1.24)
GFR calc Af Amer: 44 mL/min — ABNORMAL LOW (ref >60)
GFR, EST NON AFRICAN AMERICAN: 38 mL/min — AB (ref >60)
GLUCOSE: 253 mg/dL — AB (ref 65–99)
Potassium: 5.1 mmol/L (ref 3.5–5.1)
Sodium: 137 mmol/L (ref 135–145)

## 2017-07-29 MED ORDER — ATORVASTATIN CALCIUM 80 MG PO TABS: 80.0000 mg | ORAL_TABLET | Freq: Every day | ORAL | 3 refills | Status: DC

## 2017-07-29 MED ORDER — ISOSORB DINITRATE-HYDRALAZINE 20-37.5 MG PO TABS: 2.0000 | ORAL_TABLET | Freq: Three times a day (TID) | ORAL | 6 refills | Status: DC

## 2017-07-29 NOTE — Patient Instructions (Addendum)
INCREASE Atorvastatin to 80 mg once daily. May double up on your current 40 mg tablets you have at home (Take 2 tabs once daily). New Rx has been sent to your pharmacy for 80 mg tablets (take 1 tab once daily).  INCREASE [name] to 2 tabs three times daily. Covered under grant with [place].  Routine lab work today. Will notify you of abnormal results, otherwise no news is good news!  Return in 2 months for labs.  Follow up 4 months with Dr. Aundra Dubin. Take all medication as prescribed the day of your appointment. Bring all medications with you to your appointment.  Do the following things EVERYDAY: 1) Weigh yourself in the morning before breakfast. Write it down and keep it in a log. 2) Take your medicines as prescribed 3) Eat low salt foods-Limit salt (sodium) to 2000 mg per day.  4) Stay as active as you can everyday 5) Limit all fluids for the day to less than 2 liters

## 2017-07-30 NOTE — Progress Notes (Signed)
Patient ID: Derek Mckenzie, male   DOB: [birth year], 61 y.o.   MRN: [id number] PCP: Dr. Woody Seller HF Cardiology: Dr. Aundra Dubin  61 yo with history of HTN, DM, PAD, CKD and systolic CHF presents for CHF clinic followup.  He has had a long history of difficult-to-control HTN.  Medications were adjusted and repeat echo in 12/17 showed improvement in EF to 60-65%.  He has known PAD, significant on 12/17 peripheral arterial dopplers.  His legs feel weak with moderate exertion such as walking about 15 minutes.  No pain.  No pedal ulcers.  He saw Dr. Gwenlyn Found for evaluation and conservative treatment for now was recommended.   Very hard of hearing.  Seems to be doing well.  BP is high today but has not taken his morning meds.  Says that BP generally runs in 865H systolic.  Weight is down 4 lbs.  No exertional dyspnea.  No chest pain.   Labs (11/16): LDL 109, HDL 26, K 4.1, creatinine 1.42, BNP 197, TSH normal Labs (3/17): K 4.2, creatinine 1.73 Labs (12/17): K 5.4, creatinine 1.5, TGs 448, unable to calculate LDL, BNP 51, hgb 12.4 Labs (1/18): K 5.1, creatinine 1.62 Labs (7/18): LDL 84, HDL 21, TGs 215  ECG (personally reviewed, 1/18): NSR, inferolateral T wave inversions  PMH:  1. Type II diabetes. 2. HTN: x years, poorly controlled. 11/16 renal artery dopplers with no significant stenosis.  3. Deafness 4. PAD: 2012 peripheral arterial dopplers with right distal SFA stenosis and left mid SFA stenosis.  - Peripheral arterial dopplers (12/17): Probable right iliac obstruction and distal left SFA obstruction.  5. Sleep study negative for OSA 6. Chronic systolic CHF: Echo (84/69) with EF 30-35%, moderate LVH.  Possible hypertensive cardiomyopathy.  - Echo (12/17): EF 60-65%.  7. CKD 8. Hyperlipidemia  FH: No cardiac disease that he knows of.  +HTN.   Social History   Social History  . Marital status: Single    Spouse name: N/A  . Number of children: N/A  . Years of education: N/A   Occupational  History  . Not on file.   Social History Main Topics  . Smoking status: Former Smoker    Packs/day: 0.50    Years: 35.00    Types: Cigarettes  . Smokeless tobacco: Never Used     Comment: "quit smoking in ~ 2007"  . Alcohol use No  . Drug use: No  . Sexual activity: No   Other Topics Concern  . Not on file   Social History Narrative  . No narrative on file   ROS: All systems reviewed and negative except as per HPI.   Current Outpatient Prescriptions  Medication Sig Dispense Refill  . amLODipine (NORVASC) 10 MG tablet Take 1 tablet (10 mg total) by mouth daily. 30 tablet 0  . aspirin EC 81 MG tablet Take 1 tablet (81 mg total) by mouth daily. 30 tablet 3  . atorvastatin (LIPITOR) 80 MG tablet Take 1 tablet (80 mg total) by mouth daily. 90 tablet 3  . carvedilol (COREG) 25 MG tablet Take 25 mg by mouth 2 (two) times daily with a meal.    . folic acid (FOLVITE) 1 MG tablet Take 1 mg by mouth daily.    . insulin NPH-regular Human (NOVOLIN 70/30) (70-30) 100 UNIT/ML injection Inject 40 Units into the skin 2 (two) times daily with a meal. 10 mL 2  . isosorbide-hydrALAZINE ([name]) 20-37.5 MG tablet Take 2 tablets by mouth 3 (three) times daily. 180 tablet  6  . latanoprost (XALATAN) 0.005 % ophthalmic solution Place 1 drop into both eyes at bedtime.    Marland Kitchen spironolactone (ALDACTONE) 50 MG tablet Take 0.5 tablets (25 mg total) by mouth daily. 60 tablet 0  . vitamin B-12 (CYANOCOBALAMIN) 500 MCG tablet Take 500 mcg by mouth daily.    Marland Kitchen ENTRESTO 49-51 MG TAKE (1) TABLET TWICE DAILY. 60 tablet 6  . fenofibrate (TRICOR) 145 MG tablet Take 1 tablet (145 mg total) by mouth daily. 30 tablet 6   No current facility-administered medications for this encounter.    BP (!) 144/98 (BP Location: Left Arm, Patient Position: Sitting, Cuff Size: Normal)   Pulse 85   Wt 187 lb 12.8 oz (85.2 kg)   SpO2 93%   BMI 31.25 kg/m  General: NAD Neck: No JVD, no thyromegaly or thyroid nodule.  Lungs: Clear  to auscultation bilaterally with normal respiratory effort. CV: Nondisplaced PMI.  Heart regular S1/S2, +S4, no murmur.  No peripheral edema.  No carotid bruit.  Unable to palpate pedal pulses but feet are warm.  Abdomen: Soft, nontender, no hepatosplenomegaly, no distention.  Skin: Intact without lesions or rashes.  Neurologic: Alert and oriented x 3.  Psych: Normal affect. Extremities: No clubbing or cyanosis.  HEENT: Normal.   Assessment/Plan: 1. Chronic systolic CHF: Echo 94/49 with EF 30-35%.  Given improvement in EF to 60-65% in 12/17 with BP control, suspect this may have been a hypertensive cardiomyopathy.  NYHA class II, not volume overloaded on exam.  - Hold off on coronary angiography with normalization of EF, no chest pain.  - Continue spironolactone, Entresto, [name], and Coreg.  BMET today.  2. HTN: Renal arterial dopplers in 11/16 did not show evidence for renal artery stenosis.  BP improved but still runs mildly high.  - Increase [name] to 2 tabs tid.  3. PAD: Leg weakness but no claudication with ambulation, significant PAD on noninvasive evaluation in 12/17.   - Continue ASA 81 and statin.   - Conservative treatment for now per Dr. Gwenlyn Found.   4. Hyperlipidemia: Goal LDL < 70 with vascular disease.  I will increase atorvastatin to 80 mg daily with lipids/LFTs in 2 months.   I will see him back in 4 months.    Loralie Champagne 07/30/2017

## 2017-08-12 DIAGNOSIS — I429 Cardiomyopathy, unspecified: Secondary | ICD-10-CM | POA: Diagnosis not present

## 2017-08-12 DIAGNOSIS — E1165 Type 2 diabetes mellitus with hyperglycemia: Secondary | ICD-10-CM | POA: Diagnosis not present

## 2017-08-12 DIAGNOSIS — E7800 Pure hypercholesterolemia, unspecified: Secondary | ICD-10-CM | POA: Diagnosis not present

## 2017-08-12 DIAGNOSIS — J45909 Unspecified asthma, uncomplicated: Secondary | ICD-10-CM | POA: Diagnosis not present

## 2017-08-12 DIAGNOSIS — Z6830 Body mass index (BMI) 30.0-30.9, adult: Secondary | ICD-10-CM | POA: Diagnosis not present

## 2017-08-12 DIAGNOSIS — Z299 Encounter for prophylactic measures, unspecified: Secondary | ICD-10-CM | POA: Diagnosis not present

## 2017-08-12 DIAGNOSIS — I739 Peripheral vascular disease, unspecified: Secondary | ICD-10-CM | POA: Diagnosis not present

## 2017-08-12 DIAGNOSIS — L03116 Cellulitis of left lower limb: Secondary | ICD-10-CM | POA: Diagnosis not present

## 2017-08-12 DIAGNOSIS — I10 Essential (primary) hypertension: Secondary | ICD-10-CM | POA: Diagnosis not present

## 2017-08-25 ENCOUNTER — Other Ambulatory Visit (HOSPITAL_COMMUNITY): Payer: Self-pay | Admitting: Pharmacist

## 2017-08-25 MED ORDER — ATORVASTATIN CALCIUM 80 MG PO TABS: 80.0000 mg | ORAL_TABLET | Freq: Every day | ORAL | 3 refills | Status: DC

## 2017-08-26 DIAGNOSIS — I10 Essential (primary) hypertension: Secondary | ICD-10-CM | POA: Diagnosis not present

## 2017-08-26 DIAGNOSIS — I739 Peripheral vascular disease, unspecified: Secondary | ICD-10-CM | POA: Diagnosis not present

## 2017-08-26 DIAGNOSIS — Z713 Dietary counseling and surveillance: Secondary | ICD-10-CM | POA: Diagnosis not present

## 2017-08-26 DIAGNOSIS — I429 Cardiomyopathy, unspecified: Secondary | ICD-10-CM | POA: Diagnosis not present

## 2017-08-26 DIAGNOSIS — E7800 Pure hypercholesterolemia, unspecified: Secondary | ICD-10-CM | POA: Diagnosis not present

## 2017-08-26 DIAGNOSIS — Z299 Encounter for prophylactic measures, unspecified: Secondary | ICD-10-CM | POA: Diagnosis not present

## 2017-08-26 DIAGNOSIS — E1165 Type 2 diabetes mellitus with hyperglycemia: Secondary | ICD-10-CM | POA: Diagnosis not present

## 2017-08-26 DIAGNOSIS — Z6830 Body mass index (BMI) 30.0-30.9, adult: Secondary | ICD-10-CM | POA: Diagnosis not present

## 2017-08-27 ENCOUNTER — Encounter: Payer: Self-pay | Admitting: Endocrinology

## 2017-08-27 ENCOUNTER — Ambulatory Visit (INDEPENDENT_AMBULATORY_CARE_PROVIDER_SITE_OTHER): Payer: PPO | Admitting: Endocrinology

## 2017-08-27 DIAGNOSIS — Z125 Encounter for screening for malignant neoplasm of prostate: Secondary | ICD-10-CM

## 2017-08-27 DIAGNOSIS — N62 Hypertrophy of breast: Secondary | ICD-10-CM | POA: Insufficient documentation

## 2017-08-27 LAB — PSA: PSA: 0.7 ng/mL (ref 0.10–4.00)

## 2017-08-27 LAB — IBC PANEL
IRON: 47 ug/dL (ref 42–165)
Saturation Ratios: 15 % — ABNORMAL LOW (ref 20.0–50.0)
Transferrin: 224 mg/dL (ref 212.0–360.0)

## 2017-08-27 LAB — HCG, QUANTITATIVE, PREGNANCY: QUANTITATIVE HCG: 0.4 m[IU]/mL

## 2017-08-27 LAB — LUTEINIZING HORMONE: LH: 3.71 m[IU]/mL (ref 1.50–9.30)

## 2017-08-27 NOTE — Progress Notes (Signed)
Subjective:    Patient ID: Derek Mckenzie, male    DOB: Dec 12, 1956, 61 y.o.   MRN: [id number]  HPI Pt is referred by Dr Woody Seller, for gynecomastia.  Pt was the product of a normal pregnancy and delivery.  he had puberty at the normal age.  He has 3 biological children.  He denies any h/o infertility.  He has never had liver disease, cancer, cystic fibrosis, ulcerative colitis, alcoholism, or BPH. He has never taken cimetidine, growth hormone, risperidone, hCG, opioids, androgens, 5-alpha-reductase inhibitors, cancer chemotherapy, estrogens, or ketoconazole.  He now reports 6 months of moderate swelling at both breasts, and slight assoc pain.  He has been on aldactone x 5 years.  He was given topical rx for low testosterone a few years ago, but he took for just a brief time.   Past Medical History:  Diagnosis Date  . CHF (congestive heart failure) ([place])   . Deafness in left ear   . ED (erectile dysfunction)   . Hearing loss in right ear   . Hyperlipidemia   . Hypertension   . PAD (peripheral artery disease) ([place])   . Type II diabetes mellitus ([place])     Past Surgical History:  Procedure Laterality Date  . CATARACT EXTRACTION W/PHACO Left 03/11/2016   Procedure: CATARACT EXTRACTION PHACO AND INTRAOCULAR LENS PLACEMENT (IOC);  Surgeon: Rutherford Guys, MD;  Location: [facility];  Service: Ophthalmology;  Laterality: Left;  CDE:2.71  . CATARACT EXTRACTION W/PHACO Right 05/20/2016   Procedure: CATARACT EXTRACTION PHACO AND INTRAOCULAR LENS PLACEMENT (IOC);  Surgeon: Rutherford Guys, MD;  Location: [facility];  Service: Ophthalmology;  Laterality: Right;  CDE: 4.33  . RETINAL LASER PROCEDURE    . VASECTOMY    . YAG LASER APPLICATION Right 7/67/3419   Procedure: YAG LASER APPLICATION;  Surgeon: Rutherford Guys, MD;  Location: [facility];  Service: Ophthalmology;  Laterality: Right;    Social History   Social History  . Marital status: Single    Spouse name: N/A  . Number of children: N/A  . Years of education:  N/A   Occupational History  . Not on file.   Social History Main Topics  . Smoking status: Former Smoker    Packs/day: 0.50    Years: 35.00    Types: Cigarettes  . Smokeless tobacco: Never Used     Comment: "quit smoking in ~ 2007"  . Alcohol use No  . Drug use: No  . Sexual activity: No   Other Topics Concern  . Not on file   Social History Narrative  . No narrative on file    Current Outpatient Prescriptions on File Prior to Visit  Medication Sig Dispense Refill  . amLODipine (NORVASC) 10 MG tablet Take 1 tablet (10 mg total) by mouth daily. (Patient taking differently: Take 5 mg by mouth daily. ) 30 tablet 0  . aspirin EC 81 MG tablet Take 1 tablet (81 mg total) by mouth daily. 30 tablet 3  . atorvastatin (LIPITOR) 80 MG tablet Take 1 tablet (80 mg total) by mouth daily. 90 tablet 3  . carvedilol (COREG) 25 MG tablet Take 25 mg by mouth 2 (two) times daily with a meal.    . ENTRESTO 49-51 MG TAKE (1) TABLET TWICE DAILY. 60 tablet 6  . fenofibrate (TRICOR) 145 MG tablet Take 1 tablet (145 mg total) by mouth daily. 30 tablet 6  . folic acid (FOLVITE) 1 MG tablet Take 1 mg by mouth daily.    . insulin NPH-regular  Human (NOVOLIN 70/30) (70-30) 100 UNIT/ML injection Inject 40 Units into the skin 2 (two) times daily with a meal. 10 mL 2  . isosorbide-hydrALAZINE ([name]) 20-37.5 MG tablet Take 2 tablets by mouth 3 (three) times daily. 180 tablet 6  . latanoprost (XALATAN) 0.005 % ophthalmic solution Place 1 drop into both eyes at bedtime.    Marland Kitchen spironolactone (ALDACTONE) 50 MG tablet Take 0.5 tablets (25 mg total) by mouth daily. 60 tablet 0  . vitamin B-12 (CYANOCOBALAMIN) 500 MCG tablet Take 500 mcg by mouth daily.     No current facility-administered medications on file prior to visit.     Allergies  Allergen Reactions  . Atenolol Shortness Of Breath  . Amlodipine Besy-Benazepril Hcl Other (See Comments)    Headaches    Family History  Problem Relation Age of Onset  .  Hypertension Mother   . Stroke Mother   . Heart disease Father   . Other Neg Hx        gynecomastia    BP 136/78   Pulse 86   Wt 189 lb 3.2 oz (85.8 kg)   SpO2 96%   BMI 31.48 kg/m   Review of Systems denies depression, numbness, weight change, decreased urinary stream, muscle weakness, fever, headache, easy bruising, sob, rash, blurry vision, and chest pain.  He has ED sxs and rhinorrhea.    Objective:   Physical Exam VS: see vs page GEN: no distress HEAD: head: no deformity eyes: no periorbital swelling, no proptosis external nose and ears are normal mouth: no lesion seen NECK: supple, thyroid is not enlarged CHEST WALL: no deformity LUNGS: clear to auscultation BREASTS:  Moderate bilat gynecomastia CV: reg rate and rhythm, no murmur  ABD: abdomen is soft, nontender.  no hepatosplenomegaly.  not distended.  no hernia GENITALIA:  Normal male.   MUSCULOSKELETAL: muscle bulk and strength are grossly normal.  no obvious joint swelling.  gait is normal and steady EXTEMITIES: no deformity.  no ulcer on the feet, but the skin is dry.  feet are of normal color and temp.  1+ bilat leg edema.  There is bilateral onychomycosis of the toenails PULSES: dorsalis pedis intact bilat.  no carotid bruit NEURO:  cn 2-12 grossly intact, except for hearing loss.   readily moves all 4's.  sensation is intact to touch on the feet SKIN:  Normal texture and temperature.  No rash or suspicious lesion is visible.  Multiple healed abrasions.  Norma hair distribution.  NODES:  None palpable at the neck PSYCH: alert, well-oriented.  Does not appear anxious nor depressed.  outside test results are reviewed: TSH=2.5 Creat=1.9 Hepatic transaminases are normal Prolactin=9  I have reviewed outside records, and summarized: Pt was noted to have gynecomastia, and referred here.  Main problem addressed was DM.  Noncompliance with rx was noted     Assessment & Plan:  Gynecomastia, new, prob due to  aldactone   Patient Instructions  blood tests are requested for you today.  We'll let you know about the results. Changing the spironolactone to "eplerenone" might reduce the swelling Weight loss helps, also.  Testosterone treatment has risks, including increased or decreased fertility (depending on the type of treatment), hair loss, prostate cancer, benign prostate enlargement, blood clots, liver problems, lower hdl ("good cholesterol"), polycythemia (opposite of anemia), sleep apnea, and behavior changes.

## 2017-08-27 NOTE — Patient Instructions (Signed)
blood tests are requested for you today.  We'll let you know about the results. Changing the spironolactone to "eplerenone" might reduce the swelling Weight loss helps, also.  Testosterone treatment has risks, including increased or decreased fertility (depending on the type of treatment), hair loss, prostate cancer, benign prostate enlargement, blood clots, liver problems, lower hdl ("good cholesterol"), polycythemia (opposite of anemia), sleep apnea, and behavior changes.

## 2017-08-29 LAB — TESTOSTERONE,FREE AND TOTAL
TESTOSTERONE FREE: 7.2 pg/mL (ref 6.6–18.1)
Testosterone: 231 ng/dL — ABNORMAL LOW (ref 264–916)

## 2017-09-02 LAB — ESTRADIOL, FREE
ESTRADIOL: 23 pg/mL (ref <=29)
Estradiol, Free: 0.52 pg/mL — ABNORMAL HIGH (ref <=0.45)

## 2017-09-14 DIAGNOSIS — H401132 Primary open-angle glaucoma, bilateral, moderate stage: Secondary | ICD-10-CM | POA: Diagnosis not present

## 2017-09-25 ENCOUNTER — Telehealth (HOSPITAL_COMMUNITY): Payer: Self-pay | Admitting: Pharmacist

## 2017-09-25 NOTE — Telephone Encounter (Signed)
PANF grant ran out and it is out of funding so cannot renew at this time. Will provide samples if needed but also have patient fill out [employer] PAF application for assistance with Entresto copay costs.   Ruta Hinds. Velva Harman, PharmD, BCPS, CPP Clinical Pharmacist Pager: [phone] Phone: [phone] 09/25/2017 3:03 PM

## 2017-09-28 ENCOUNTER — Ambulatory Visit (HOSPITAL_COMMUNITY)
Admission: RE | Admit: 2017-09-28 | Discharge: 2017-09-28 | Disposition: A | Discharge status: Home / Self Care | Payer: PPO | Source: Ambulatory Visit | Attending: Cardiology | Admitting: Cardiology

## 2017-09-28 ENCOUNTER — Other Ambulatory Visit (HOSPITAL_COMMUNITY): Payer: Self-pay | Admitting: *Deleted

## 2017-09-28 DIAGNOSIS — I5022 Chronic systolic (congestive) heart failure: Secondary | ICD-10-CM | POA: Diagnosis not present

## 2017-09-28 LAB — HEPATIC FUNCTION PANEL
ALT: 17 U/L (ref 17–63)
AST: 15 U/L (ref 15–41)
Albumin: 3.8 g/dL (ref 3.5–5.0)
Alkaline Phosphatase: 63 U/L (ref 38–126)
BILIRUBIN DIRECT: 0.1 mg/dL (ref 0.1–0.5)
BILIRUBIN INDIRECT: 0.2 mg/dL — AB (ref 0.3–0.9)
BILIRUBIN TOTAL: 0.3 mg/dL (ref 0.3–1.2)
Total Protein: 7.1 g/dL (ref 6.5–8.1)

## 2017-09-28 LAB — LIPID PANEL
Cholesterol: 110 mg/dL (ref 0–200)
HDL: 22 mg/dL — ABNORMAL LOW (ref >40)
LDL CALC: 64 mg/dL (ref 0–99)
TRIGLYCERIDES: 118 mg/dL (ref <150)
Total CHOL/HDL Ratio: 5 RATIO
VLDL: 24 mg/dL (ref 0–40)

## 2017-09-28 MED ORDER — SACUBITRIL-VALSARTAN 49-51 MG PO TABS: 1.0000 | ORAL_TABLET | Freq: Two times a day (BID) | ORAL | 11 refills | Status: DC

## 2017-09-30 DIAGNOSIS — I872 Venous insufficiency (chronic) (peripheral): Secondary | ICD-10-CM | POA: Diagnosis not present

## 2017-09-30 DIAGNOSIS — L302 Cutaneous autosensitization: Secondary | ICD-10-CM | POA: Diagnosis not present

## 2017-10-26 ENCOUNTER — Telehealth (HOSPITAL_COMMUNITY): Payer: Self-pay | Admitting: Pharmacist

## 2017-10-26 NOTE — Telephone Encounter (Signed)
Hattie Perch (patient's caregiver) called to say that they received a call from [employer] patient assistance that they needed income verification to move forward with the application for assistance with his Entresto. Ms. Berneice Gandy wanted to know if this was worth it and I have advised her to go ahead and submit the information requested. In the meantime, I can provide him with more samples if needed.   Ruta Hinds. Velva Harman, PharmD, BCPS, CPP Clinical Pharmacist Pager: [phone] Phone: [phone] 10/26/2017 3:04 PM

## 2017-11-13 ENCOUNTER — Telehealth (HOSPITAL_COMMUNITY): Payer: Self-pay | Admitting: Pharmacist

## 2017-11-13 NOTE — Telephone Encounter (Signed)
[employer] patient assistance denied for [employer] 2/2 patient not having spent 10% of his annual income on medications for the year. Will attempt to provide samples as needed for th remainder of the year. No extenuating circumstances that would approve him on appeal. Info relayed to [facility] (patient's caregiver).   Ruta Hinds. Velva Harman, PharmD, BCPS, CPP Clinical Pharmacist Pager: [phone] Phone: [phone] 11/13/2017 12:21 PM

## 2017-11-30 ENCOUNTER — Other Ambulatory Visit: Payer: Self-pay

## 2017-11-30 ENCOUNTER — Ambulatory Visit (HOSPITAL_COMMUNITY)
Admission: RE | Admit: 2017-11-30 | Discharge: 2017-11-30 | Disposition: A | Discharge status: Home / Self Care | Payer: PPO | Source: Ambulatory Visit | Attending: Cardiology | Admitting: Cardiology

## 2017-11-30 ENCOUNTER — Other Ambulatory Visit: Payer: Self-pay | Admitting: Internal Medicine

## 2017-11-30 ENCOUNTER — Encounter (HOSPITAL_COMMUNITY): Payer: Self-pay | Admitting: Cardiology

## 2017-11-30 VITALS — BP 140/82 | HR 87 | Wt 192.4 lb

## 2017-11-30 DIAGNOSIS — Z87891 Personal history of nicotine dependence: Secondary | ICD-10-CM | POA: Diagnosis not present

## 2017-11-30 DIAGNOSIS — Z79899 Other long term (current) drug therapy: Secondary | ICD-10-CM | POA: Insufficient documentation

## 2017-11-30 DIAGNOSIS — Z794 Long term (current) use of insulin: Secondary | ICD-10-CM | POA: Insufficient documentation

## 2017-11-30 DIAGNOSIS — E1122 Type 2 diabetes mellitus with diabetic chronic kidney disease: Secondary | ICD-10-CM | POA: Insufficient documentation

## 2017-11-30 DIAGNOSIS — Z8249 Family history of ischemic heart disease and other diseases of the circulatory system: Secondary | ICD-10-CM | POA: Diagnosis not present

## 2017-11-30 DIAGNOSIS — N189 Chronic kidney disease, unspecified: Secondary | ICD-10-CM | POA: Diagnosis not present

## 2017-11-30 DIAGNOSIS — E785 Hyperlipidemia, unspecified: Secondary | ICD-10-CM | POA: Insufficient documentation

## 2017-11-30 DIAGNOSIS — I739 Peripheral vascular disease, unspecified: Secondary | ICD-10-CM

## 2017-11-30 DIAGNOSIS — I5022 Chronic systolic (congestive) heart failure: Secondary | ICD-10-CM | POA: Diagnosis not present

## 2017-11-30 DIAGNOSIS — Z7982 Long term (current) use of aspirin: Secondary | ICD-10-CM | POA: Diagnosis not present

## 2017-11-30 DIAGNOSIS — H9190 Unspecified hearing loss, unspecified ear: Secondary | ICD-10-CM | POA: Insufficient documentation

## 2017-11-30 DIAGNOSIS — I130 Hypertensive heart and chronic kidney disease with heart failure and stage 1 through stage 4 chronic kidney disease, or unspecified chronic kidney disease: Secondary | ICD-10-CM | POA: Diagnosis not present

## 2017-11-30 LAB — BASIC METABOLIC PANEL
ANION GAP: 7 (ref 5–15)
BUN: 25 mg/dL — ABNORMAL HIGH (ref 6–20)
CHLORIDE: 108 mmol/L (ref 101–111)
CO2: 23 mmol/L (ref 22–32)
CREATININE: 1.85 mg/dL — AB (ref 0.61–1.24)
Calcium: 9 mg/dL (ref 8.9–10.3)
GFR calc non Af Amer: 37 mL/min — ABNORMAL LOW (ref >60)
GFR, EST AFRICAN AMERICAN: 43 mL/min — AB (ref >60)
Glucose, Bld: 307 mg/dL — ABNORMAL HIGH (ref 65–99)
POTASSIUM: 4.8 mmol/L (ref 3.5–5.1)
SODIUM: 138 mmol/L (ref 135–145)

## 2017-11-30 MED ORDER — AMLODIPINE BESYLATE 10 MG PO TABS: 10.0000 mg | ORAL_TABLET | Freq: Every day | ORAL | 0 refills | Status: DC

## 2017-11-30 NOTE — Patient Instructions (Signed)
Take Amlodopine 10 mg (1 tab) daily  Labs drawn today (if we do not call you, then your lab work was stable)   Seen Derek Mckenzie today  Your physician has requested that you have an echocardiogram. Echocardiography is a painless test that uses sound waves to create images of your heart. It provides your doctor with information about the size and shape of your heart and how well your heart's chambers and valves are working. This procedure takes approximately one hour. There are no restrictions for this procedure.---In a week   Your physician has requested that you have a peripheral vascular angiogram. This exam is performed at the hospital. During this exam IV contrast is used to look at arterial blood flow. Please review the information sheet given for details.---In a week  Your physician recommends that you schedule a follow-up appointment in: 6 months with Dr. Aundra Dubin

## 2017-11-30 NOTE — Progress Notes (Signed)
Patient ID: Derek Mckenzie, male   DOB: [birth year], 61 y.o.   MRN: [id number] PCP: Dr. Woody Seller HF Cardiology: Dr. Aundra Dubin  61 y.o. with history of HTN, DM, PAD, CKD and systolic CHF presents for CHF clinic followup.  He has had a long history of difficult-to-control HTN.  Medications were adjusted and repeat echo in 12/17 showed improvement in EF to 60-65%.  He has known PAD, significant on 12/17 peripheral arterial dopplers.  He saw Dr. Gwenlyn Found for evaluation and conservative treatment for now was recommended.   Patient returns for followup of CHF and HTN. Very hard of hearing.  No significant exertional dyspnea.  No chest pain.  No claudication or pedal ulcerations.  Legs "get weak" when he walks a long distance but do not hurt.  BP elevated today.    Labs (11/16): LDL 109, HDL 26, K 4.1, creatinine 1.42, BNP 197, TSH normal Labs (3/17): K 4.2, creatinine 1.73 Labs (12/17): K 5.4, creatinine 1.5, TGs 448, unable to calculate LDL, BNP 51, hgb 12.4 Labs (1/18): K 5.1, creatinine 1.62 Labs (7/18): LDL 84, HDL 21, TGs 215 Labs (8/18): K 5.1, creatinine 1.84 Labs (10/18): LDL 64, HDL 22, LFTs normal  ECG (personally reviewed): NSR, lateral TWIs (similar to prior)  PMH:  1. Type II diabetes. 2. HTN: x years, poorly controlled. 11/16 renal artery dopplers with no significant stenosis.  3. Deafness 4. PAD: 2012 peripheral arterial dopplers with right distal SFA stenosis and left mid SFA stenosis.  - Peripheral arterial dopplers (12/17): Probable right iliac obstruction and distal left SFA obstruction.  5. Sleep study negative for OSA 6. Chronic systolic CHF: Echo (44/92) with EF 30-35%, moderate LVH.  Possible hypertensive cardiomyopathy.  - Echo (12/17): EF 60-65%.  7. CKD 8. Hyperlipidemia  FH: No cardiac disease that he knows of.  +HTN.   Social History   Socioeconomic History  . Marital status: Single    Spouse name: Not on file  . Number of children: Not on file  . Years of education:  Not on file  . Highest education level: Not on file  Social Needs  . Financial resource strain: Not on file  . Food insecurity - worry: Not on file  . Food insecurity - inability: Not on file  . Transportation needs - medical: Not on file  . Transportation needs - non-medical: Not on file  Occupational History  . Not on file  Tobacco Use  . Smoking status: Former Smoker    Packs/day: 0.50    Years: 35.00    Pack years: 17.50    Types: Cigarettes  . Smokeless tobacco: Never Used  . Tobacco comment: "quit smoking in ~ 2007"  Substance and Sexual Activity  . Alcohol use: No  . Drug use: No  . Sexual activity: No  Other Topics Concern  . Not on file  Social History Narrative  . Not on file   ROS: All systems reviewed and negative except as per HPI.   Current Outpatient Medications  Medication Sig Dispense Refill  . amLODipine (NORVASC) 10 MG tablet Take 1 tablet (10 mg total) by mouth daily. 30 tablet 0  . aspirin EC 81 MG tablet Take 1 tablet (81 mg total) by mouth daily. 30 tablet 3  . carvedilol (COREG) 25 MG tablet Take 25 mg by mouth 2 (two) times daily with a meal.    . fenofibrate (TRICOR) 145 MG tablet Take 1 tablet (145 mg total) by mouth daily. 30 tablet 6  . folic  acid (FOLVITE) 1 MG tablet Take 1 mg by mouth daily.    . insulin NPH-regular Human (NOVOLIN 70/30) (70-30) 100 UNIT/ML injection Inject 40 Units into the skin 2 (two) times daily with a meal. 10 mL 2  . isosorbide-hydrALAZINE ([name]) 20-37.5 MG tablet Take 2 tablets by mouth 3 (three) times daily. 180 tablet 6  . latanoprost (XALATAN) 0.005 % ophthalmic solution Place 1 drop into both eyes at bedtime.    . sacubitril-valsartan (ENTRESTO) 49-51 MG Take 1 tablet by mouth 2 (two) times daily. 60 tablet 11  . spironolactone (ALDACTONE) 50 MG tablet Take 0.5 tablets (25 mg total) by mouth daily. 60 tablet 0  . vitamin B-12 (CYANOCOBALAMIN) 500 MCG tablet Take 500 mcg by mouth daily.    Marland Kitchen atorvastatin (LIPITOR)  80 MG tablet Take 1 tablet (80 mg total) by mouth daily. 90 tablet 3   No current facility-administered medications for this encounter.    BP 140/82   Pulse 87   Wt 192 lb 6.4 oz (87.3 kg)   SpO2 97%   BMI 32.02 kg/m  General: NAD Neck: Thick, no JVD, no thyromegaly or thyroid nodule.  Lungs: Clear to auscultation bilaterally with normal respiratory effort. CV: Nondisplaced PMI.  Heart regular S1/S2, +S4, no murmur.  1+ ankle edema.  No carotid bruit.  Unable to palpate pedal pulses.  Abdomen: Soft, nontender, no hepatosplenomegaly, no distention.  Skin: Intact without lesions or rashes.  Neurologic: Alert and oriented x 3.  Psych: Normal affect. Extremities: No clubbing or cyanosis.  HEENT: Normal.   Assessment/Plan: 1. Chronic systolic CHF: Echo 35/00 with EF 30-35%.  Given improvement in EF to 60-65% in 12/17 with BP control, suspect this may have been a hypertensive cardiomyopathy.  NYHA class II, not volume overloaded on exam.  - Hold off on coronary angiography with normalization of EF, no chest pain.  - Continue spironolactone, Entresto, [name], and Coreg.  BMET today.  - Repeat echo to make sure that EF has remained normal.  2. HTN: Renal arterial dopplers in 11/16 did not show evidence for renal artery stenosis.  BP improved but still runs mildly high.  - Increase amlodipine to 10 mg daily.   3. PAD: Leg weakness but no claudication with ambulation, significant PAD on noninvasive evaluation in 12/17.   - Continue ASA 81 and statin.   - Conservative treatment for now per Dr. Gwenlyn Found.   - Will get repeat peripheral arterial dopplers to assess for any progression.  4. Hyperlipidemia: Lipids at goal (LDL < 70) when last checked.   I will see him back in 6 months.    Loralie Champagne 11/30/2017

## 2017-12-10 ENCOUNTER — Other Ambulatory Visit (HOSPITAL_COMMUNITY): Payer: Self-pay | Admitting: Cardiology

## 2017-12-10 DIAGNOSIS — I739 Peripheral vascular disease, unspecified: Secondary | ICD-10-CM

## 2017-12-15 DIAGNOSIS — H401132 Primary open-angle glaucoma, bilateral, moderate stage: Secondary | ICD-10-CM | POA: Diagnosis not present

## 2017-12-16 DIAGNOSIS — L300 Nummular dermatitis: Secondary | ICD-10-CM | POA: Diagnosis not present

## 2017-12-16 DIAGNOSIS — I872 Venous insufficiency (chronic) (peripheral): Secondary | ICD-10-CM | POA: Diagnosis not present

## 2017-12-25 ENCOUNTER — Other Ambulatory Visit: Payer: Self-pay

## 2017-12-25 ENCOUNTER — Ambulatory Visit (HOSPITAL_COMMUNITY)
Admission: RE | Admit: 2017-12-25 | Discharge: 2017-12-25 | Disposition: A | Discharge status: Home / Self Care | Payer: PPO | Source: Ambulatory Visit | Attending: Cardiovascular Disease | Admitting: Cardiovascular Disease

## 2017-12-25 ENCOUNTER — Ambulatory Visit (HOSPITAL_BASED_OUTPATIENT_CLINIC_OR_DEPARTMENT_OTHER): Payer: PPO

## 2017-12-25 ENCOUNTER — Other Ambulatory Visit (HOSPITAL_COMMUNITY): Payer: PPO

## 2017-12-25 DIAGNOSIS — I739 Peripheral vascular disease, unspecified: Secondary | ICD-10-CM | POA: Diagnosis not present

## 2017-12-25 DIAGNOSIS — I110 Hypertensive heart disease with heart failure: Secondary | ICD-10-CM | POA: Diagnosis not present

## 2017-12-25 DIAGNOSIS — Z8249 Family history of ischemic heart disease and other diseases of the circulatory system: Secondary | ICD-10-CM | POA: Diagnosis not present

## 2017-12-25 DIAGNOSIS — I5022 Chronic systolic (congestive) heart failure: Secondary | ICD-10-CM

## 2017-12-25 DIAGNOSIS — Z87891 Personal history of nicotine dependence: Secondary | ICD-10-CM | POA: Diagnosis not present

## 2017-12-25 DIAGNOSIS — E785 Hyperlipidemia, unspecified: Secondary | ICD-10-CM | POA: Insufficient documentation

## 2017-12-25 DIAGNOSIS — E1151 Type 2 diabetes mellitus with diabetic peripheral angiopathy without gangrene: Secondary | ICD-10-CM | POA: Diagnosis not present

## 2018-01-01 ENCOUNTER — Other Ambulatory Visit (HOSPITAL_COMMUNITY): Payer: Self-pay | Admitting: Cardiology

## 2018-01-19 ENCOUNTER — Telehealth (HOSPITAL_COMMUNITY): Payer: Self-pay | Admitting: Pharmacist

## 2018-01-19 NOTE — Telephone Encounter (Signed)
Renewed PANF grant for Derek Mckenzie so that he will have $1000 to use toward his [name] and [name] copay costs through 02/05/19. Relayed info to [place] (patient's caregiver).   Member ID: [id number] Group ID: [id number] RxBin ID: [id number] PCN: [id number] Eligibility Start Date: 02/06/2018 Eligibility End Date: 02/05/2019 Assistance Amount: $1,000.00   Doroteo Bradford K. Velva Harman, PharmD, BCPS, CPP Clinical Pharmacist Phone: [phone] 01/19/2018 4:19 PM

## 2018-02-23 ENCOUNTER — Other Ambulatory Visit (HOSPITAL_COMMUNITY): Payer: Self-pay | Admitting: Internal Medicine

## 2018-03-16 DIAGNOSIS — Z961 Presence of intraocular lens: Secondary | ICD-10-CM | POA: Diagnosis not present

## 2018-03-16 DIAGNOSIS — E119 Type 2 diabetes mellitus without complications: Secondary | ICD-10-CM | POA: Diagnosis not present

## 2018-03-16 DIAGNOSIS — H401132 Primary open-angle glaucoma, bilateral, moderate stage: Secondary | ICD-10-CM | POA: Diagnosis not present

## 2018-03-16 DIAGNOSIS — Z794 Long term (current) use of insulin: Secondary | ICD-10-CM | POA: Diagnosis not present

## 2018-05-03 ENCOUNTER — Encounter: Payer: Self-pay | Admitting: Orthopedic Surgery

## 2018-05-03 ENCOUNTER — Ambulatory Visit (INDEPENDENT_AMBULATORY_CARE_PROVIDER_SITE_OTHER): Payer: PPO

## 2018-05-03 ENCOUNTER — Ambulatory Visit: Payer: PPO | Admitting: Orthopedic Surgery

## 2018-05-03 VITALS — BP 147/82 | HR 92 | Ht 65.0 in | Wt 185.0 lb

## 2018-05-03 DIAGNOSIS — M25511 Pain in right shoulder: Secondary | ICD-10-CM

## 2018-05-03 DIAGNOSIS — M7551 Bursitis of right shoulder: Secondary | ICD-10-CM

## 2018-05-03 NOTE — Patient Instructions (Signed)
Bursitis Bursitis is inflammation and irritation of a bursa, which is one of the small, fluid-filled sacs that cushion and protect the moving parts of your body. These sacs are located between bones and muscles, muscle attachments, or skin areas next to bones. A bursa protects these structures from the wear and tear that results from frequent movement. An inflamed bursa causes pain and swelling. Fluid may build up inside the sac. Bursitis is most common near joints, especially the knees, elbows, hips, and shoulders. What are the causes? Bursitis can be caused by:  Injury from: ? A direct blow, like falling on your knee or elbow. ? Overuse of a joint (repetitive stress).  Infection. This can happen if bacteria gets into a bursa through a cut or scrape near a joint.  Diseases that cause joint inflammation, such as gout and rheumatoid arthritis.  What increases the risk? You may be at risk for bursitis if you:  Have a job or hobby that involves a lot of repetitive stress on your joints.  Have a condition that weakens your body's defense system (immune system), such as diabetes, cancer, or HIV.  Lift and reach overhead often.  Kneel or lean on hard surfaces often.  Run or walk often.  What are the signs or symptoms? The most common signs and symptoms of bursitis are:  Pain that gets worse when you move the affected body part or put weight on it.  Inflammation.  Stiffness.  Other signs and symptoms may include:  Redness.  Tenderness.  Warmth.  Pain that continues after rest.  Fever and chills. This may occur in bursitis caused by infection.  How is this diagnosed? Bursitis may be diagnosed by:  Medical history and physical exam.  MRI.  A procedure to drain fluid from the bursa with a needle (aspiration). The fluid may be checked for signs of infection or gout.  Blood tests to rule out other causes of inflammation.  How is this treated? Bursitis can usually be  treated at home with rest, ice, compression, and elevation (RICE). For mild bursitis, RICE treatment may be all you need. Other treatments may include:  Nonsteroidal anti-inflammatory drugs (NSAIDs) to treat pain and inflammation.  Corticosteroids to fight inflammation. You may have these drugs injected into and around the area of bursitis.  Aspiration of bursitis fluid to relieve pain and improve movement.  Antibiotic medicine to treat an infected bursa.  A splint, brace, or walking aid.  Physical therapy if you continue to have pain or limited movement.  Surgery to remove a damaged or infected bursa. This may be needed if you have a very bad case of bursitis or if other treatments have not worked.  Follow these instructions at home:  Take medicines only as directed by your health care provider.  If you were prescribed an antibiotic medicine, finish it all even if you start to feel better.  Rest the affected area as directed by your health care provider. ? Keep the area elevated. ? Avoid activities that make pain worse.  Apply ice to the injured area: ? Place ice in a plastic bag. ? Place a towel between your skin and the bag. ? Leave the ice on for 20 minutes, 2-3 times a day.  Use splints, braces, pads, or walking aids as directed by your health care provider.  Keep all follow-up visits as directed by your health care provider. This is important. How is this prevented?  Wear knee pads if you kneel often.    Wear sturdy running or walking shoes that fit you well.  Take regular breaks from repetitive activity.  Warm up by stretching before doing any strenuous activity.  Maintain a healthy weight or lose weight as recommended by your health care provider. Ask your health care provider if you need help.  Exercise regularly. Start any new physical activity gradually. Contact a health care provider if:  Your bursitis is not responding to treatment or home care.  You have  a fever.  You have chills. This information is not intended to replace advice given to you by your health care provider. Make sure you discuss any questions you have with your health care provider. Document Released: 12/12/2000 Document Revised: 05/22/2016 Document Reviewed: 03/06/2014 Elsevier Interactive Patient Education  2018 [employer]  

## 2018-05-03 NOTE — Progress Notes (Signed)
NEW PATIENT OFFICE VISIT   Chief Complaint  Patient presents with  . Shoulder Pain    right shoulder pain no injury about 1 and 1/2 months of pain      MEDICAL DECISION SECTION  X-rays of the right shoulder show no fracture or dislocation no arthritis  Encounter Diagnosis  Name Primary?  . Pain in joint of right shoulder Yes     PLAN: Recommend conservative treatment with injection in the subacromial space right shoulder  Procedure note the subacromial injection shoulder RIGHT  Verbal consent was obtained to inject the  RIGHT   Shoulder  Timeout was completed to confirm the injection site is a subacromial space of the  RIGHT  shoulder   Medication used Depo-Medrol 40 mg and lidocaine 1% 3 cc  Anesthesia was provided by ethyl chloride  The injection was performed in the RIGHT  posterior subacromial space. After pinning the skin with alcohol and anesthetized the skin with ethyl chloride the subacromial space was injected using a 20-gauge needle. There were no complications  Sterile dressing was applied.   Chief Complaint  Patient presents with  . Shoulder Pain    right shoulder pain no injury about 1 and 1/2 months of pain     62 year old male presents for evaluation of right shoulder pain  Complains of dull aching pain over the right shoulder for several weeks associated with painful range of motion with forward elevation above the head denies any history of trauma   Review of Systems  Respiratory: Negative.   Cardiovascular: Negative.   Neurological: Negative for tingling.     Past Medical History:  Diagnosis Date  . CHF (congestive heart failure) ([place])   . Deafness in left ear   . ED (erectile dysfunction)   . Hearing loss in right ear   . Hyperlipidemia   . Hypertension   . PAD (peripheral artery disease) ([place])   . Type II diabetes mellitus ([place])     Past Surgical History:  Procedure Laterality Date  . CATARACT EXTRACTION W/PHACO Left 03/11/2016    Procedure: CATARACT EXTRACTION PHACO AND INTRAOCULAR LENS PLACEMENT (IOC);  Surgeon: Rutherford Guys, MD;  Location: [facility];  Service: Ophthalmology;  Laterality: Left;  CDE:2.71  . CATARACT EXTRACTION W/PHACO Right 05/20/2016   Procedure: CATARACT EXTRACTION PHACO AND INTRAOCULAR LENS PLACEMENT (IOC);  Surgeon: Rutherford Guys, MD;  Location: [facility];  Service: Ophthalmology;  Laterality: Right;  CDE: 4.33  . RETINAL LASER PROCEDURE    . VASECTOMY    . YAG LASER APPLICATION Right 8/78/6767   Procedure: YAG LASER APPLICATION;  Surgeon: Rutherford Guys, MD;  Location: [facility];  Service: Ophthalmology;  Laterality: Right;    Family History  Problem Relation Age of Onset  . Hypertension Mother   . Stroke Mother   . Heart disease Father   . Other Neg Hx        gynecomastia   Social History   Tobacco Use  . Smoking status: Former Smoker    Packs/day: 0.50    Years: 35.00    Pack years: 17.50    Types: Cigarettes  . Smokeless tobacco: Never Used  . Tobacco comment: "quit smoking in ~ 2007"  Substance Use Topics  . Alcohol use: No  . Drug use: No    @ALL @  Current Meds  Medication Sig  . amLODipine (NORVASC) 10 MG tablet TAKE 1 TABLET BY MOUTH ONCE DAILY  . aspirin EC 81 MG tablet Take 1 tablet (81 mg total) by  mouth daily.  Marland Kitchen atorvastatin (LIPITOR) 80 MG tablet Take 1 tablet (80 mg total) by mouth daily.  . carvedilol (COREG) 25 MG tablet Take 25 mg by mouth 2 (two) times daily with a meal.  . fenofibrate (TRICOR) 145 MG tablet TAKE 1 TABLET ONCE DAILY.  . folic acid (FOLVITE) 1 MG tablet Take 1 mg by mouth daily.  . insulin NPH-regular Human (NOVOLIN 70/30) (70-30) 100 UNIT/ML injection Inject 40 Units into the skin 2 (two) times daily with a meal.  . isosorbide-hydrALAZINE ([name]) 20-37.5 MG tablet Take 2 tablets by mouth 3 (three) times daily.  Marland Kitchen latanoprost (XALATAN) 0.005 % ophthalmic solution Place 1 drop into both eyes at bedtime.  . sacubitril-valsartan (ENTRESTO) 49-51 MG Take  1 tablet by mouth 2 (two) times daily.  Marland Kitchen spironolactone (ALDACTONE) 50 MG tablet Take 0.5 tablets (25 mg total) by mouth daily.  . vitamin B-12 (CYANOCOBALAMIN) 500 MCG tablet Take 500 mcg by mouth daily.    BP (!) 147/82   Pulse 92   Ht 5\' 5"  (1.651 m)   Wt 185 lb (83.9 kg)   BMI 30.79 kg/m   Physical Exam  Constitutional: He is oriented to person, place, and time. He appears well-developed and well-nourished.  Vital signs have been reviewed and are stable. Gen. appearance the patient is well-developed and well-nourished with normal grooming and hygiene.   Neurological: He is alert and oriented to person, place, and time.  Skin: Skin is warm and dry. No erythema.  Psychiatric: He has a normal mood and affect.  Vitals reviewed.   Right Shoulder Exam   Tenderness  The patient is experiencing tenderness in the acromion.  Range of Motion  Active abduction: abnormal  Passive abduction: abnormal  Extension: abnormal  External rotation: abnormal  Forward flexion: abnormal  Internal rotation 0 degrees: abnormal   Muscle Strength  The patient has normal right shoulder strength. Abduction: 5/5  Supraspinatus: 5/5   Tests  Apprehension: negative Impingement: positive Sulcus: absent  Other  Erythema: absent Sensation: normal Pulse: present   Left Shoulder Exam  Left shoulder exam is normal.  Tenderness  The patient is experiencing no tenderness.   Range of Motion  The patient has normal left shoulder ROM.  Muscle Strength  The patient has normal left shoulder strength.  Tests  Apprehension: negative  Other  Erythema: absent Sensation: normal Pulse: present

## 2018-07-20 DIAGNOSIS — I429 Cardiomyopathy, unspecified: Secondary | ICD-10-CM | POA: Diagnosis not present

## 2018-07-20 DIAGNOSIS — I739 Peripheral vascular disease, unspecified: Secondary | ICD-10-CM | POA: Diagnosis not present

## 2018-07-20 DIAGNOSIS — E1165 Type 2 diabetes mellitus with hyperglycemia: Secondary | ICD-10-CM | POA: Diagnosis not present

## 2018-07-20 DIAGNOSIS — Z6830 Body mass index (BMI) 30.0-30.9, adult: Secondary | ICD-10-CM | POA: Diagnosis not present

## 2018-07-20 DIAGNOSIS — Z299 Encounter for prophylactic measures, unspecified: Secondary | ICD-10-CM | POA: Diagnosis not present

## 2018-07-20 DIAGNOSIS — E7800 Pure hypercholesterolemia, unspecified: Secondary | ICD-10-CM | POA: Diagnosis not present

## 2018-07-20 DIAGNOSIS — I10 Essential (primary) hypertension: Secondary | ICD-10-CM | POA: Diagnosis not present

## 2018-08-10 DIAGNOSIS — Z1211 Encounter for screening for malignant neoplasm of colon: Secondary | ICD-10-CM | POA: Diagnosis not present

## 2018-08-10 DIAGNOSIS — Z0000 Encounter for general adult medical examination without abnormal findings: Secondary | ICD-10-CM | POA: Diagnosis not present

## 2018-08-10 DIAGNOSIS — R5383 Other fatigue: Secondary | ICD-10-CM | POA: Diagnosis not present

## 2018-08-10 DIAGNOSIS — E7800 Pure hypercholesterolemia, unspecified: Secondary | ICD-10-CM | POA: Diagnosis not present

## 2018-08-10 DIAGNOSIS — Z7189 Other specified counseling: Secondary | ICD-10-CM | POA: Diagnosis not present

## 2018-08-10 DIAGNOSIS — I739 Peripheral vascular disease, unspecified: Secondary | ICD-10-CM | POA: Diagnosis not present

## 2018-08-10 DIAGNOSIS — Z1331 Encounter for screening for depression: Secondary | ICD-10-CM | POA: Diagnosis not present

## 2018-08-10 DIAGNOSIS — Z1339 Encounter for screening examination for other mental health and behavioral disorders: Secondary | ICD-10-CM | POA: Diagnosis not present

## 2018-08-10 DIAGNOSIS — Z6829 Body mass index (BMI) 29.0-29.9, adult: Secondary | ICD-10-CM | POA: Diagnosis not present

## 2018-08-10 DIAGNOSIS — Z299 Encounter for prophylactic measures, unspecified: Secondary | ICD-10-CM | POA: Diagnosis not present

## 2018-08-10 DIAGNOSIS — I10 Essential (primary) hypertension: Secondary | ICD-10-CM | POA: Diagnosis not present

## 2018-08-11 DIAGNOSIS — R5383 Other fatigue: Secondary | ICD-10-CM | POA: Diagnosis not present

## 2018-08-11 DIAGNOSIS — Z79899 Other long term (current) drug therapy: Secondary | ICD-10-CM | POA: Diagnosis not present

## 2018-08-11 DIAGNOSIS — E7800 Pure hypercholesterolemia, unspecified: Secondary | ICD-10-CM | POA: Diagnosis not present

## 2018-08-11 DIAGNOSIS — Z125 Encounter for screening for malignant neoplasm of prostate: Secondary | ICD-10-CM | POA: Diagnosis not present

## 2018-08-16 ENCOUNTER — Other Ambulatory Visit (HOSPITAL_COMMUNITY): Payer: Self-pay | Admitting: Cardiology

## 2018-09-06 ENCOUNTER — Other Ambulatory Visit (HOSPITAL_COMMUNITY): Payer: Self-pay | Admitting: Pharmacist

## 2018-09-06 MED ORDER — ISOSORB DINITRATE-HYDRALAZINE 20-37.5 MG PO TABS: 2.0000 | ORAL_TABLET | Freq: Three times a day (TID) | ORAL | 3 refills | Status: DC

## 2018-09-06 MED ORDER — SACUBITRIL-VALSARTAN 49-51 MG PO TABS: 1.0000 | ORAL_TABLET | Freq: Two times a day (BID) | ORAL | 11 refills | Status: DC

## 2018-09-16 DIAGNOSIS — H401132 Primary open-angle glaucoma, bilateral, moderate stage: Secondary | ICD-10-CM | POA: Diagnosis not present

## 2018-09-22 ENCOUNTER — Other Ambulatory Visit (HOSPITAL_COMMUNITY): Payer: Self-pay | Admitting: Cardiology

## 2018-09-22 ENCOUNTER — Other Ambulatory Visit (HOSPITAL_COMMUNITY): Payer: Self-pay | Admitting: Internal Medicine

## 2018-09-28 ENCOUNTER — Other Ambulatory Visit (HOSPITAL_COMMUNITY): Payer: Self-pay | Admitting: Internal Medicine

## 2018-10-01 ENCOUNTER — Telehealth (HOSPITAL_COMMUNITY): Payer: Self-pay | Admitting: Pharmacist

## 2018-10-01 NOTE — Telephone Encounter (Signed)
[facility] patient assistance denied since patient has not applied for and been denied Part D LIS. PAN foundation now open again so will renew this application when he runs out of his current grant for assistance with his HF medication copays.   Ruta Hinds. Velva Harman, PharmD, BCPS, CPP Clinical Pharmacist Phone: [phone] 10/01/2018 11:19 AM

## 2018-10-07 ENCOUNTER — Telehealth (HOSPITAL_COMMUNITY): Payer: Self-pay | Admitting: Pharmacist

## 2018-10-07 NOTE — Telephone Encounter (Signed)
-----   Message from Vanice Sarah, [place] sent at 10/07/2018  1:44 PM EDT ----- [employer] pt assistance called to state that pt needs a PA for entresto through his insurance company. [employer] would like you to call them once its approved at [phone]. Thanks.

## 2018-10-07 NOTE — Telephone Encounter (Signed)
Called [employer] and relayed to them that his [name] does not require a PA through his [employer] insurance.   Ruta Hinds. Velva Harman, PharmD, BCPS, CPP Clinical Pharmacist Phone: [phone] 10/07/2018 2:14 PM

## 2018-10-12 ENCOUNTER — Other Ambulatory Visit (HOSPITAL_COMMUNITY): Payer: Self-pay | Admitting: Pharmacist

## 2018-10-12 MED ORDER — SACUBITRIL-VALSARTAN 49-51 MG PO TABS: 1.0000 | ORAL_TABLET | Freq: Two times a day (BID) | ORAL | 11 refills | Status: DC

## 2018-11-11 ENCOUNTER — Telehealth (HOSPITAL_COMMUNITY): Payer: Self-pay | Admitting: Pharmacist

## 2018-11-11 NOTE — Telephone Encounter (Signed)
[employer] patient assistance approved for Entresto 49-51 mg BID through 12/28/18.   Ruta Hinds. Velva Harman, PharmD, BCPS, CPP Clinical Pharmacist Phone: [phone] 11/11/2018 4:06 PM

## 2018-11-22 ENCOUNTER — Other Ambulatory Visit (HOSPITAL_COMMUNITY): Payer: Self-pay | Admitting: Cardiology

## 2018-12-09 ENCOUNTER — Other Ambulatory Visit (HOSPITAL_COMMUNITY): Payer: Self-pay

## 2018-12-09 MED ORDER — SACUBITRIL-VALSARTAN 49-51 MG PO TABS: 1.0000 | ORAL_TABLET | Freq: Two times a day (BID) | ORAL | 4 refills | Status: DC

## 2018-12-10 ENCOUNTER — Telehealth (HOSPITAL_COMMUNITY): Payer: Self-pay | Admitting: Licensed Clinical Social Worker

## 2018-12-10 NOTE — Telephone Encounter (Signed)
CSW faxed in [employer] assistance application- fax confirmation received  Jorge Ny, LCSW Clinical Social Worker [facility] [phone]

## 2018-12-20 ENCOUNTER — Other Ambulatory Visit (HOSPITAL_COMMUNITY): Payer: Self-pay | Admitting: Cardiology

## 2019-01-04 ENCOUNTER — Encounter (HOSPITAL_COMMUNITY): Payer: Self-pay | Admitting: Cardiology

## 2019-01-04 ENCOUNTER — Telehealth (HOSPITAL_COMMUNITY): Payer: Self-pay

## 2019-01-04 ENCOUNTER — Ambulatory Visit (HOSPITAL_COMMUNITY)
Admission: RE | Admit: 2019-01-04 | Discharge: 2019-01-04 | Disposition: A | Discharge status: Home / Self Care | Payer: PPO | Source: Ambulatory Visit | Attending: Cardiology | Admitting: Cardiology

## 2019-01-04 VITALS — BP 142/72 | HR 88 | Wt 185.4 lb

## 2019-01-04 DIAGNOSIS — Z87891 Personal history of nicotine dependence: Secondary | ICD-10-CM | POA: Insufficient documentation

## 2019-01-04 DIAGNOSIS — E1122 Type 2 diabetes mellitus with diabetic chronic kidney disease: Secondary | ICD-10-CM | POA: Diagnosis not present

## 2019-01-04 DIAGNOSIS — N183 Chronic kidney disease, stage 3 (moderate): Secondary | ICD-10-CM | POA: Insufficient documentation

## 2019-01-04 DIAGNOSIS — I5022 Chronic systolic (congestive) heart failure: Secondary | ICD-10-CM | POA: Diagnosis not present

## 2019-01-04 DIAGNOSIS — Z794 Long term (current) use of insulin: Secondary | ICD-10-CM | POA: Diagnosis not present

## 2019-01-04 DIAGNOSIS — Z79899 Other long term (current) drug therapy: Secondary | ICD-10-CM | POA: Diagnosis not present

## 2019-01-04 DIAGNOSIS — I130 Hypertensive heart and chronic kidney disease with heart failure and stage 1 through stage 4 chronic kidney disease, or unspecified chronic kidney disease: Secondary | ICD-10-CM | POA: Insufficient documentation

## 2019-01-04 DIAGNOSIS — H9190 Unspecified hearing loss, unspecified ear: Secondary | ICD-10-CM | POA: Insufficient documentation

## 2019-01-04 DIAGNOSIS — E785 Hyperlipidemia, unspecified: Secondary | ICD-10-CM | POA: Insufficient documentation

## 2019-01-04 DIAGNOSIS — Z7982 Long term (current) use of aspirin: Secondary | ICD-10-CM | POA: Diagnosis not present

## 2019-01-04 DIAGNOSIS — E1151 Type 2 diabetes mellitus with diabetic peripheral angiopathy without gangrene: Secondary | ICD-10-CM | POA: Diagnosis not present

## 2019-01-04 DIAGNOSIS — I739 Peripheral vascular disease, unspecified: Secondary | ICD-10-CM | POA: Diagnosis not present

## 2019-01-04 DIAGNOSIS — I5020 Unspecified systolic (congestive) heart failure: Secondary | ICD-10-CM | POA: Diagnosis present

## 2019-01-04 LAB — LIPID PANEL
Cholesterol: 138 mg/dL (ref 0–200)
HDL: 22 mg/dL — AB (ref >40)
LDL Cholesterol: 76 mg/dL (ref 0–99)
Total CHOL/HDL Ratio: 6.3 RATIO
Triglycerides: 199 mg/dL — ABNORMAL HIGH (ref <150)
VLDL: 40 mg/dL (ref 0–40)

## 2019-01-04 LAB — BASIC METABOLIC PANEL
ANION GAP: 6 (ref 5–15)
BUN: 27 mg/dL — AB (ref 8–23)
CHLORIDE: 105 mmol/L (ref 98–111)
CO2: 24 mmol/L (ref 22–32)
Calcium: 9.1 mg/dL (ref 8.9–10.3)
Creatinine, Ser: 2.21 mg/dL — ABNORMAL HIGH (ref 0.61–1.24)
GFR calc Af Amer: 35 mL/min — ABNORMAL LOW (ref >60)
GFR calc non Af Amer: 31 mL/min — ABNORMAL LOW (ref >60)
GLUCOSE: 364 mg/dL — AB (ref 70–99)
POTASSIUM: 5.8 mmol/L — AB (ref 3.5–5.1)
Sodium: 135 mmol/L (ref 135–145)

## 2019-01-04 MED ORDER — SODIUM ZIRCONIUM CYCLOSILICATE 10 G PO PACK: 10.0000 g | PACK | Freq: Once | ORAL | 0 refills | Status: AC

## 2019-01-04 NOTE — Progress Notes (Signed)
Patient ID: Derek Mckenzie, male   DOB: [birth year], 63 y.o.   MRN: [id number] PCP: Dr. Woody Seller HF Cardiology: Dr. Aundra Dubin  63 y.o. with history of HTN, DM, PAD, CKD and systolic CHF presents for CHF clinic followup.  He has had a long history of difficult-to-control HTN.  Medications were adjusted and repeat echo in 12/17 showed improvement in EF to 60-65%.  He has known PAD, significant on 12/17 peripheral arterial dopplers.  He saw Dr. Gwenlyn Found for evaluation and conservative treatment for now was recommended.  Echo in 12/18 showed EF 60-65%, moderate LVH.    Patient returns for followup of CHF and HTN. Very hard of hearing.  BP under much better control.  Mildly elevated today in the office but has been generally < 163 systolic at home.  No significant exertional dyspnea. No chest pain.  No orthopnea/PND.  No claudication.  Weight down 7 lbs.   Labs (11/16): LDL 109, HDL 26, K 4.1, creatinine 1.42, BNP 197, TSH normal Labs (3/17): K 4.2, creatinine 1.73 Labs (12/17): K 5.4, creatinine 1.5, TGs 448, unable to calculate LDL, BNP 51, hgb 12.4 Labs (1/18): K 5.1, creatinine 1.62 Labs (7/18): LDL 84, HDL 21, TGs 215 Labs (8/18): K 5.1, creatinine 1.84 Labs (10/18): LDL 64, HDL 22, LFTs normal Labs (12/18): K 4.8, creatinine 1.85  PMH:  1. Type II diabetes. 2. HTN: x years, poorly controlled. 11/16 renal artery dopplers with no significant stenosis.  3. Deafness 4. PAD: 2012 peripheral arterial dopplers with right distal SFA stenosis and left mid SFA stenosis.  - Peripheral arterial dopplers (12/17): Probable right iliac obstruction and distal left SFA obstruction.  - ABIs (12/18): 0.61 right, 0.9 left (improved).  5. Sleep study negative for OSA 6. Chronic systolic CHF: Echo (84/66) with EF 30-35%, moderate LVH.  Possible hypertensive cardiomyopathy.  - Echo (12/17): EF 60-65%.  - Echo (12/18): EF 60-65%, moderate LVH.  7. CKD: Stage 3.  8. Hyperlipidemia  FH: No cardiac disease that he knows  of.  +HTN.   Social History   Socioeconomic History  . Marital status: Single    Spouse name: Not on file  . Number of children: Not on file  . Years of education: Not on file  . Highest education level: Not on file  Occupational History  . Not on file  Social Needs  . Financial resource strain: Not on file  . Food insecurity:    Worry: Not on file    Inability: Not on file  . Transportation needs:    Medical: Not on file    Non-medical: Not on file  Tobacco Use  . Smoking status: Former Smoker    Packs/day: 0.50    Years: 35.00    Pack years: 17.50    Types: Cigarettes  . Smokeless tobacco: Never Used  . Tobacco comment: "quit smoking in ~ 2007"  Substance and Sexual Activity  . Alcohol use: No  . Drug use: No  . Sexual activity: Never  Lifestyle  . Physical activity:    Days per week: Not on file    Minutes per session: Not on file  . Stress: Not on file  Relationships  . Social connections:    Talks on phone: Not on file    Gets together: Not on file    Attends religious service: Not on file    Active member of club or organization: Not on file    Attends meetings of clubs or organizations: Not on file  Relationship status: Not on file  . Intimate partner violence:    Fear of current or ex partner: Not on file    Emotionally abused: Not on file    Physically abused: Not on file    Forced sexual activity: Not on file  Other Topics Concern  . Not on file  Social History Narrative  . Not on file   ROS: All systems reviewed and negative except as per HPI.   Current Outpatient Medications  Medication Sig Dispense Refill  . amLODipine (NORVASC) 10 MG tablet TAKE 1 TABLET BY MOUTH ONCE DAILY (Patient taking differently: Take 5 mg by mouth daily. ) 30 tablet 3  . aspirin EC 81 MG tablet Take 1 tablet (81 mg total) by mouth daily. 30 tablet 3  . atorvastatin (LIPITOR) 80 MG tablet TAKE (1) TABLET BY MOUTH ONCE DAILY. 30 tablet 3  . carvedilol (COREG) 25 MG  tablet Take 25 mg by mouth 2 (two) times daily with a meal.    . fenofibrate (TRICOR) 145 MG tablet TAKE 1 TABLET ONCE DAILY. 30 tablet 0  . folic acid (FOLVITE) 1 MG tablet Take 1 mg by mouth daily.    . insulin NPH-regular Human (NOVOLIN 70/30) (70-30) 100 UNIT/ML injection Inject 40 Units into the skin 2 (two) times daily with a meal. 10 mL 2  . isosorbide-hydrALAZINE ([name]) 20-37.5 MG tablet Take 2 tablets by mouth 3 (three) times daily. 540 tablet 3  . latanoprost (XALATAN) 0.005 % ophthalmic solution Place 1 drop into both eyes at bedtime.    . sacubitril-valsartan (ENTRESTO) 49-51 MG Take 1 tablet by mouth 2 (two) times daily. 180 tablet 4  . spironolactone (ALDACTONE) 50 MG tablet Take 0.5 tablets (25 mg total) by mouth daily. 60 tablet 0  . vitamin B-12 (CYANOCOBALAMIN) 500 MCG tablet Take 500 mcg by mouth daily.    . sodium zirconium cyclosilicate (LOKELMA) 10 g PACK packet Take 10 g by mouth once for 1 dose. 1 packet 0   No current facility-administered medications for this encounter.    BP (!) 142/72   Pulse 88   Wt 84.1 kg (185 lb 6.4 oz)   SpO2 97%   BMI 30.85 kg/m  General: NAD Neck: No JVD, no thyromegaly or thyroid nodule.  Lungs: Clear to auscultation bilaterally with normal respiratory effort. CV: Nondisplaced PMI.  Heart regular S1/S2, +S4, no murmur.  No peripheral edema.  No carotid bruit.  Difficult to palpate pedal pulses.  Abdomen: Soft, nontender, no hepatosplenomegaly, no distention.  Skin: Intact without lesions or rashes.  Neurologic: Alert and oriented x 3.  Psych: Normal affect. Extremities: No clubbing or cyanosis.  HEENT: Normal.   Assessment/Plan: 1. Chronic systolic CHF: Echo 24/23 with EF 30-35%.  Given improvement in EF to 60-65% in 12/17 and 12/18 with BP control, suspect this was a hypertensive cardiomyopathy.  NYHA class II, not volume overloaded on exam.  - Continue spironolactone, Entresto, [name], and Coreg.  BMET today and should have BMET  every 3 months given spironolactone use. Will try to arrange at [facility] office in [place].   2. HTN: Renal arterial dopplers in 11/16 did not show evidence for renal artery stenosis.  BP has been much improved recently.   - No med changes today.    3. PAD: No claudication.  ABIs abnormal but improved in 12/18.    - Continue ASA 81 and statin.   - Conservative treatment for now per Dr. Gwenlyn Found.   - Repeat ABIs  to assess for any progression.  4. Hyperlipidemia: Check lipids today.  5. CKD: Stage 3.  BMET today.   I will see him back in 1 year.     Loralie Champagne 01/04/2019

## 2019-01-04 NOTE — Patient Instructions (Addendum)
Labs today We will only contact you if something comes back abnormal or we need to make some changes. Otherwise no news is good news!  Please repeat blood work every 3 months. Office will contact you with lab results. This can be done in [place]. Please schedule your appointments.    Your physician has requested that you have a lower or upper extremity arterial duplex. This test is an ultrasound of the arteries in the legs or arms. It looks at arterial blood flow in the legs and arms. Allow one hour for Lower and Upper Arterial scans. There are no restrictions or special instructions  Your physician recommends that you schedule a follow-up appointment in: 1 year. Please call in November to schedule your appointment.

## 2019-01-04 NOTE — Telephone Encounter (Signed)
Spoke with patients friend that was at visit with him today. Results of blood work given, recommendations from physician to take 1 dose of Lokelma 10mg  and repeat blood work on Thursday. Rx sent to pharmacy on file and bmet ordered for Thursday. States understanding of all.

## 2019-01-04 NOTE — Telephone Encounter (Signed)
error 

## 2019-01-04 NOTE — Telephone Encounter (Signed)
-----   Message from Larey Dresser, MD sent at 01/04/2019  2:05 PM EST ----- Stop spironolactone.  Needs 1 dose of Lokelma 10 mg. Repeat BMET on Thursday.  Also should have referral to nephrology for CKD.

## 2019-01-04 NOTE — Progress Notes (Signed)
Pt received 2 bottles of samples for [name]  Lot [id number] B exp 12/2020 Lot [id number] B exp  07/2019

## 2019-01-05 ENCOUNTER — Telehealth (HOSPITAL_COMMUNITY): Payer: Self-pay

## 2019-01-05 NOTE — Telephone Encounter (Signed)
Referral and patient records faxed to [place] [facility]

## 2019-01-07 ENCOUNTER — Telehealth (HOSPITAL_COMMUNITY): Payer: Self-pay

## 2019-01-07 NOTE — Telephone Encounter (Signed)
-----   Message from Larey Dresser, MD sent at 01/04/2019  2:05 PM EST ----- Stop spironolactone.  Needs 1 dose of Lokelma 10 mg. Repeat BMET on Thursday.  Also should have referral to nephrology for CKD.

## 2019-01-07 NOTE — Telephone Encounter (Signed)
Called [name] (patients friend) as patient did not have labs drawn yesterday as instructed on 01/04/19.  K elevated and was instructed to take 1 dose of Lokelma and redraw labs on 1/08/30/19.  [name] states patient was unable to obtain medicine from pharmacy as it was not in stock. She states she attempted to reach our office without success. She states patient did not have labs drawn because she did not get medicine.  She was instructed about the importance of checking labs to know what is potassium level is currently as it could be high and dangerous to his heart. She states she could not get an appointment at [facility] as they did not answer the phone. Renato Gails that I could locate a [facility]. She declined and states she would continue to try.   Called Linda moments later and left VM to advise patient can walk into lab to have it drawn

## 2019-01-10 ENCOUNTER — Other Ambulatory Visit: Payer: Self-pay | Admitting: Cardiology

## 2019-01-10 DIAGNOSIS — I5022 Chronic systolic (congestive) heart failure: Secondary | ICD-10-CM | POA: Diagnosis not present

## 2019-01-11 LAB — BASIC METABOLIC PANEL
BUN/Creatinine Ratio: 12 (ref 10–24)
BUN: 23 mg/dL (ref 8–27)
CALCIUM: 9.4 mg/dL (ref 8.6–10.2)
CO2: 22 mmol/L (ref 20–29)
CREATININE: 1.96 mg/dL — AB (ref 0.76–1.27)
Chloride: 102 mmol/L (ref 96–106)
GFR calc non Af Amer: 35 mL/min/{1.73_m2} — ABNORMAL LOW (ref >59)
GFR, EST AFRICAN AMERICAN: 41 mL/min/{1.73_m2} — AB (ref >59)
Glucose: 246 mg/dL — ABNORMAL HIGH (ref 65–99)
Potassium: 5.3 mmol/L — ABNORMAL HIGH (ref 3.5–5.2)
Sodium: 138 mmol/L (ref 134–144)

## 2019-01-12 ENCOUNTER — Telehealth (HOSPITAL_COMMUNITY): Payer: Self-pay | Admitting: Licensed Clinical Social Worker

## 2019-01-12 NOTE — Telephone Encounter (Signed)
CSW called and spoke with pt friend about his PAN foundation funding ending and need to find alternative help with paying for Entresto.   Pt could be eligible for [facility] but would have to use remaining balance on PAN before he is eligible- CSW informed pt friend of this- they will plan to pick up medication and then call CSW to apply for [facility].  CSW will continue to follow and assist as needed.  Jorge Ny, LCSW Clinical Social Worker [facility] [phone]

## 2019-01-13 ENCOUNTER — Telehealth (HOSPITAL_COMMUNITY): Payer: Self-pay | Admitting: Cardiology

## 2019-01-13 ENCOUNTER — Other Ambulatory Visit (HOSPITAL_COMMUNITY): Payer: Self-pay | Admitting: Cardiology

## 2019-01-13 DIAGNOSIS — I5022 Chronic systolic (congestive) heart failure: Secondary | ICD-10-CM

## 2019-01-13 NOTE — Telephone Encounter (Signed)
Abnormal labs drawn 01/10/19 K 5.3 Cr 1.96 BUN 23 Na 138   Per Amy Clegg, Np Stop spiro repeat bmet in 7-10 days   Pt aware via [facility] Repeat labs  01/20/2019

## 2019-01-14 ENCOUNTER — Telehealth (HOSPITAL_COMMUNITY): Payer: Self-pay | Admitting: Licensed Clinical Social Worker

## 2019-01-14 NOTE — Telephone Encounter (Signed)
CSW applied for grant through [employer] for [employer] copay assistance- $2,500 grant approved and information provided to pt pharmacy.  Pt updated- no further needs at this time  CSW will continue to follow  Jorge Ny, [place] Worker [facility] [phone]

## 2019-01-17 ENCOUNTER — Other Ambulatory Visit (HOSPITAL_COMMUNITY): Payer: Self-pay | Admitting: Cardiology

## 2019-01-17 ENCOUNTER — Encounter (HOSPITAL_COMMUNITY): Payer: PPO

## 2019-01-20 ENCOUNTER — Ambulatory Visit (HOSPITAL_COMMUNITY)
Admission: RE | Admit: 2019-01-20 | Discharge: 2019-01-20 | Disposition: A | Discharge status: Home / Self Care | Payer: PPO | Source: Ambulatory Visit | Attending: Cardiology | Admitting: Cardiology

## 2019-01-20 DIAGNOSIS — I5022 Chronic systolic (congestive) heart failure: Secondary | ICD-10-CM | POA: Insufficient documentation

## 2019-01-20 LAB — BASIC METABOLIC PANEL
ANION GAP: 6 (ref 5–15)
BUN: 20 mg/dL (ref 8–23)
CHLORIDE: 106 mmol/L (ref 98–111)
CO2: 26 mmol/L (ref 22–32)
Calcium: 8.9 mg/dL (ref 8.9–10.3)
Creatinine, Ser: 1.82 mg/dL — ABNORMAL HIGH (ref 0.61–1.24)
GFR calc Af Amer: 45 mL/min — ABNORMAL LOW (ref >60)
GFR calc non Af Amer: 39 mL/min — ABNORMAL LOW (ref >60)
GLUCOSE: 253 mg/dL — AB (ref 70–99)
POTASSIUM: 5.1 mmol/L (ref 3.5–5.1)
SODIUM: 138 mmol/L (ref 135–145)

## 2019-01-24 ENCOUNTER — Other Ambulatory Visit (HOSPITAL_COMMUNITY): Payer: Self-pay | Admitting: Cardiology

## 2019-01-26 ENCOUNTER — Telehealth (HOSPITAL_COMMUNITY): Payer: Self-pay

## 2019-01-26 ENCOUNTER — Ambulatory Visit (INDEPENDENT_AMBULATORY_CARE_PROVIDER_SITE_OTHER): Payer: PPO

## 2019-01-26 DIAGNOSIS — I739 Peripheral vascular disease, unspecified: Secondary | ICD-10-CM

## 2019-01-26 NOTE — Telephone Encounter (Signed)
Aware of results of ABI study. No changes to plan of care. Appreciative.

## 2019-01-26 NOTE — Telephone Encounter (Signed)
-----   Message from Larey Dresser, MD sent at 01/26/2019  1:42 PM EST ----- Abnormal but no significant changes.

## 2019-02-21 ENCOUNTER — Other Ambulatory Visit (HOSPITAL_COMMUNITY): Payer: Self-pay | Admitting: Cardiology

## 2019-04-25 DIAGNOSIS — E1165 Type 2 diabetes mellitus with hyperglycemia: Secondary | ICD-10-CM | POA: Diagnosis not present

## 2019-04-25 DIAGNOSIS — Z299 Encounter for prophylactic measures, unspecified: Secondary | ICD-10-CM | POA: Diagnosis not present

## 2019-04-25 DIAGNOSIS — Z6829 Body mass index (BMI) 29.0-29.9, adult: Secondary | ICD-10-CM | POA: Diagnosis not present

## 2019-04-25 DIAGNOSIS — I739 Peripheral vascular disease, unspecified: Secondary | ICD-10-CM | POA: Diagnosis not present

## 2019-04-25 DIAGNOSIS — I429 Cardiomyopathy, unspecified: Secondary | ICD-10-CM | POA: Diagnosis not present

## 2019-04-25 DIAGNOSIS — I10 Essential (primary) hypertension: Secondary | ICD-10-CM | POA: Diagnosis not present

## 2019-04-27 ENCOUNTER — Other Ambulatory Visit (HOSPITAL_COMMUNITY): Payer: Self-pay | Admitting: Cardiology

## 2019-05-06 DIAGNOSIS — H16291 Other keratoconjunctivitis, right eye: Secondary | ICD-10-CM | POA: Diagnosis not present

## 2019-05-16 ENCOUNTER — Ambulatory Visit (INDEPENDENT_AMBULATORY_CARE_PROVIDER_SITE_OTHER): Payer: PPO | Admitting: Internal Medicine

## 2019-05-16 ENCOUNTER — Encounter: Payer: Self-pay | Admitting: Internal Medicine

## 2019-05-16 ENCOUNTER — Other Ambulatory Visit: Payer: Self-pay

## 2019-05-16 ENCOUNTER — Ambulatory Visit: Payer: PPO | Admitting: Internal Medicine

## 2019-05-16 VITALS — BP 150/70 | Temp 98.4°F | Ht 64.5 in | Wt 182.3 lb

## 2019-05-16 DIAGNOSIS — E1122 Type 2 diabetes mellitus with diabetic chronic kidney disease: Secondary | ICD-10-CM

## 2019-05-16 DIAGNOSIS — E118 Type 2 diabetes mellitus with unspecified complications: Secondary | ICD-10-CM

## 2019-05-16 DIAGNOSIS — E1165 Type 2 diabetes mellitus with hyperglycemia: Secondary | ICD-10-CM | POA: Diagnosis not present

## 2019-05-16 DIAGNOSIS — Z794 Long term (current) use of insulin: Secondary | ICD-10-CM

## 2019-05-16 DIAGNOSIS — N183 Chronic kidney disease, stage 3 unspecified: Secondary | ICD-10-CM

## 2019-05-16 LAB — POCT GLYCOSYLATED HEMOGLOBIN (HGB A1C): Hemoglobin A1C: 12.2 % — AB (ref 4.0–5.6)

## 2019-05-16 MED ORDER — INSULIN NPH ISOPHANE & REGULAR (70-30) 100 UNIT/ML ~~LOC~~ SUSP: 20.0000 [IU] | Freq: Two times a day (BID) | SUBCUTANEOUS | 6 refills | Status: DC

## 2019-05-16 NOTE — Patient Instructions (Addendum)
-   STOP Novolin-N - Start Novolin (70/30) 20 Units with Breakfast and 20 units with Supper  - Check sugar before breakfast and before supper - Bring meter on next visit    - HOW TO TREAT LOW BLOOD SUGARS (Blood sugar LESS THAN 70 MG/DL)  Please follow the RULE OF 15 for the treatment of hypoglycemia treatment (when your (blood sugars are less than 70 mg/dL)    STEP 1: Take 15 grams of carbohydrates when your blood sugar is low, which includes:   3-4 GLUCOSE TABS  OR  3-4 OZ OF JUICE OR REGULAR SODA OR  ONE TUBE OF GLUCOSE GEL     STEP 2: RECHECK blood sugar in 15 MINUTES STEP 3: If your blood sugar is still low at the 15 minute recheck --> then, go back to STEP 1 and treat AGAIN with another 15 grams of carbohydrates.

## 2019-05-16 NOTE — Progress Notes (Signed)
Name: Derek Mckenzie  MRN/ DOB: [id number], October 12, 1956   Age/ Sex: 63 y.o., male    PCP: Glenda Chroman, MD   Reason for Endocrinology Evaluation: Type 2 Diabetes Mellitus     Date of Initial Endocrinology Visit: 05/16/2019     PATIENT IDENTIFIER: Derek Mckenzie is a 63 y.o. male with a past medical history of HTN, T2DM, Cardiomyopathy and dyslipidemia. The patient presented for initial endocrinology clinic visit on 05/16/2019 for consultative assistance with his diabetes management.    HPI: [name] is accompanied by his friend /room mate [name]    Diagnosed with T2DM > 20 yrs Prior Medications tried/Intolerance: Does not recall Currently checking blood sugars sporadically  Hypoglycemia episodes : no         S Hemoglobin A1c has ranged from 12.2% in 2016, peaking at 13.1% in  2019 Patient required assistance for hypoglycemia: no Patient has required hospitalization within the last 1 year from hyper or hypoglycemia: no  In terms of diet, the patient avoids sugar-sweetened beverages, eats 2-3 meals, snacks 1-2 a day    HOME DIABETES REGIMEN: Novolin- N 35 units BID   Statin: Yes ACE-I/ARB: yes Prior Diabetic Education: Yes   METER DOWNLOAD SUMMARY: Did not bring   DIABETIC COMPLICATIONS: Microvascular complications:   CKD III  Denies: neuropathy, retinopathy  Last eye exam: Completed 03/2019  Macrovascular complications:   CHF, PVD  Denies: CAD, CVA   PAST HISTORY: Past Medical History:  Past Medical History:  Diagnosis Date  . CHF (congestive heart failure) ([place])   . Deafness in left ear   . ED (erectile dysfunction)   . Hearing loss in right ear   . Hyperlipidemia   . Hypertension   . PAD (peripheral artery disease) ([place])   . Type II diabetes mellitus ([place])    Past Surgical History:  Past Surgical History:  Procedure Laterality Date  . CATARACT EXTRACTION W/PHACO Left 03/11/2016   Procedure: CATARACT EXTRACTION PHACO AND INTRAOCULAR  LENS PLACEMENT (IOC);  Surgeon: Rutherford Guys, MD;  Location: [facility];  Service: Ophthalmology;  Laterality: Left;  CDE:2.71  . CATARACT EXTRACTION W/PHACO Right 05/20/2016   Procedure: CATARACT EXTRACTION PHACO AND INTRAOCULAR LENS PLACEMENT (IOC);  Surgeon: Rutherford Guys, MD;  Location: [facility];  Service: Ophthalmology;  Laterality: Right;  CDE: 4.33  . RETINAL LASER PROCEDURE    . VASECTOMY    . YAG LASER APPLICATION Right 06/23/9484   Procedure: YAG LASER APPLICATION;  Surgeon: Rutherford Guys, MD;  Location: [facility];  Service: Ophthalmology;  Laterality: Right;      Social History:  reports that he has quit smoking. His smoking use included cigarettes. He has a 17.50 pack-year smoking history. He has never used smokeless tobacco. He reports that he does not drink alcohol or use drugs. Family History:  Family History  Problem Relation Age of Onset  . Hypertension Mother   . Stroke Mother   . Heart disease Father   . Other Neg Hx        gynecomastia     HOME MEDICATIONS: Allergies as of 05/16/2019      Reactions   Atenolol Shortness Of Breath   Amlodipine Besy-benazepril Hcl Other (See Comments)   Headaches      Medication List       Accurate as of May 16, 2019  9:25 AM. If you have any questions, ask your nurse or doctor.        amLODipine 10 MG tablet Commonly known as:  NORVASC TAKE 1 TABLET BY MOUTH ONCE DAILY What changed:  how much to take   aspirin EC 81 MG tablet Take 1 tablet (81 mg total) by mouth daily.   atorvastatin 80 MG tablet Commonly known as:  LIPITOR TAKE 1 TABLET ONCE DAILY.   [name] 20-37.5 MG tablet Generic drug:  isosorbide-hydrALAZINE TAKE (2) TABLETS BY MOUTH THREE TIMES DAILY.   carvedilol 25 MG tablet Commonly known as:  COREG Take 25 mg by mouth 2 (two) times daily with a meal.   fenofibrate 145 MG tablet Commonly known as:  TRICOR TAKE 1 TABLET ONCE DAILY.   folic acid 1 MG tablet Commonly known as:  FOLVITE Take 400 mcg by mouth  daily.   insulin NPH-regular Human (70-30) 100 UNIT/ML injection Inject 40 Units into the skin 2 (two) times daily with a meal.   latanoprost 0.005 % ophthalmic solution Commonly known as:  XALATAN Place 1 drop into both eyes at bedtime.   Lokelma 10 g Pack packet Generic drug:  sodium zirconium cyclosilicate   sacubitril-valsartan 49-51 MG Commonly known as:  Entresto Take 1 tablet by mouth 2 (two) times daily.   tobramycin-dexamethasone ophthalmic solution Commonly known as:  TOBRADEX Instill one drop OD QID x 5 days   vitamin B-12 500 MCG tablet Commonly known as:  CYANOCOBALAMIN Take 500 mcg by mouth daily.        ALLERGIES: Allergies  Allergen Reactions  . Atenolol Shortness Of Breath  . Amlodipine Besy-Benazepril Hcl Other (See Comments)    Headaches     REVIEW OF SYSTEMS: A comprehensive ROS was conducted with the patient and is negative except as per HPI and below:  Review of Systems  Constitutional: Negative for fever.  HENT: Positive for hearing loss. Negative for congestion and sore throat.   Eyes: Negative for blurred vision and pain.  Respiratory: Negative for cough and shortness of breath.   Cardiovascular: Negative for chest pain.  Gastrointestinal: Negative for diarrhea and nausea.  Genitourinary: Negative for frequency.  Neurological: Negative for tingling and tremors.  Endo/Heme/Allergies: Negative for polydipsia.  Psychiatric/Behavioral: Negative for depression. The patient is not nervous/anxious.       OBJECTIVE:   VITAL SIGNS: BP (!) 150/70   Temp 98.4 F (36.9 C) (Oral)   Ht 5' 4.5" (1.638 m)   Wt 182 lb 4.8 oz (82.7 kg)   BMI 30.81 kg/m    PHYSICAL EXAM:  General: Pt appears well and is in NAD  Hydration: Well-hydrated with moist mucous membranes and good skin turgor  HEENT: Head: Unremarkable with good dentition. Oropharynx clear without exudate.  Eyes: External eye exam normal without stare, lid lag or exophthalmos.  EOM  intact.  Neck: General: Supple without adenopathy or carotid bruits. Thyroid: Thyroid size normal.  No goiter or nodules appreciated. No thyroid bruit.  Lungs: Clear with good BS bilat with no rales, rhonchi, or wheezes  Heart: RRR with normal S1 and S2 and no gallops; no murmurs; no rub  Abdomen: Normoactive bowel sounds, soft, nontender, without masses or organomegaly palpable  Extremities:  Lower extremities - No pretibial edema. No lesions.  Skin: Normal texture and temperature to palpation. No rash noted. No Acanthosis nigricans/skin tags.   Neuro: MS is good with appropriate affect, pt is alert and Ox3    DM foot exam: 05/16/2019 The skin of the feet is intact without sores or ulcerations.Bilateral planter callous formation noted and thickened, discolored toe nails The pedal pulses are 1+ on right and 1+  on left. The sensation is intact to a screening 5.07, 10 gram monofilament bilaterally   DATA REVIEWED:  Lab Results  Component Value Date   HGBA1C 12.2 (A) 05/16/2019   HGBA1C 12.2 (H) 10/31/2015   Lab Results  Component Value Date   LDLCALC 76 01/04/2019   CREATININE 1.82 (H) 01/20/2019    Lab Results  Component Value Date   CHOL 138 01/04/2019   HDL 22 (L) 01/04/2019   LDLCALC 76 01/04/2019   TRIG 199 (H) 01/04/2019   CHOLHDL 6.3 01/04/2019        07/20/2018  A1c 13.1 %  GFR 38 mL/min/1.73   ASSESSMENT / PLAN / RECOMMENDATIONS:   1) Type 2 Diabetes Mellitus, Poorly controlled, With CKD III complications and PVD - Most recent A1c of 13.1 %. Goal A1c < 7.0 %.   Plan: GENERAL: I have discussed with the patient the pathophysiology of diabetes. We went over the natural progression of the disease. We talked about both insulin resistance and insulin deficiency. We stressed the importance of lifestyle changes including diet and exercise. I explained the complications associated with diabetes including retinopathy, nephropathy, neuropathy as well as increased risk of  cardiovascular disease. We went over the benefit seen with glycemic control.   I explained to the patient that diabetic patients are at higher than normal risk for amputations.  Pt is very hard of hearing which make it difficult to communicate today without the help of his friend [name].   Poorly controlled diabetes is due to sub-optimal diabetes management which is due to monetary issues.   Pt can not afford any of the previous insulin regimens he had in the past, his cardiology meds are expensive and he prioratizes this.  We discussed the difference between long and short acting insulin, I explained to him that his diabetes is out of control due to lack of short acting insulin , novolin-N is long acting and will not cover post-prandial glucose spikes.   To simplify his regimen, will proceed with Novolion- MIx  We also discussed the importance of checking glucose at least 2 x a day and the importance of the availability of this data to me.     MEDICATIONS:  Stop Novolin-N   Start Novolin Mix (70/30) 20 units BID with Breakfast and supper   EDUCATION / INSTRUCTIONS:  BG monitoring instructions: Patient is instructed to check his blood sugars 2 times a day, fasting and supper time.  Call [facility] if: BG persistently < 70 or > 300. . I reviewed the Rule of 15 for the treatment of hypoglycemia in detail with the patient. Literature supplied.   2) Diabetic complications:   Eye: Does not have known diabetic retinopathy.   Neuro/ Feet: Does not have known diabetic peripheral neuropathy.  Renal: Patient does have known baseline CKD. He is on an ACEI/ARB at present.Check urine albumin/creatinine ratio yearly starting at time of diagnosis. If albuminuria is positive, treatment is geared toward better glucose, blood pressure control and use of ACE inhibitors or ARBs. Monitor electrolytes and creatinine once to twice yearly.   3) Lipids: Patient is  on lipitor 80 mg  daily .   4) Hypertension: He is not  at goal of < 140/90 mmHg.    F/u in 6 weeks    Signed electronically by: Mack Guise, MD  [facility] End[facility]  [facility] [address] [place], [place] [address] Phone: [phone] FAX: [phone]   CC: Jerene Bears  Jacinto Reap, MD [place] [address] Phone: [phone]  Fax: [phone]    Return to Endocrinology clinic as below: No future appointments.

## 2019-06-02 DIAGNOSIS — E119 Type 2 diabetes mellitus without complications: Secondary | ICD-10-CM | POA: Diagnosis not present

## 2019-06-02 DIAGNOSIS — Z961 Presence of intraocular lens: Secondary | ICD-10-CM | POA: Diagnosis not present

## 2019-06-02 DIAGNOSIS — H524 Presbyopia: Secondary | ICD-10-CM | POA: Diagnosis not present

## 2019-06-02 DIAGNOSIS — H401132 Primary open-angle glaucoma, bilateral, moderate stage: Secondary | ICD-10-CM | POA: Diagnosis not present

## 2019-06-02 DIAGNOSIS — Z794 Long term (current) use of insulin: Secondary | ICD-10-CM | POA: Diagnosis not present

## 2019-06-24 ENCOUNTER — Other Ambulatory Visit: Payer: Self-pay

## 2019-06-27 ENCOUNTER — Ambulatory Visit: Payer: PPO | Admitting: Internal Medicine

## 2019-06-27 ENCOUNTER — Other Ambulatory Visit: Payer: Self-pay

## 2019-06-27 ENCOUNTER — Encounter: Payer: Self-pay | Admitting: Internal Medicine

## 2019-06-27 VITALS — BP 132/78 | HR 94 | Temp 98.4°F | Ht 65.0 in | Wt 184.8 lb

## 2019-06-27 DIAGNOSIS — E1165 Type 2 diabetes mellitus with hyperglycemia: Secondary | ICD-10-CM | POA: Diagnosis not present

## 2019-06-27 DIAGNOSIS — E1122 Type 2 diabetes mellitus with diabetic chronic kidney disease: Secondary | ICD-10-CM

## 2019-06-27 DIAGNOSIS — E118 Type 2 diabetes mellitus with unspecified complications: Secondary | ICD-10-CM | POA: Diagnosis not present

## 2019-06-27 DIAGNOSIS — N183 Chronic kidney disease, stage 3 (moderate): Secondary | ICD-10-CM | POA: Diagnosis not present

## 2019-06-27 DIAGNOSIS — Z794 Long term (current) use of insulin: Secondary | ICD-10-CM | POA: Diagnosis not present

## 2019-06-27 MED ORDER — NOVOLIN 70/30 (70-30) 100 UNIT/ML ~~LOC~~ SUSP: 16.0000 [IU] | Freq: Two times a day (BID) | SUBCUTANEOUS | 6 refills | Status: DC

## 2019-06-27 NOTE — Progress Notes (Signed)
Name: Derek Mckenzie  Age/ Sex: 63 y.o., male   MRN/ DOB: [id number], 01/05/1956     PCP: Glenda Chroman, MD   Reason for Endocrinology Evaluation: Type 2 Diabetes Mellitus  Initial Endocrine Consultative Visit: 05/16/2019    PATIENT IDENTIFIER: Derek Mckenzie is a 63 y.o. male with a past medical history of HTN, T2DM, cardiomyopathy and dyslipidemia. The patient has followed with Endocrinology clinic since 05/16/2019 for consultative assistance with management of his diabetes.  DIABETIC HISTORY:  Derek Mckenzie was diagnosed with T2DM > 20 yrs ago. He does not recall prior oral glycemic agents, he has been on insulin for years.  His hemoglobin A1c has ranged from 12.2% in 2016, peaking at 13.1% in  2019   SUBJECTIVE:   During the last visit (05/16/2019): A1c   Today (06/27/2019): Derek Mckenzie is here for a 6 week follow up on diabetes management, he is accompanied by his roommate [name].  He checks his blood sugars 2 times daily, preprandial to breakfast and supper. The patient has  had hypoglycemic episodes since the last clinic visit, which typically occur 3 x / week- most often occuring at supper time. The patient is  symptomatic with these episodes, with symptoms of shaking. Otherwise, the patient has not required any recent emergency interventions for hypoglycemia and has not had recent hospitalizations secondary to hyper or hypoglycemic episodes.    ROS: As per HPI and as detailed below: Review of Systems  Constitutional: Negative for fever and weight loss.  HENT: Negative for congestion and sore throat.   Respiratory: Negative for cough and shortness of breath.   Cardiovascular: Negative for chest pain and palpitations.  Gastrointestinal: Negative for diarrhea and nausea.      HOME DIABETES REGIMEN:  Novolin Mix (70/30) 20 units BID with Breakfast and supper     METER DOWNLOAD SUMMARY: unable to download    Date Fasting Supper  06/27/19 105   6/28 113 56   6/27 117 83  6/26 100 67  6/25  56      HISTORY:  Past Medical History:  Past Medical History:  Diagnosis Date  . CHF (congestive heart failure) ([place])   . Deafness in left ear   . ED (erectile dysfunction)   . Hearing loss in right ear   . Hyperlipidemia   . Hypertension   . PAD (peripheral artery disease) ([place])   . Type II diabetes mellitus ([place])    Past Surgical History:  Past Surgical History:  Procedure Laterality Date  . CATARACT EXTRACTION W/PHACO Left 03/11/2016   Procedure: CATARACT EXTRACTION PHACO AND INTRAOCULAR LENS PLACEMENT (IOC);  Surgeon: Rutherford Guys, MD;  Location: [facility];  Service: Ophthalmology;  Laterality: Left;  CDE:2.71  . CATARACT EXTRACTION W/PHACO Right 05/20/2016   Procedure: CATARACT EXTRACTION PHACO AND INTRAOCULAR LENS PLACEMENT (IOC);  Surgeon: Rutherford Guys, MD;  Location: [facility];  Service: Ophthalmology;  Laterality: Right;  CDE: 4.33  . RETINAL LASER PROCEDURE    . VASECTOMY    . YAG LASER APPLICATION Right 0/09/9322   Procedure: YAG LASER APPLICATION;  Surgeon: Rutherford Guys, MD;  Location: [facility];  Service: Ophthalmology;  Laterality: Right;    Social History:  reports that he has quit smoking. His smoking use included cigarettes. He has a 17.50 pack-year smoking history. He has never used smokeless tobacco. He reports that he does not drink alcohol or use drugs. Family History:  Family History  Problem Relation Age of Onset  . Hypertension Mother   .  Stroke Mother   . Heart disease Father   . Other Neg Hx        gynecomastia     HOME MEDICATIONS: Allergies as of 06/27/2019      Reactions   Atenolol Shortness Of Breath   Amlodipine Besy-benazepril Hcl Other (See Comments)   Headaches      Medication List       Accurate as of June 27, 2019  9:57 AM. If you have any questions, ask your nurse or doctor.        amLODipine 10 MG tablet Commonly known as: NORVASC TAKE 1 TABLET BY MOUTH ONCE DAILY What changed: how much to take    aspirin EC 81 MG tablet Take 1 tablet (81 mg total) by mouth daily.   atorvastatin 80 MG tablet Commonly known as: LIPITOR TAKE 1 TABLET ONCE DAILY.   [name] 20-37.5 MG tablet Generic drug: isosorbide-hydrALAZINE TAKE (2) TABLETS BY MOUTH THREE TIMES DAILY.   carvedilol 25 MG tablet Commonly known as: COREG Take 25 mg by mouth 2 (two) times daily with a meal.   fenofibrate 145 MG tablet Commonly known as: TRICOR TAKE 1 TABLET ONCE DAILY.   folic acid 1 MG tablet Commonly known as: FOLVITE Take 400 mcg by mouth daily.   latanoprost 0.005 % ophthalmic solution Commonly known as: XALATAN Place 1 drop into both eyes at bedtime.   Lokelma 10 g Pack packet Generic drug: sodium zirconium cyclosilicate   NovoLIN 18/84 (70-30) 100 UNIT/ML injection Generic drug: insulin NPH-regular Human Inject 16 Units into the skin 2 (two) times daily with a meal. What changed: how much to take Changed by: [name], MD   sacubitril-valsartan 49-51 MG Commonly known as: Entresto Take 1 tablet by mouth 2 (two) times daily.   tobramycin-dexamethasone ophthalmic solution Commonly known as: TOBRADEX Instill one drop OD QID x 5 days   vitamin B-12 500 MCG tablet Commonly known as: CYANOCOBALAMIN Take 500 mcg by mouth daily.        OBJECTIVE:   Vital Signs: BP 132/78 (BP Location: Left Arm, Patient Position: Sitting, Cuff Size: Normal)   Pulse 94   Temp 98.4 F (36.9 C)   Ht 5\' 5"  (1.651 m)   Wt 184 lb 12.8 oz (83.8 kg)   SpO2 96%   BMI 30.75 kg/m   Wt Readings from Last 3 Encounters:  06/27/19 184 lb 12.8 oz (83.8 kg)  05/16/19 182 lb 4.8 oz (82.7 kg)  01/04/19 185 lb 6.4 oz (84.1 kg)     Exam: General: Pt appears well and is in NAD  Lungs: Clear with good BS bilat with no rales, rhonchi, or wheezes  Heart: RRR with normal S1 and S2 and no gallops; no murmurs; no rub  Abdomen: Normoactive bowel sounds, soft, nontender, without masses or organomegaly palpable   Extremities: Trace pretibial edema. No tremor.   Skin: Normal texture and temperature to palpation. No rash noted.  Neuro: MS is good with appropriate affect, pt is alert and Ox3     DM foot exam: 05/16/2019 The skin of the feet is intact without sores or ulcerations.Bilateral planter callous formation noted and thickened, discolored toe nails The pedal pulses are 1+ on right and 1+ on left. The sensation is intact to a screening 5.07, 10 gram monofilament bilaterally            DATA REVIEWED:  Lab Results  Component Value Date   HGBA1C 12.2 (A) 05/16/2019   HGBA1C 12.2 (H) 10/31/2015  Lab Results  Component Value Date   LDLCALC 76 01/04/2019   CREATININE 1.82 (H) 01/20/2019    Lab Results  Component Value Date   CHOL 138 01/04/2019   HDL 22 (L) 01/04/2019   LDLCALC 76 01/04/2019   TRIG 199 (H) 01/04/2019   CHOLHDL 6.3 01/04/2019       ASSESSMENT / PLAN / RECOMMENDATIONS:   1) Type 2 Diabetes Mellitus, Poorly controlled, With CKD III complications and PVD - Most recent A1c of 13.1 %. Goal A1c < 7.0 %.   Plan:   - I have congratulated him on the amazing work he has been doing, he has been checking his glucose and taking insulin as prescribed, he also made drastic changes to his diet. - He is quite happy with his current regimen and believes he can continue on it without barriers.  - I am just concerned about his recurrent hypoglycemic episodes, I have strongly encouraged him in calling our office with recurrent hypoglycemic episodes to adjust his insulin.    MEDICATIONS: - Decrease Novolin Mix (70/30) 16 units with Breakfast and with supper    EDUCATION / INSTRUCTIONS:  BG monitoring instructions: Patient is instructed to check his blood sugars 2 times a day, before breakfast and supper.  Call [place] [facility] if: BG persistently < 70 or > 300. . I reviewed the Rule of 15 for the treatment of hypoglycemia in detail with the patient. Literature  supplied.    F/U in 3 months     Signed electronically by: Mack Guise, MD  [facility] Endocrinology  [facility] [address] [place], [place] [address] Phone: [phone] FAX: [phone]   CC: Glenda Chroman, MD [place] [address] Phone: [phone]  Fax: [phone]  Return to Endocrinology clinic as below: Future Appointments  Date Time Provider [place]  09/27/2019  9:10 AM Shamleffer, Melanie Crazier, MD [id number] None

## 2019-06-27 NOTE — Patient Instructions (Addendum)
-   Decrease Novolin -Mix to 16 units with Breakfast and 16 units with Supper   - Continue to check your sugar Twice a day, Before breakfast and supper    - HOW TO TREAT LOW BLOOD SUGARS (Blood sugar LESS THAN 70 MG/DL)  Please follow the RULE OF 15 for the treatment of hypoglycemia treatment (when your (blood sugars are less than 70 mg/dL)    STEP 1: Take 15 grams of carbohydrates when your blood sugar is low, which includes:   3-4 GLUCOSE TABS  OR  3-4 OZ OF JUICE OR REGULAR SODA OR  ONE TUBE OF GLUCOSE GEL     STEP 2: RECHECK blood sugar in 15 MINUTES STEP 3: If your blood sugar is still low at the 15 minute recheck --> then, go back to STEP 1 and treat AGAIN with another 15 grams of carbohydrates.

## 2019-08-16 DIAGNOSIS — I10 Essential (primary) hypertension: Secondary | ICD-10-CM | POA: Diagnosis not present

## 2019-08-16 DIAGNOSIS — Z1339 Encounter for screening examination for other mental health and behavioral disorders: Secondary | ICD-10-CM | POA: Diagnosis not present

## 2019-08-16 DIAGNOSIS — E1165 Type 2 diabetes mellitus with hyperglycemia: Secondary | ICD-10-CM | POA: Diagnosis not present

## 2019-08-16 DIAGNOSIS — I429 Cardiomyopathy, unspecified: Secondary | ICD-10-CM | POA: Diagnosis not present

## 2019-08-16 DIAGNOSIS — Z7189 Other specified counseling: Secondary | ICD-10-CM | POA: Diagnosis not present

## 2019-08-16 DIAGNOSIS — Z1331 Encounter for screening for depression: Secondary | ICD-10-CM | POA: Diagnosis not present

## 2019-08-16 DIAGNOSIS — I739 Peripheral vascular disease, unspecified: Secondary | ICD-10-CM | POA: Diagnosis not present

## 2019-08-16 DIAGNOSIS — Z1211 Encounter for screening for malignant neoplasm of colon: Secondary | ICD-10-CM | POA: Diagnosis not present

## 2019-08-16 DIAGNOSIS — Z6829 Body mass index (BMI) 29.0-29.9, adult: Secondary | ICD-10-CM | POA: Diagnosis not present

## 2019-08-16 DIAGNOSIS — Z299 Encounter for prophylactic measures, unspecified: Secondary | ICD-10-CM | POA: Diagnosis not present

## 2019-08-16 DIAGNOSIS — Z0000 Encounter for general adult medical examination without abnormal findings: Secondary | ICD-10-CM | POA: Diagnosis not present

## 2019-08-17 DIAGNOSIS — E7800 Pure hypercholesterolemia, unspecified: Secondary | ICD-10-CM | POA: Diagnosis not present

## 2019-08-17 DIAGNOSIS — Z125 Encounter for screening for malignant neoplasm of prostate: Secondary | ICD-10-CM | POA: Diagnosis not present

## 2019-08-17 DIAGNOSIS — Z79899 Other long term (current) drug therapy: Secondary | ICD-10-CM | POA: Diagnosis not present

## 2019-08-17 DIAGNOSIS — R5383 Other fatigue: Secondary | ICD-10-CM | POA: Diagnosis not present

## 2019-09-20 ENCOUNTER — Encounter: Payer: Self-pay | Admitting: Gastroenterology

## 2019-09-23 ENCOUNTER — Other Ambulatory Visit: Payer: Self-pay

## 2019-09-27 ENCOUNTER — Ambulatory Visit (INDEPENDENT_AMBULATORY_CARE_PROVIDER_SITE_OTHER): Payer: PPO | Admitting: Internal Medicine

## 2019-09-27 ENCOUNTER — Other Ambulatory Visit: Payer: Self-pay

## 2019-09-27 ENCOUNTER — Encounter: Payer: Self-pay | Admitting: Internal Medicine

## 2019-09-27 VITALS — BP 128/68 | HR 88 | Temp 98.1°F | Ht 65.0 in | Wt 180.8 lb

## 2019-09-27 DIAGNOSIS — E1165 Type 2 diabetes mellitus with hyperglycemia: Secondary | ICD-10-CM | POA: Diagnosis not present

## 2019-09-27 DIAGNOSIS — E1122 Type 2 diabetes mellitus with diabetic chronic kidney disease: Secondary | ICD-10-CM | POA: Diagnosis not present

## 2019-09-27 DIAGNOSIS — E118 Type 2 diabetes mellitus with unspecified complications: Secondary | ICD-10-CM

## 2019-09-27 DIAGNOSIS — Z794 Long term (current) use of insulin: Secondary | ICD-10-CM | POA: Diagnosis not present

## 2019-09-27 DIAGNOSIS — N183 Chronic kidney disease, stage 3 unspecified: Secondary | ICD-10-CM

## 2019-09-27 LAB — POCT GLYCOSYLATED HEMOGLOBIN (HGB A1C): Hemoglobin A1C: 6.5 % — AB (ref 4.0–5.6)

## 2019-09-27 MED ORDER — NOVOLIN 70/30 (70-30) 100 UNIT/ML ~~LOC~~ SUSP: 14.0000 [IU] | Freq: Two times a day (BID) | SUBCUTANEOUS | 6 refills | Status: DC

## 2019-09-27 NOTE — Patient Instructions (Signed)
-   Decrease Novolin -Mix to 14 units with Breakfast and 14 units with Supper      - HOW TO TREAT LOW BLOOD SUGARS (Blood sugar LESS THAN 70 MG/DL)  Please follow the RULE OF 15 for the treatment of hypoglycemia treatment (when your (blood sugars are less than 70 mg/dL)    STEP 1: Take 15 grams of carbohydrates when your blood sugar is low, which includes:   3-4 GLUCOSE TABS  OR  3-4 OZ OF JUICE OR REGULAR SODA OR  ONE TUBE OF GLUCOSE GEL     STEP 2: RECHECK blood sugar in 15 MINUTES STEP 3: If your blood sugar is still low at the 15 minute recheck --> then, go back to STEP 1 and treat AGAIN with another 15 grams of carbohydrates.

## 2019-09-27 NOTE — Progress Notes (Signed)
Name: Derek Mckenzie  Age/ Sex: 63 y.o., male   MRN/ DOB: [id number], 13-Feb-1956     PCP: Glenda Chroman, MD   Reason for Endocrinology Evaluation: Type 2 Diabetes Mellitus  Initial Endocrine Consultative Visit: 05/16/2019    PATIENT IDENTIFIER: Derek Mckenzie is a 63 y.o. male with a past medical history of HTN, T2DM, cardiomyopathy and dyslipidemia. The patient has followed with Endocrinology clinic since 05/16/2019 for consultative assistance with management of his diabetes.  DIABETIC HISTORY:  Derek Mckenzie was diagnosed with T2DM > 20 yrs ago. He does not recall prior oral glycemic agents, he has been on insulin for years.  His hemoglobin A1c has ranged from 12.2% in 2016, peaking at 13.1% in  2019   On his initial visit to our clinic, his A1c was 12.2% and he was on Novolin- N only. We started him on Novolin- Mix   SUBJECTIVE:   During the last visit (06/27/2019): A1c 12.2 % started novolin- Mix (70/30)   Today (09/27/2019): Mr. Demchak is here for a 3 month follow up on diabetes management, he is accompanied by his roommate Derek Mckenzie.  He checks his blood sugars 2 times daily, preprandial to breakfast and supper. The patient has  had hypoglycemic episodes since the last clinic visit, which typically occur 1 x / week- most often occuring at supper time. The patient is  symptomatic with these episodes, with symptoms of shaking. Otherwise, the patient has not required any recent emergency interventions for hypoglycemia and has not had recent hospitalizations secondary to hyper or hypoglycemic episodes.    ROS: As per HPI and as detailed below: Review of Systems  Constitutional: Negative for fever and weight loss.  HENT: Negative for congestion and sore throat.   Respiratory: Negative for cough and shortness of breath.   Cardiovascular: Negative for chest pain and palpitations.  Gastrointestinal: Negative for diarrhea and nausea.      HOME DIABETES REGIMEN:  Novolin Mix  (70/30) 16 units BID with Breakfast and supper      METER DOWNLOAD SUMMARY:  9/16-9/29/20 Fingerstick Blood Glucose Tests = 27 Average Number Tests/Day = 1.9 Overall Mean FS Glucose = 135   BG Ranges: Low = 68 High = 275  Hypoglycemic Events/30 Days: BG < 50 = 0 Episodes of symptomatic severe hypoglycemia = 0   HISTORY:  Past Medical History:  Past Medical History:  Diagnosis Date  . CHF (congestive heart failure) ([place])   . Deafness in left ear   . ED (erectile dysfunction)   . Hearing loss in right ear   . Hyperlipidemia   . Hypertension   . PAD (peripheral artery disease) ([place])   . Type II diabetes mellitus ([place])    Past Surgical History:  Past Surgical History:  Procedure Laterality Date  . CATARACT EXTRACTION W/PHACO Left 03/11/2016   Procedure: CATARACT EXTRACTION PHACO AND INTRAOCULAR LENS PLACEMENT (IOC);  Surgeon: Rutherford Guys, MD;  Location: [facility];  Service: Ophthalmology;  Laterality: Left;  CDE:2.71  . CATARACT EXTRACTION W/PHACO Right 05/20/2016   Procedure: CATARACT EXTRACTION PHACO AND INTRAOCULAR LENS PLACEMENT (IOC);  Surgeon: Rutherford Guys, MD;  Location: [facility];  Service: Ophthalmology;  Laterality: Right;  CDE: 4.33  . RETINAL LASER PROCEDURE    . VASECTOMY    . YAG LASER APPLICATION Right [id number]   Procedure: YAG LASER APPLICATION;  Surgeon: Rutherford Guys, MD;  Location: [facility];  Service: Ophthalmology;  Laterality: Right;    Social History:  reports that he has quit  smoking. His smoking use included cigarettes. He has a 17.50 pack-year smoking history. He has never used smokeless tobacco. He reports that he does not drink alcohol or use drugs. Family History:  Family History  Problem Relation Age of Onset  . Hypertension Mother   . Stroke Mother   . Heart disease Father   . Other Neg Hx        gynecomastia     HOME MEDICATIONS: Allergies as of 09/27/2019      Reactions   Atenolol Shortness Of Breath   Amlodipine Besy-benazepril Hcl Other  (See Comments)   Headaches      Medication List       Accurate as of September 27, 2019 12:32 PM. If you have any questions, ask your nurse or doctor.        amLODipine 10 MG tablet Commonly known as: NORVASC TAKE 1 TABLET BY MOUTH ONCE DAILY What changed: how much to take   aspirin EC 81 MG tablet Take 1 tablet (81 mg total) by mouth daily.   atorvastatin 80 MG tablet Commonly known as: LIPITOR TAKE 1 TABLET ONCE DAILY.   [name] 20-37.5 MG tablet Generic drug: isosorbide-hydrALAZINE TAKE (2) TABLETS BY MOUTH THREE TIMES DAILY.   carvedilol 25 MG tablet Commonly known as: COREG Take 25 mg by mouth 2 (two) times daily with a meal.   fenofibrate 145 MG tablet Commonly known as: TRICOR TAKE 1 TABLET ONCE DAILY.   folic acid 1 MG tablet Commonly known as: FOLVITE Take 400 mcg by mouth daily.   latanoprost 0.005 % ophthalmic solution Commonly known as: XALATAN Place 1 drop into both eyes at bedtime.   Lokelma 10 g Pack packet Generic drug: sodium zirconium cyclosilicate   NovoLIN 0000000 (70-30) 100 UNIT/ML injection Generic drug: insulin NPH-regular Human Inject 14 Units into the skin 2 (two) times daily with a meal. What changed: how much to take Changed by: [name], MD   sacubitril-valsartan 49-51 MG Commonly known as: Entresto Take 1 tablet by mouth 2 (two) times daily.   tobramycin-dexamethasone ophthalmic solution Commonly known as: TOBRADEX Instill one drop OD QID x 5 days   vitamin B-12 500 MCG tablet Commonly known as: CYANOCOBALAMIN Take 500 mcg by mouth daily.        OBJECTIVE:   Vital Signs: BP 128/68 (BP Location: Left Arm, Patient Position: Sitting, Cuff Size: Large)   Pulse 88   Temp 98.1 F (36.7 C)   Ht 5\' 5"  (1.651 m)   Wt 180 lb 12.8 oz (82 kg)   SpO2 98%   BMI 30.09 kg/m   Wt Readings from Last 3 Encounters:  09/27/19 180 lb 12.8 oz (82 kg)  06/27/19 184 lb 12.8 oz (83.8 kg)  05/16/19 182 lb 4.8 oz (82.7 kg)      Exam: General: Pt appears well and is in NAD  Lungs: Clear with good BS bilat with no rales, rhonchi, or wheezes  Heart: RRR with normal S1 and S2 and no gallops; no murmurs; no rub  Abdomen: Normoactive bowel sounds, soft, nontender, without masses or organomegaly palpable  Extremities: Trace pretibial edema. No tremor.   Skin: Normal texture and temperature to palpation. No rash noted.  Neuro: MS is good with appropriate affect, pt is alert and Ox3     DM foot exam: 05/16/2019 The skin of the feet is intact without sores or ulcerations.Bilateral planter callous formation noted and thickened, discolored toe nails The pedal pulses are 1+ on right and  1+ on left. The sensation is intact to a screening 5.07, 10 gram monofilament bilaterally            DATA REVIEWED:  Lab Results  Component Value Date   HGBA1C 6.5 (A) 09/27/2019   HGBA1C 12.2 (A) 05/16/2019   HGBA1C 12.2 (H) 10/31/2015   Lab Results  Component Value Date   LDLCALC 76 01/04/2019   CREATININE 1.82 (H) 01/20/2019    Lab Results  Component Value Date   CHOL 138 01/04/2019   HDL 22 (L) 01/04/2019   LDLCALC 76 01/04/2019   TRIG 199 (H) 01/04/2019   CHOLHDL 6.3 01/04/2019       ASSESSMENT / PLAN / RECOMMENDATIONS:   1) Type 2 Diabetes Mellitus, Poorly controlled, With CKD III complications and PVD - Most recent A1c of 6.5 %. Goal A1c < 7.0 %.     - I have congratulated him on the amazing work he has done thus far.  - We will reduce his insulin dose as below  MEDICATIONS: - Decrease Novolin Mix (70/30) 14 units with Breakfast and with supper    EDUCATION / INSTRUCTIONS:  BG monitoring instructions: Patient is instructed to check his blood sugars 2 times a day, before breakfast and supper.  Call [facility] if: BG persistently < 70 or > 300. . I reviewed the Rule of 15 for the treatment of hypoglycemia in detail with the patient. Literature supplied.    F/U in 3 months      Signed electronically by: Mack Guise, MD  [facility]  [facility] [address] [place], [place] [address] Phone: [phone] FAX: [phone]   CC: Glenda Chroman, MD [place] [address] Phone: [phone]  Fax: [phone]  Return to Endocrinology clinic as below: Future Appointments  Date Time Provider [place]  10/12/2019  2:00 PM Danie Binder, MD [facility]  01/31/2020  9:30 AM Shamleffer, Melanie Crazier, MD [facility] None

## 2019-10-03 ENCOUNTER — Ambulatory Visit (HOSPITAL_COMMUNITY)
Admission: RE | Admit: 2019-10-03 | Discharge: 2019-10-03 | Disposition: A | Discharge status: Home / Self Care | Payer: PPO | Source: Ambulatory Visit | Attending: Cardiology | Admitting: Cardiology

## 2019-10-03 ENCOUNTER — Other Ambulatory Visit: Payer: Self-pay

## 2019-10-03 VITALS — BP 138/60 | HR 85 | Wt 181.8 lb

## 2019-10-03 DIAGNOSIS — E1151 Type 2 diabetes mellitus with diabetic peripheral angiopathy without gangrene: Secondary | ICD-10-CM | POA: Insufficient documentation

## 2019-10-03 DIAGNOSIS — Z79899 Other long term (current) drug therapy: Secondary | ICD-10-CM | POA: Diagnosis not present

## 2019-10-03 DIAGNOSIS — Z8249 Family history of ischemic heart disease and other diseases of the circulatory system: Secondary | ICD-10-CM | POA: Insufficient documentation

## 2019-10-03 DIAGNOSIS — I130 Hypertensive heart and chronic kidney disease with heart failure and stage 1 through stage 4 chronic kidney disease, or unspecified chronic kidney disease: Secondary | ICD-10-CM | POA: Insufficient documentation

## 2019-10-03 DIAGNOSIS — H9190 Unspecified hearing loss, unspecified ear: Secondary | ICD-10-CM | POA: Diagnosis not present

## 2019-10-03 DIAGNOSIS — Z7982 Long term (current) use of aspirin: Secondary | ICD-10-CM | POA: Insufficient documentation

## 2019-10-03 DIAGNOSIS — E1122 Type 2 diabetes mellitus with diabetic chronic kidney disease: Secondary | ICD-10-CM | POA: Diagnosis not present

## 2019-10-03 DIAGNOSIS — I739 Peripheral vascular disease, unspecified: Secondary | ICD-10-CM | POA: Diagnosis not present

## 2019-10-03 DIAGNOSIS — Z794 Long term (current) use of insulin: Secondary | ICD-10-CM | POA: Insufficient documentation

## 2019-10-03 DIAGNOSIS — E785 Hyperlipidemia, unspecified: Secondary | ICD-10-CM | POA: Insufficient documentation

## 2019-10-03 DIAGNOSIS — Z87891 Personal history of nicotine dependence: Secondary | ICD-10-CM | POA: Insufficient documentation

## 2019-10-03 DIAGNOSIS — I5022 Chronic systolic (congestive) heart failure: Secondary | ICD-10-CM | POA: Insufficient documentation

## 2019-10-03 DIAGNOSIS — N183 Chronic kidney disease, stage 3 unspecified: Secondary | ICD-10-CM | POA: Insufficient documentation

## 2019-10-03 LAB — LIPID PANEL
Cholesterol: 112 mg/dL (ref 0–200)
HDL: 26 mg/dL — ABNORMAL LOW (ref >40)
LDL Cholesterol: 70 mg/dL (ref 0–99)
Total CHOL/HDL Ratio: 4.3 RATIO
Triglycerides: 78 mg/dL (ref <150)
VLDL: 16 mg/dL (ref 0–40)

## 2019-10-03 LAB — BASIC METABOLIC PANEL
Anion gap: 3 — ABNORMAL LOW (ref 5–15)
BUN: 29 mg/dL — ABNORMAL HIGH (ref 8–23)
CO2: 24 mmol/L (ref 22–32)
Calcium: 8.7 mg/dL — ABNORMAL LOW (ref 8.9–10.3)
Chloride: 113 mmol/L — ABNORMAL HIGH (ref 98–111)
Creatinine, Ser: 1.91 mg/dL — ABNORMAL HIGH (ref 0.61–1.24)
GFR calc Af Amer: 42 mL/min — ABNORMAL LOW (ref >60)
GFR calc non Af Amer: 36 mL/min — ABNORMAL LOW (ref >60)
Glucose, Bld: 190 mg/dL — ABNORMAL HIGH (ref 70–99)
Potassium: 4.2 mmol/L (ref 3.5–5.1)
Sodium: 140 mmol/L (ref 135–145)

## 2019-10-03 NOTE — Patient Instructions (Addendum)
You were provided samples for [name] and ENtresto today.   Your physician has requested that you have an echocardiogram. Echocardiography is a painless test that uses sound waves to create images of your heart. It provides your doctor with information about the size and shape of your heart and how well your heart's chambers and valves are working. This procedure takes approximately one hour. There are no restrictions for this procedure.  Labs today We will only contact you if something comes back abnormal or we need to make some changes. Otherwise no news is good news!  Your physician recommends that you schedule a follow-up appointment in: 6 months with Dr Aundra Dubin.  Our office will call you to schedule this appointment.   At the [facility], you and your health needs are our priority. As part of our continuing mission to provide you with exceptional heart care, we have created designated Provider Care Teams. These Care Teams include your primary Cardiologist (physician) and Advanced Practice Providers (APPs- Physician Assistants and Nurse Practitioners) who all work together to provide you with the care you need, when you need it.   You may see any of the following providers on your designated Care Team at your next follow up: Marland Kitchen Dr Glori Bickers . Dr Loralie Champagne . Darrick Grinder, NP   Please be sure to bring in all your medications bottles to every appointment.   Please e-mail the banking statements for the last 3 months or a copy of the most recently filed tax return or W2 form to [email or web address]. Or bring these documents into clinic and give to the pharmacist.

## 2019-10-03 NOTE — Progress Notes (Signed)
Medication Samples have been provided to the patient.  Drug name: [name]       Strength: 20/37.5mg         Qty: 36  LOT: [id number]  Exp.Date: 1/22  Dosing instructions: 2  tabs  Three times a day  The patient has been instructed regarding the correct time, dose, and frequency of taking this medication, including desired effects and most common side effects.    Medication Samples have been provided to the patient.  Drug name: [name]       Strength: 49/51mg         Qty: 56  LOT: [id number]  Exp.Date: 6/22  Dosing instructions: 1 tab twice a day  The patient has been instructed regarding the correct time, dose, and frequency of taking this medication, including desired effects and most common side effects.   Valeda Malm 3:33 PM 10/03/2019

## 2019-10-04 ENCOUNTER — Other Ambulatory Visit (HOSPITAL_COMMUNITY): Payer: Self-pay

## 2019-10-04 MED ORDER — BIDIL 20-37.5 MG PO TABS: 2.0000 | ORAL_TABLET | Freq: Three times a day (TID) | ORAL | 3 refills | Status: DC

## 2019-10-04 NOTE — Telephone Encounter (Signed)
New rx printed for bidil for pt assistance application

## 2019-10-04 NOTE — Progress Notes (Signed)
Patient ID: Derek Mckenzie, male   DOB: 1956-03-28, 63 y.o.   MRN: [id number] PCP: Dr. Woody Seller HF Cardiology: Dr. Aundra Dubin  63 y.o. with history of HTN, DM, PAD, CKD and systolic CHF presents for CHF clinic followup.  He has had a long history of difficult-to-control HTN.  Medications were adjusted and repeat echo in 12/17 showed improvement in EF to 60-65%.  He has known PAD, significant on 12/17 peripheral arterial dopplers.  He saw Dr. Gwenlyn Found for evaluation and conservative treatment for now was recommended.  Echo in 12/18 showed EF 60-65%, moderate LVH.    Patient returns for followup of CHF and HTN. Very hard of hearing.  BP has been well-controlled recently.  No claudication or pedal ulcers, 1/20 ABIs abnormal but stable.  No chest pain.  No significant exertional dyspnea.  No orthopnea/PND.  Off spironolactone due to hyperkalemia.   Labs (11/16): LDL 109, HDL 26, K 4.1, creatinine 1.42, BNP 197, TSH normal Labs (3/17): K 4.2, creatinine 1.73 Labs (12/17): K 5.4, creatinine 1.5, TGs 448, unable to calculate LDL, BNP 51, hgb 12.4 Labs (1/18): K 5.1, creatinine 1.62 Labs (7/18): LDL 84, HDL 21, TGs 215 Labs (8/18): K 5.1, creatinine 1.84 Labs (10/18): LDL 64, HDL 22, LFTs normal Labs (12/18): K 4.8, creatinine 1.85 Labs (1/20): K 5.1, creatinine 1.82  PMH:  1. Type II diabetes. 2. HTN: x years, poorly controlled. 11/16 renal artery dopplers with no significant stenosis.  3. Deafness 4. PAD: 2012 peripheral arterial dopplers with right distal SFA stenosis and left mid SFA stenosis.  - Peripheral arterial dopplers (12/17): Probable right iliac obstruction and distal left SFA obstruction.  - ABIs (12/18): 0.61 right, 0.9 left (improved).  - ABIs (1/20): 0.62 right, 0.85 left 5. Sleep study negative for OSA 6. Chronic systolic CHF: Echo ([id number]) with EF 30-35%, moderate LVH.  Possible hypertensive cardiomyopathy.  - Echo (12/17): EF 60-65%.  - Echo (12/18): EF 60-65%, moderate LVH.  7. CKD:  Stage 3.  8. Hyperlipidemia  FH: No cardiac disease that he knows of.  +HTN.   Social History   Socioeconomic History  . Marital status: Single    Spouse name: Not on file  . Number of children: Not on file  . Years of education: Not on file  . Highest education level: Not on file  Occupational History  . Not on file  Social Needs  . Financial resource strain: Not on file  . Food insecurity    Worry: Not on file    Inability: Not on file  . Transportation needs    Medical: Not on file    Non-medical: Not on file  Tobacco Use  . Smoking status: Former Smoker    Packs/day: 0.50    Years: 35.00    Pack years: 17.50    Types: Cigarettes  . Smokeless tobacco: Never Used  . Tobacco comment: "quit smoking in ~ 2007"  Substance and Sexual Activity  . Alcohol use: No  . Drug use: No  . Sexual activity: Never  Lifestyle  . Physical activity    Days per week: Not on file    Minutes per session: Not on file  . Stress: Not on file  Relationships  . Social Herbalist on phone: Not on file    Gets together: Not on file    Attends religious service: Not on file    Active member of club or organization: Not on file    Attends meetings of clubs  or organizations: Not on file    Relationship status: Not on file  . Intimate partner violence    Fear of current or ex partner: Not on file    Emotionally abused: Not on file    Physically abused: Not on file    Forced sexual activity: Not on file  Other Topics Concern  . Not on file  Social History Narrative  . Not on file   ROS: All systems reviewed and negative except as per HPI.   Current Outpatient Medications  Medication Sig Dispense Refill  . amLODipine (NORVASC) 10 MG tablet TAKE 1 TABLET BY MOUTH ONCE DAILY (Patient taking differently: Take 5 mg by mouth daily. ) 30 tablet 3  . aspirin EC 81 MG tablet Take 1 tablet (81 mg total) by mouth daily. 30 tablet 3  . atorvastatin (LIPITOR) 80 MG tablet TAKE 1 TABLET  ONCE DAILY. 30 tablet 11  . [name] 20-37.5 MG tablet TAKE (2) TABLETS BY MOUTH THREE TIMES DAILY. 180 tablet 5  . carvedilol (COREG) 25 MG tablet Take 25 mg by mouth 2 (two) times daily with a meal.    . fenofibrate (TRICOR) 145 MG tablet TAKE 1 TABLET ONCE DAILY. 30 tablet 11  . folic acid (FOLVITE) 1 MG tablet Take 400 mcg by mouth daily.     . insulin NPH-regular Human (NOVOLIN 70/30) (70-30) 100 UNIT/ML injection Inject 14 Units into the skin 2 (two) times daily with a meal. 20 mL 6  . latanoprost (XALATAN) 0.005 % ophthalmic solution Place 1 drop into both eyes at bedtime.    . sacubitril-valsartan (ENTRESTO) 49-51 MG Take 1 tablet by mouth 2 (two) times daily. 180 tablet 4  . tobramycin-dexamethasone (TOBRADEX) ophthalmic solution Instill one drop OD QID x 5 days    . vitamin B-12 (CYANOCOBALAMIN) 500 MCG tablet Take 500 mcg by mouth daily.     No current facility-administered medications for this encounter.    BP 138/60   Pulse 85   Wt 82.5 kg (181 lb 12.8 oz)   SpO2 98%   BMI 30.25 kg/m  General: NAD Neck: No JVD, no thyromegaly or thyroid nodule.  Lungs: Clear to auscultation bilaterally with normal respiratory effort. CV: Nondisplaced PMI.  Heart regular S1/S2, no S3/S4, no murmur.  Trace ankle edema.  No carotid bruit.  Normal pedal pulses.  Abdomen: Soft, nontender, no hepatosplenomegaly, no distention.  Skin: Intact without lesions or rashes.  Neurologic: Alert and oriented x 3.  Psych: Normal affect. Extremities: No clubbing or cyanosis.  HEENT: Normal.   Assessment/Plan: 1. Chronic systolic CHF: Echo [id number] with EF 30-35%.  Given improvement in EF to 60-65% in 12/17 and 12/18 with BP control, suspect this was a hypertensive cardiomyopathy.  NYHA class II, not volume overloaded on exam.  - Continue, Entresto, [name], and Coreg.  BMET today.  - He is off spironolactone due to hyperkalemia.  - I will arrange for repeat echo to make sure that EF remains normal.  2. HTN:  Renal arterial dopplers in 11/16 did not show evidence for renal artery stenosis.  BP has been much improved recently.   - No med changes today.    3. PAD: No claudication.  ABIs abnormal in 1/20 but stable.     - Continue ASA 81 and statin.   - Conservative treatment for now per Dr. Gwenlyn Found.   4. Hyperlipidemia: Check lipids today.   5. CKD: Stage 3.  BMET today.   Followup in 6 months.  Loralie Champagne 10/04/2019

## 2019-10-04 NOTE — Addendum Note (Signed)
Addended by: Valeda Malm on: 10/04/2019 10:59 AM   Modules accepted: Orders

## 2019-10-06 ENCOUNTER — Ambulatory Visit: Payer: PPO | Admitting: Gastroenterology

## 2019-10-07 ENCOUNTER — Telehealth (HOSPITAL_COMMUNITY): Payer: Self-pay | Admitting: Pharmacist

## 2019-10-07 NOTE — Telephone Encounter (Signed)
Spoke with patient in clinic about issues affording [name] and Entresto. He currently has [place] for both medications. The current grant balance is $1,837.11. The grant will be active until 12/15/2019. Called patient's pharmacy and both medications have a $0.00 copay. Will not submit for Manufacturer's Assistance at this time, given medications are affordable. Will follow-up in December to re-apply for grant.   See Social Work telephone note from 01/14/2019 for information regarding the grant.   Audry Riles, PharmD, BCPS, CPP Heart Failure Clinic Pharmacist [phone]

## 2019-10-12 ENCOUNTER — Other Ambulatory Visit: Payer: Self-pay

## 2019-10-12 ENCOUNTER — Ambulatory Visit (INDEPENDENT_AMBULATORY_CARE_PROVIDER_SITE_OTHER): Payer: PPO | Admitting: Gastroenterology

## 2019-10-12 ENCOUNTER — Encounter: Payer: Self-pay | Admitting: Gastroenterology

## 2019-10-12 DIAGNOSIS — R195 Other fecal abnormalities: Secondary | ICD-10-CM | POA: Insufficient documentation

## 2019-10-12 MED ORDER — PEG 3350-KCL-NA BICARB-NACL 420 G PO SOLR: 4000.0000 mL | ORAL | 0 refills | Status: DC

## 2019-10-12 NOTE — Progress Notes (Signed)
Subjective:    Patient ID: Derek Mckenzie, male    DOB: 1956-03-11, 63 y.o.   MRN: [id number] Glenda Chroman, MD  PT [facility]. HIS GIRLFRIEND ANSWERS MEDICAL QUESTIONS. HIS RIGHT EAR IS THE BEST EAR.  HPI Pt had heme positive stool. BMs: EVERY DAY AND MAY GO 5 DAYS PRIOR TO MOVING. MEDICINES MAKE HIM CONSTIPATED.  PT DENIES FEVER, CHILLS, HEMATOCHEZIA, HEMATEMESIS, nausea, vomiting, melena, diarrhea, CHEST PAIN, SHORTNESS OF BREATH,  CHANGE IN BOWEL IN HABITS, constipation, abdominal pain, problems swallowing, problems with sedation, OR heartburn or indigestion.  Past Medical History:  Diagnosis Date  . CHF (congestive heart failure) ([place])   . Deafness in left ear   . ED (erectile dysfunction)   . Hearing loss in right ear   . Hyperlipidemia   . Hypertension   . PAD (peripheral artery disease) ([place])   . Type II diabetes mellitus ([place])    Past Surgical History:  Procedure Laterality Date  . CATARACT EXTRACTION W/PHACO Left 03/11/2016   Procedure: CATARACT EXTRACTION PHACO AND INTRAOCULAR LENS PLACEMENT (IOC);  Surgeon: Rutherford Guys, MD;  Location: [facility];  Service: Ophthalmology;  Laterality: Left;  CDE:2.71  . CATARACT EXTRACTION W/PHACO Right 05/20/2016   Procedure: CATARACT EXTRACTION PHACO AND INTRAOCULAR LENS PLACEMENT (IOC);  Surgeon: Rutherford Guys, MD;  Location: [facility];  Service: Ophthalmology;  Laterality: Right;  CDE: 4.33  . RETINAL LASER PROCEDURE    . VASECTOMY    . YAG LASER APPLICATION Right [id number]   Procedure: YAG LASER APPLICATION;  Surgeon: Rutherford Guys, MD;  Location: [facility];  Service: Ophthalmology;  Laterality: Right;   Allergies  Allergen Reactions  . Atenolol Shortness Of Breath  . Amlodipine Besy-Benazepril Hcl Other (See Comments)    Headaches    Current Outpatient Medications  Medication Sig    . Alpha-Lipoic Acid 200 MG CAPS Take by mouth daily.    Marland Kitchen amLODipine (NORVASC) 10 MG tablet TAKE 1 TABLET BY MOUTH ONCE DAILY (Patient taking  differently: Take 5 mg by mouth daily. )    . aspirin EC 81 MG tablet Take 1 tablet (81 mg total) by mouth daily.    Marland Kitchen atorvastatin (LIPITOR) 80 MG tablet TAKE 1 TABLET ONCE DAILY.    . carvedilol (COREG) 25 MG tablet Take 25 mg by mouth 2 (two) times daily with a meal.    . fenofibrate (TRICOR) 145 MG tablet TAKE 1 TABLET ONCE DAILY.    . folic acid (FOLVITE) 1 MG tablet Take 400 mcg by mouth daily.     . insulin NPH-regular Human (NOVOLIN 70/30) (70-30) 100 UNIT/ML injection Inject 14 Units into the skin 2 (two) times daily with a meal.    . isosorbide-hydrALAZINE ([name]) 20-37.5 MG tablet Take 2 tablets by mouth 3 (three) times daily.    Marland Kitchen latanoprost (XALATAN) 0.005 % ophthalmic solution Place 1 drop into both eyes at bedtime.    . sacubitril-valsartan (ENTRESTO) 49-51 MG Take 1 tablet by mouth 2 (two) times daily.    Marland Kitchen tobramycin-dexamethasone (TOBRADEX) ophthalmic solution Instill one drop OD QID x 5 days    . vitamin B-12 (CYANOCOBALAMIN) 500 MCG tablet Take 500 mcg by mouth daily.      Family History  Problem Relation Age of Onset  . Hypertension Mother   . Stroke Mother   . Heart disease Father   . Other Neg Hx        gynecomastia  . Colon cancer Neg Hx   . Colon polyps Neg Hx  Social History   Socioeconomic History  . Marital status: Single    Spouse name: Not on file  . Number of children: Not on file  . Years of education: Not on file  . Highest education level: Not on file  Occupational History  . Not on file  Social Needs  . Financial resource strain: Not on file  . Food insecurity    Worry: Not on file    Inability: Not on file  . Transportation needs    Medical: Not on file    Non-medical: Not on file  Tobacco Use  . Smoking status: Former Smoker    Packs/day: 0.50    Years: 35.00    Pack years: 17.50    Types: Cigarettes  . Smokeless tobacco: Never Used  . Tobacco comment: "quit smoking in ~ 2007"  Substance and Sexual Activity  . Alcohol use:  No  . Drug use: No  . Sexual activity: Never  Lifestyle  . Physical activity    Days per week: Not on file    Minutes per session: Not on file  . Stress: Not on file  Relationships  . Social Herbalist on phone: Not on file    Gets together: Not on file    Attends religious service: Not on file    Active member of club or organization: Not on file    Attends meetings of clubs or organizations: Not on file    Relationship status: Not on file  Other Topics Concern  . Not on file  Social History Narrative   IN A LONG TERM PLATONIC RELATIONSHIP. BEEN GOOD FRIENDS FOR YEARS AND TAKING CARE OF EACH OTHER. SEES KIDS VERY RARELY. 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 HEARING AID DID NOT WORK.   Review of Systems PER HPI OTHERWISE ALL SYSTEMS ARE NEGATIVE.    Objective:   Physical Exam Constitutional:      General: He is not in acute distress.    Appearance: Normal appearance.  HENT:     Mouth/Throat:     Comments: MASK IN PLACE Eyes:     General: No scleral icterus.    Pupils: Pupils are equal, round, and reactive to light.  Neck:     Musculoskeletal: Normal range of motion.  Cardiovascular:     Rate and Rhythm: Normal rate and regular rhythm.     Pulses: Normal pulses.     Heart sounds: Normal heart sounds.  Pulmonary:     Effort: Pulmonary effort is normal.     Breath sounds: Normal breath sounds.  Abdominal:     General: Bowel sounds are normal.     Palpations: Abdomen is soft.     Tenderness: There is no abdominal tenderness.  Musculoskeletal:     Right lower leg: No edema.     Left lower leg: No edema.  Lymphadenopathy:     Cervical: No cervical adenopathy.  Skin:    General: Skin is warm and dry.  Neurological:     Mental Status: He is alert and oriented to person, place, and time.     Comments: HARD OF HEARING, NO  NEW FOCAL DEFICITS  Psychiatric:        Mood and Affect: Mood normal.     Comments: NORMAL AFFECT         Assessment & Plan:

## 2019-10-12 NOTE — Assessment & Plan Note (Addendum)
NO BRBPR OR MELENA. DIFFERENTIAL DIAGNOSIS INCLUDES HEMORRHOIDS, COLON POLYPS, AVMs, & LESS LIKELY COLON CA. No PREVIOUS COLONOSCOPY.  DRINK WATER TO KEEP YOUR URINE LIGHT YELLOW. FOLLOW A HIGH FIBER DIET.  HANDOUT GIVEN. COMPLETE COLONOSCOPY. DISCUSSED PROCEDURE, BENEFITS, & RISKS: < 1% chance of medication reaction, bleeding, perforation, ASPIRATION, or rupture of spleen/liver requiring surgery to fix it and missed polyps < 1 cm 10-20% of the time. ADJUST INSULIN-HALF DOSE EVENING BEFORE, AND 3 UNITS INSULIN IF BG > 200.  FOLLOW UP IN THE OFFICE WILL BE SCHEDULED IF NEEDED AFTER ENDOSCOPY.

## 2019-10-12 NOTE — Patient Instructions (Signed)
DRINK WATER TO KEEP YOUR URINE LIGHT YELLOW.  FOLLOW A HIGH FIBER DIET. SEE INFO BELOW.  COMPLETE COLONOSCOPY.  FOLLOW UP IN THE OFFICE WILL BE SCHEDULED IF NEEDED AFTER ENDOSCOPY.   High-Fiber Diet A high-fiber diet changes your normal diet to include more whole grains, legumes, fruits, and vegetables. Changes in the diet involve replacing refined carbohydrates with unrefined foods. The calorie level of the diet is essentially unchanged. The Dietary Reference Intake (recommended amount) for adult males is 38 grams per day. For adult females, it is 25 grams per day. Pregnant and lactating women should consume 28 grams of fiber per day. Fiber is the intact part of a plant that is not broken down during digestion. Functional fiber is fiber that has been isolated from the plant to provide a beneficial effect in the body.  PURPOSE  Increase stool bulk.   Ease and regulate bowel movements.   Lower cholesterol.   REDUCE RISK OF COLON CANCER  INDICATIONS THAT YOU NEED MORE FIBER  Constipation and hemorrhoids.   Uncomplicated diverticulosis (intestine condition) and irritable bowel syndrome.   Weight management.   As a protective measure against hardening of the arteries (atherosclerosis), diabetes, and cancer.   GUIDELINES FOR INCREASING FIBER IN THE DIET  Start adding fiber to the diet slowly. A gradual increase of about 5 more grams (2 servings of most fruits or vegetables) per day is best. Too rapid an increase in fiber may result in constipation, flatulence, and bloating.   Drink enough water and fluids to keep your urine clear or pale yellow. Water, juice, or caffeine-free drinks are recommended. Not drinking enough fluid may cause constipation.   Eat a variety of high-fiber foods rather than one type of fiber.   Try to increase your intake of fiber through using high-fiber foods rather than fiber pills or supplements that contain small amounts of fiber.   The goal is to  change the types of food eaten. Do not supplement your present diet with high-fiber foods, but replace foods in your present diet.

## 2019-11-03 ENCOUNTER — Other Ambulatory Visit: Payer: Self-pay

## 2019-11-03 ENCOUNTER — Ambulatory Visit (HOSPITAL_COMMUNITY)
Admission: RE | Admit: 2019-11-03 | Discharge: 2019-11-03 | Disposition: A | Discharge status: Home / Self Care | Payer: PPO | Source: Ambulatory Visit | Attending: Internal Medicine | Admitting: Internal Medicine

## 2019-11-03 DIAGNOSIS — I5022 Chronic systolic (congestive) heart failure: Secondary | ICD-10-CM | POA: Diagnosis not present

## 2019-11-03 NOTE — Progress Notes (Signed)
  Echocardiogram 2D Echocardiogram has been performed.  Jennette Dubin 11/03/2019, 10:59 AM

## 2019-11-04 ENCOUNTER — Other Ambulatory Visit (HOSPITAL_COMMUNITY): Payer: Self-pay | Admitting: Cardiology

## 2019-11-18 ENCOUNTER — Encounter (HOSPITAL_COMMUNITY): Payer: PPO | Admitting: Cardiology

## 2019-12-02 ENCOUNTER — Other Ambulatory Visit (HOSPITAL_COMMUNITY): Payer: Self-pay | Admitting: Cardiology

## 2019-12-05 DIAGNOSIS — H401132 Primary open-angle glaucoma, bilateral, moderate stage: Secondary | ICD-10-CM | POA: Diagnosis not present

## 2019-12-19 DIAGNOSIS — Z299 Encounter for prophylactic measures, unspecified: Secondary | ICD-10-CM | POA: Diagnosis not present

## 2019-12-19 DIAGNOSIS — E1165 Type 2 diabetes mellitus with hyperglycemia: Secondary | ICD-10-CM | POA: Diagnosis not present

## 2019-12-19 DIAGNOSIS — I739 Peripheral vascular disease, unspecified: Secondary | ICD-10-CM | POA: Diagnosis not present

## 2019-12-19 DIAGNOSIS — E113513 Type 2 diabetes mellitus with proliferative diabetic retinopathy with macular edema, bilateral: Secondary | ICD-10-CM | POA: Diagnosis not present

## 2019-12-19 DIAGNOSIS — Z794 Long term (current) use of insulin: Secondary | ICD-10-CM | POA: Diagnosis not present

## 2020-01-09 ENCOUNTER — Telehealth (HOSPITAL_COMMUNITY): Payer: Self-pay | Admitting: Licensed Clinical Social Worker

## 2020-01-09 NOTE — Telephone Encounter (Signed)
CSW called pt to discuss applying for [employer] foundation grant as his [employer] has expired.  Pt confirms he is agreeable- pt approved for assistance for entresto and bidil  Member ID: [id number] Group ID: [id number] RxBin ID: [id number] PCN: [id number] Eligibility Start Date: 10/11/2019 Eligibility End Date: 10/09/2020 Assistance Amount: $1,000.00  CSW called and provided to pt pharmacy  Jorge Ny, [facility] Desk#: [phone] Cell#: [phone]

## 2020-01-18 ENCOUNTER — Other Ambulatory Visit: Payer: Self-pay

## 2020-01-18 ENCOUNTER — Other Ambulatory Visit (HOSPITAL_COMMUNITY)
Admission: RE | Admit: 2020-01-18 | Discharge: 2020-01-18 | Disposition: A | Discharge status: Home / Self Care | Payer: PPO | Source: Ambulatory Visit | Attending: Gastroenterology | Admitting: Gastroenterology

## 2020-01-18 DIAGNOSIS — Z20822 Contact with and (suspected) exposure to covid-19: Secondary | ICD-10-CM | POA: Insufficient documentation

## 2020-01-18 DIAGNOSIS — Z01812 Encounter for preprocedural laboratory examination: Secondary | ICD-10-CM | POA: Diagnosis not present

## 2020-01-18 LAB — SARS CORONAVIRUS 2 (TAT 6-24 HRS): SARS Coronavirus 2: NEGATIVE

## 2020-01-20 ENCOUNTER — Ambulatory Visit (HOSPITAL_COMMUNITY)
Admission: RE | Admit: 2020-01-20 | Discharge: 2020-01-20 | Disposition: A | Discharge status: Home / Self Care | Payer: PPO | Attending: Gastroenterology | Admitting: Gastroenterology

## 2020-01-20 ENCOUNTER — Encounter (HOSPITAL_COMMUNITY): Payer: Self-pay | Admitting: Gastroenterology

## 2020-01-20 ENCOUNTER — Encounter (HOSPITAL_COMMUNITY)
Admission: RE | Disposition: A | Discharge status: Home / Self Care | Payer: Self-pay | Source: Home / Self Care | Attending: Gastroenterology

## 2020-01-20 ENCOUNTER — Other Ambulatory Visit: Payer: Self-pay

## 2020-01-20 DIAGNOSIS — D120 Benign neoplasm of cecum: Secondary | ICD-10-CM | POA: Diagnosis not present

## 2020-01-20 DIAGNOSIS — Z8249 Family history of ischemic heart disease and other diseases of the circulatory system: Secondary | ICD-10-CM | POA: Diagnosis not present

## 2020-01-20 DIAGNOSIS — Z87891 Personal history of nicotine dependence: Secondary | ICD-10-CM | POA: Diagnosis not present

## 2020-01-20 DIAGNOSIS — D123 Benign neoplasm of transverse colon: Secondary | ICD-10-CM | POA: Insufficient documentation

## 2020-01-20 DIAGNOSIS — D122 Benign neoplasm of ascending colon: Secondary | ICD-10-CM | POA: Diagnosis not present

## 2020-01-20 DIAGNOSIS — E1151 Type 2 diabetes mellitus with diabetic peripheral angiopathy without gangrene: Secondary | ICD-10-CM | POA: Insufficient documentation

## 2020-01-20 DIAGNOSIS — E785 Hyperlipidemia, unspecified: Secondary | ICD-10-CM | POA: Insufficient documentation

## 2020-01-20 DIAGNOSIS — R195 Other fecal abnormalities: Secondary | ICD-10-CM | POA: Insufficient documentation

## 2020-01-20 DIAGNOSIS — K648 Other hemorrhoids: Secondary | ICD-10-CM | POA: Insufficient documentation

## 2020-01-20 DIAGNOSIS — Z7982 Long term (current) use of aspirin: Secondary | ICD-10-CM | POA: Insufficient documentation

## 2020-01-20 DIAGNOSIS — I110 Hypertensive heart disease with heart failure: Secondary | ICD-10-CM | POA: Insufficient documentation

## 2020-01-20 DIAGNOSIS — N529 Male erectile dysfunction, unspecified: Secondary | ICD-10-CM | POA: Diagnosis not present

## 2020-01-20 DIAGNOSIS — I509 Heart failure, unspecified: Secondary | ICD-10-CM | POA: Diagnosis not present

## 2020-01-20 DIAGNOSIS — K635 Polyp of colon: Secondary | ICD-10-CM

## 2020-01-20 DIAGNOSIS — K644 Residual hemorrhoidal skin tags: Secondary | ICD-10-CM | POA: Insufficient documentation

## 2020-01-20 DIAGNOSIS — Q438 Other specified congenital malformations of intestine: Secondary | ICD-10-CM | POA: Insufficient documentation

## 2020-01-20 DIAGNOSIS — Z794 Long term (current) use of insulin: Secondary | ICD-10-CM | POA: Diagnosis not present

## 2020-01-20 DIAGNOSIS — Z86010 Personal history of colonic polyps: Secondary | ICD-10-CM

## 2020-01-20 DIAGNOSIS — Z79899 Other long term (current) drug therapy: Secondary | ICD-10-CM | POA: Insufficient documentation

## 2020-01-20 HISTORY — PX: COLONOSCOPY: SHX5424

## 2020-01-20 HISTORY — PX: POLYPECTOMY: SHX5525

## 2020-01-20 LAB — GLUCOSE, CAPILLARY: Glucose-Capillary: 105 mg/dL — ABNORMAL HIGH (ref 70–99)

## 2020-01-20 SURGERY — COLONOSCOPY
Anesthesia: Moderate Sedation

## 2020-01-20 MED ORDER — MIDAZOLAM HCL 5 MG/5ML IJ SOLN
INTRAMUSCULAR | Status: AC
  Filled 2020-01-20: qty 10

## 2020-01-20 MED ORDER — MEPERIDINE HCL 100 MG/ML IJ SOLN
INTRAMUSCULAR | Status: DC | PRN
  Administered 2020-01-20 (×2): 25 mg via INTRAVENOUS

## 2020-01-20 MED ORDER — SODIUM CHLORIDE 0.9 % IV SOLN: INTRAVENOUS | Status: DC

## 2020-01-20 MED ORDER — MIDAZOLAM HCL 5 MG/5ML IJ SOLN
INTRAMUSCULAR | Status: DC | PRN
  Administered 2020-01-20: 2 mg via INTRAVENOUS
  Administered 2020-01-20: 1 mg via INTRAVENOUS
  Administered 2020-01-20: 2 mg via INTRAVENOUS

## 2020-01-20 MED ORDER — MEPERIDINE HCL 100 MG/ML IJ SOLN
INTRAMUSCULAR | Status: AC
  Filled 2020-01-20: qty 2

## 2020-01-20 MED ORDER — STERILE WATER FOR IRRIGATION IR SOLN
Status: DC | PRN
  Administered 2020-01-20: 1.5 mL

## 2020-01-20 MED ORDER — SODIUM CHLORIDE (PF) 0.9 % IJ SOLN
PREFILLED_SYRINGE | INTRAMUSCULAR | Status: DC | PRN
  Administered 2020-01-20: 1 mL

## 2020-01-20 MED ORDER — EPINEPHRINE 1 MG/10ML IJ SOSY
PREFILLED_SYRINGE | INTRAMUSCULAR | Status: AC
  Filled 2020-01-20: qty 10

## 2020-01-20 NOTE — Op Note (Signed)
[facility] Patient Name: Derek Mckenzie Procedure Date: 01/20/2020 8:11 AM MRN: [id number] Date of Birth: [birth year] Attending MD: Barney Drain MD, MD CSN: [id number] Age: 64 Admit Type: Outpatient Procedure:                Colonoscopy WITH COLD SNARE POLYPECTOMY/SUBMUCOSAL                            INJECTION Indications:              Heme positive stool Providers:                Barney Drain MD, MD, Otis Peak B. Sharon Seller, RN, Nelma Rothman, Technician Referring MD:             Glenda Chroman Medicines:                Meperidine 50 mg IV, Midazolam 5 mg IV Complications:            No immediate complications. Estimated Blood Loss:     Estimated blood loss was minimal. Procedure:                Pre-Anesthesia Assessment:                           - Prior to the procedure, a History and Physical                            was performed, and patient medications and                            allergies were reviewed. The patient's tolerance of                            previous anesthesia was also reviewed. The risks                            and benefits of the procedure and the sedation                            options and risks were discussed with the patient.                            All questions were answered, and informed consent                            was obtained. Prior Anticoagulants: The patient has                            taken no previous anticoagulant or antiplatelet                            agents except for aspirin. ASA Grade Assessment: II                            -  A patient with mild systemic disease. After                            reviewing the risks and benefits, the patient was                            deemed in satisfactory condition to undergo the                            procedure. After obtaining informed consent, the                            colonoscope was passed under direct vision.                             Throughout the procedure, the patient's blood                            pressure, pulse, and oxygen saturations were                            monitored continuously. The [id number]                            scope was introduced through the anus and advanced                            to the the cecum, identified by appendiceal orifice                            and ileocecal valve. The colonoscopy was                            technically difficult and complex due to a                            redundant colon and significant looping. Successful                            completion of the procedure was aided by                            straightening and shortening the scope to obtain                            bowel loop reduction and COLOWRAP. The patient                            tolerated the procedure well. The quality of the                            bowel preparation was good. The ileocecal valve,  appendiceal orifice, and rectum were photographed. Scope In: 8:52:11 AM Scope Out: 9:35:53 AM Scope Withdrawal Time: 0 hours 37 minutes 49 seconds  Total Procedure Duration: 0 hours 43 minutes 42 seconds  Findings:      Seven sessile polyps were found in the sigmoid colon, mid transverse       colon, ascending colon(3) and cecum(2). The polyps were 3 to 6 mm in       size. These polyps were removed with a cold snare. Resection and       retrieval were complete.      Two pedunculated polyps were found in the mid transverse colon. The       polyps were 8 to 12 mm in size. Area was successfully injected with 2 mL       of a 1:10,000 solution of epinephrine for drug delivery. These polyps       were removed with a hot snare. Resection and retrieval were complete. To       prevent bleeding after the polypectomy, one hemostatic clip was       successfully placed (MR conditional). There was no bleeding at the end       of the procedure.       External and internal hemorrhoids were found. The hemorrhoids were       moderate.      The recto-sigmoid colon, sigmoid colon, descending colon and splenic       flexure were moderately tortuous. Impression:               - Seven 3 to 6 mm polyps in the sigmoid colon, in                            the mid transverse colon, in the ascending colon                            and in the cecum, removed with a cold snare.                            Resected and retrieved.                           - Two 8 to 12 mm polyps in the mid TRANSVERSE                            colon, removed with a hot snare. Resected and                            retrieved. Injected. Clip (MR conditional) was                            placed.                           - External and internal hemorrhoids.                           - Tortuous colon. Moderate Sedation:      Moderate (conscious) sedation was administered by the endoscopy nurse       and supervised  by the endoscopist. The following parameters were       monitored: oxygen saturation, heart rate, blood pressure, and response       to care. Total physician intraservice time was 53 minutes. Recommendation:           - Patient has a contact number available for                            emergencies. The signs and symptoms of potential                            delayed complications were discussed with the                            patient. Return to normal activities tomorrow.                            Written discharge instructions were provided to the                            patient.                           - High fiber diet.                           - Continue present medications.                           - Await pathology results.                           - Repeat colonoscopy in 3 years for surveillance. Procedure Code(s):        --- Professional ---                           [phone], Colonoscopy, flexible; with removal of                             tumor(s), polyp(s), or other lesion(s) by snare                            technique                           45381, Colonoscopy, flexible; with directed                            submucosal injection(s), any substance                           99153, Moderate sedation; each additional 15                            minutes intraservice time                           99153,  Moderate sedation; each additional 15                            minutes intraservice time                           99153, Moderate sedation; each additional 15                            minutes intraservice time                           G0500, Moderate sedation services provided by the                            same physician or other qualified health care                            professional performing a gastrointestinal                            endoscopic service that sedation supports,                            requiring the presence of an independent trained                            observer to assist in the monitoring of the                            patient's level of consciousness and physiological                            status; initial 15 minutes of intra-service time;                            patient age 34 years or older (additional time may                            be reported with [phone], as appropriate) Diagnosis Code(s):        --- Professional ---                           K63.5, Polyp of colon                           K64.8, Other hemorrhoids                           R19.5, Other fecal abnormalities                           Q43.8, Other specified congenital malformations of                            intestine CPT copyright 2019 American Medical Association. All  rights reserved. The codes documented in this report are preliminary and upon coder review may  be revised to meet current compliance requirements. Barney Drain, MD Barney Drain MD, MD 01/20/2020 10:15:11  AM This report has been signed electronically. Number of Addenda: 0

## 2020-01-20 NOTE — H&P (Signed)
Primary Care Physician:  Glenda Chroman, MD Primary Gastroenterologist:  Dr. Oneida Alar  Pre-Procedure History & Physical: HPI:  Derek Mckenzie is a 64 y.o. male here for [place].  Past Medical History:  Diagnosis Date  . CHF (congestive heart failure) ([place])   . Deafness in left ear   . ED (erectile dysfunction)   . Hearing loss in right ear   . Hyperlipidemia   . Hypertension   . PAD (peripheral artery disease) ([place])   . Type II diabetes mellitus ([place])     Past Surgical History:  Procedure Laterality Date  . CATARACT EXTRACTION W/PHACO Left 03/11/2016   Procedure: CATARACT EXTRACTION PHACO AND INTRAOCULAR LENS PLACEMENT (IOC);  Surgeon: Rutherford Guys, MD;  Location: [facility];  Service: Ophthalmology;  Laterality: Left;  CDE:2.71  . CATARACT EXTRACTION W/PHACO Right 05/20/2016   Procedure: CATARACT EXTRACTION PHACO AND INTRAOCULAR LENS PLACEMENT (IOC);  Surgeon: Rutherford Guys, MD;  Location: [facility];  Service: Ophthalmology;  Laterality: Right;  CDE: 4.33  . RETINAL LASER PROCEDURE    . VASECTOMY    . YAG LASER APPLICATION Right [id number]   Procedure: YAG LASER APPLICATION;  Surgeon: Rutherford Guys, MD;  Location: [facility];  Service: Ophthalmology;  Laterality: Right;    Prior to Admission medications   Medication Sig Start Date End Date Taking? Authorizing Provider  Alpha-Lipoic Acid 200 MG CAPS Take 200 mg by mouth daily.    Yes [provider]  amLODipine (NORVASC) 10 MG tablet TAKE 1 TABLET BY MOUTH ONCE DAILY Patient taking differently: Take 10 mg by mouth daily.  01/05/18  Yes Larey Dresser, MD  atorvastatin (LIPITOR) 80 MG tablet TAKE 1 TABLET ONCE DAILY. Patient taking differently: Take 80 mg by mouth daily.  02/21/19  Yes Larey Dresser, MD  carvedilol (COREG) 25 MG tablet Take 25 mg by mouth 2 (two) times daily with a meal.   Yes [provider]  ENTRESTO 49-51 MG TAKE (1) TABLET TWICE DAILY. Patient taking differently: Take 1 tablet by mouth 2  (two) times daily.  12/02/19  Yes Larey Dresser, MD  fenofibrate (TRICOR) 145 MG tablet TAKE 1 TABLET ONCE DAILY. Patient taking differently: Take 145 mg by mouth daily.  02/21/19  Yes Larey Dresser, MD  folic acid (FOLVITE) A999333 MCG tablet Take 400 mcg by mouth daily.    Yes [provider]  insulin NPH-regular Human (NOVOLIN 70/30) (70-30) 100 UNIT/ML injection Inject 14 Units into the skin 2 (two) times daily with a meal. 09/27/19  Yes Shamleffer, Melanie Crazier, MD  isosorbide-hydrALAZINE ([name]) 20-37.5 MG tablet Take 2 tablets by mouth 3 (three) times daily. 10/04/19  Yes Larey Dresser, MD  latanoprost (XALATAN) 0.005 % ophthalmic solution Place 1 drop into both eyes at bedtime.   Yes [provider]  polyethylene glycol-electrolytes (TRILYTE) 420 g solution Take 4,000 mLs by mouth as directed. 10/12/19  Yes Iline Buchinger, Marga Melnick, MD  vitamin B-12 (CYANOCOBALAMIN) 1000 MCG tablet Take 1,000 mcg by mouth daily.    Yes [provider]  aspirin EC 81 MG tablet Take 1 tablet (81 mg total) by mouth daily. Patient not taking: Reported on 01/16/2020 12/12/16   Larey Dresser, MD    Allergies as of 10/12/2019 - Review Complete 10/12/2019  Allergen Reaction Noted  . Atenolol Shortness Of Breath 10/31/2015  . Amlodipine besy-benazepril hcl Other (See Comments) 10/31/2015    Family History  Problem Relation Age of Onset  . Hypertension Mother   .  Stroke Mother   . Heart disease Father   . Other Neg Hx        gynecomastia  . Colon cancer Neg Hx   . Colon polyps Neg Hx     Social History   Socioeconomic History  . Marital status: Single    Spouse name: Not on file  . Number of children: Not on file  . Years of education: Not on file  . Highest education level: Not on file  Occupational History  . Not on file  Tobacco Use  . Smoking status: Former Smoker    Packs/day: 0.50    Years: 35.00    Pack years: 17.50    Types: Cigarettes  . Smokeless tobacco:  Never Used  . Tobacco comment: "quit smoking in ~ 2007"  Substance and Sexual Activity  . Alcohol use: No  . Drug use: No  . Sexual activity: Never  Other Topics Concern  . Not on file  Social History Narrative   IN A LONG TERM PLATONIC RELATIONSHIP. BEEN GOOD FRIENDS FOR YEARS AND TAKING CARE OF EACH OTHER. SEES KIDS VERY RARELY. 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 HEARING AID DID NOT WORK.   Social Determinants of Health   Financial Resource Strain:   . Difficulty of Paying Living Expenses: Not on file  Food Insecurity:   . Worried About Charity fundraiser in the Last Year: Not on file  . Ran Out of Food in the Last Year: Not on file  Transportation Needs:   . Lack of Transportation (Medical): Not on file  . Lack of Transportation (Non-Medical): Not on file  Physical Activity:   . Days of Exercise per Week: Not on file  . Minutes of Exercise per Session: Not on file  Stress:   . Feeling of Stress : Not on file  Social Connections:   . Frequency of Communication with Friends and Family: Not on file  . Frequency of Social Gatherings with Friends and Family: Not on file  . Attends Religious Services: Not on file  . Active Member of Clubs or Organizations: Not on file  . Attends Archivist Meetings: Not on file  . Marital Status: Not on file  Intimate Partner Violence:   . Fear of Current or Ex-Partner: Not on file  . Emotionally Abused: Not on file  . Physically Abused: Not on file  . Sexually Abused: Not on file    Review of Systems: See HPI, otherwise negative ROS   Physical Exam: BP (!) 192/89   Pulse 95   Temp 98.1 F (36.7 C) (Oral)   Resp 15   SpO2 100%  General:   Alert,  pleasant and cooperative in NAD Head:  Normocephalic and atraumatic. Neck:  Supple; Lungs:  Clear throughout to auscultation.    Heart:  Regular rate and rhythm. Abdomen:  Soft, nontender and nondistended. Normal bowel sounds, without guarding, and  without rebound.   Neurologic:  Alert and  oriented x4;  grossly normal neurologically.  Impression/Plan:     HEME POS STOOLS  PLAN:  1.TCS TODAY. DISCUSSED PROCEDURE, BENEFITS, & RISKS: < 1% chance of medication reaction, bleeding, perforation, ASPIRATION, or rupture of spleen/liver requiring surgery to fix it and missed polyps < 1 cm 10-20% of the time.

## 2020-01-20 NOTE — Discharge Instructions (Signed)
You had 9 polyps removed. You have MODERATE internal and external hemorrhoids.   IF YOU NEED AN MRI, LET THE RADIOLOGY TECH KNOW THAT YOU HAD ONE CLIP PLACED IN YOUR COLON. THEY SHOULD FALL OFF IN 30 DAYS.  DRINK WATER TO KEEP YOUR URINE LIGHT YELLOW.  FOLLOW A HIGH FIBER DIET. AVOID ITEMS THAT CAUSE BLOATING & GAS. SEE INFO BELOW.   USE PREPARATION H FOUR TIMES  A DAY IF NEEDED TO RELIEVE RECTAL PAIN/PRESSURE/BLEEDING.   YOUR BIOPSY RESULTS WILL BE BACK IN 5 BUSINESS DAYS.  Next colonoscopy in 3 years.    Colonoscopy Care After Read the instructions outlined below and refer to this sheet in the next week. These discharge instructions provide you with general information on caring for yourself after you leave the hospital. While your treatment has been planned according to the most current medical practices available, unavoidable complications occasionally occur. If you have any problems or questions after discharge, call DR. July Nickson, [phone].  ACTIVITY  You may resume your regular activity, but move at a slower pace for the next 24 hours.   Take frequent rest periods for the next 24 hours.   Walking will help get rid of the air and reduce the bloated feeling in your belly (abdomen).   No driving for 24 hours (because of the medicine (anesthesia) used during the test).   You may shower.   Do not sign any important legal documents or operate any machinery for 24 hours (because of the anesthesia used during the test).    NUTRITION  Drink plenty of fluids.   You may resume your normal diet as instructed by your doctor.   Begin with a light meal and progress to your normal diet. Heavy or fried foods are harder to digest and may make you feel sick to your stomach (nauseated).   Avoid alcoholic beverages for 24 hours or as instructed.    MEDICATIONS  You may resume your normal medications.   WHAT YOU CAN EXPECT TODAY  Some feelings of bloating in the abdomen.    Passage of more gas than usual.   Spotting of blood in your stool or on the toilet paper  .  IF YOU HAD POLYPS REMOVED DURING THE COLONOSCOPY:  Eat a soft diet IF YOU HAVE NAUSEA, BLOATING, ABDOMINAL PAIN, OR VOMITING.    FINDING OUT THE RESULTS OF YOUR TEST Not all test results are available during your visit. DR. Oneida Alar WILL CALL YOU WITHIN 14 DAYS OF YOUR PROCEDUE WITH YOUR RESULTS. Do not assume everything is normal if you have not heard from DR. Silvia Hightower, CALL HER OFFICE AT [phone].  SEEK IMMEDIATE MEDICAL ATTENTION AND CALL THE OFFICE: [phone] IF:  You have more than a spotting of blood in your stool.   Your belly is swollen (abdominal distention).   You are nauseated or vomiting.   You have a temperature over 101F.   You have abdominal pain or discomfort that is severe or gets worse throughout the day.   High-Fiber Diet A high-fiber diet changes your normal diet to include more whole grains, legumes, fruits, and vegetables. Changes in the diet involve replacing refined carbohydrates with unrefined foods. The calorie level of the diet is essentially unchanged. The Dietary Reference Intake (recommended amount) for adult males is 38 grams per day. For adult females, it is 25 grams per day. Pregnant and lactating women should consume 28 grams of fiber per day. Fiber is the intact part of a plant that is not  broken down during digestion. Functional fiber is fiber that has been isolated from the plant to provide a beneficial effect in the body.  PURPOSE  Increase stool bulk.   Ease and regulate bowel movements.   Lower cholesterol.   REDUCE RISK OF COLON CANCER  INDICATIONS THAT YOU NEED MORE FIBER  Constipation and hemorrhoids.   Uncomplicated diverticulosis (intestine condition) and irritable bowel syndrome.   Weight management.   As a protective measure against hardening of the arteries (atherosclerosis), diabetes, and cancer.   GUIDELINES FOR  INCREASING FIBER IN THE DIET  Start adding fiber to the diet slowly. A gradual increase of about 5 more grams (2 servings of most fruits or vegetables) per day is best. Too rapid an increase in fiber may result in constipation, flatulence, and bloating.   Drink enough water and fluids to keep your urine clear or pale yellow. Water, juice, or caffeine-free drinks are recommended. Not drinking enough fluid may cause constipation.   Eat a variety of high-fiber foods rather than one type of fiber.   Try to increase your intake of fiber through using high-fiber foods rather than fiber pills or supplements that contain small amounts of fiber.   The goal is to change the types of food eaten. Do not supplement your present diet with high-fiber foods, but replace foods in your present diet.    Polyps, Colon  A polyp is extra tissue that grows inside your body. Colon polyps grow in the large intestine. The large intestine, also called the colon, is part of your digestive system. It is a long, hollow tube at the end of your digestive tract where your body makes and stores stool. Most polyps are not dangerous. They are benign. This means they are not cancerous. But over time, some types of polyps can turn into cancer. Polyps that are smaller than a pea are usually not harmful. But larger polyps could someday become or may already be cancerous. To be safe, doctors remove all polyps and test them.   WHO GETS POLYPS? Anyone can get polyps, but certain people are more likely than others. You may have a greater chance of getting polyps if:  You are over 50.   You have had polyps before.   Someone in your family has had polyps.   Someone in your family has had cancer of the large intestine.   Find out if someone in your family has had polyps. You may also be more likely to get polyps if you:   Eat a lot of fatty foods   Smoke   Drink alcohol   Do not exercise  Eat too much   PREVENTION There is not  one sure way to prevent polyps. You might be able to lower your risk of getting them if you:  Eat more fruits and vegetables and less fatty food.   Do not smoke.   Avoid alcohol.   Exercise every day.   Lose weight if you are overweight.   Eating more calcium and folate can also lower your risk of getting polyps. Some foods that are rich in calcium are milk, cheese, and broccoli. Some foods that are rich in folate are chickpeas, kidney beans, and spinach.

## 2020-01-23 ENCOUNTER — Other Ambulatory Visit (HOSPITAL_COMMUNITY): Payer: Self-pay | Admitting: Cardiology

## 2020-01-23 LAB — SURGICAL PATHOLOGY

## 2020-01-27 ENCOUNTER — Other Ambulatory Visit: Payer: Self-pay

## 2020-01-31 ENCOUNTER — Other Ambulatory Visit: Payer: Self-pay

## 2020-01-31 ENCOUNTER — Ambulatory Visit: Payer: PPO | Admitting: Internal Medicine

## 2020-01-31 ENCOUNTER — Encounter: Payer: Self-pay | Admitting: Internal Medicine

## 2020-01-31 VITALS — BP 126/72 | HR 82 | Temp 98.1°F | Ht 65.0 in | Wt 183.0 lb

## 2020-01-31 DIAGNOSIS — E1121 Type 2 diabetes mellitus with diabetic nephropathy: Secondary | ICD-10-CM

## 2020-01-31 DIAGNOSIS — E118 Type 2 diabetes mellitus with unspecified complications: Secondary | ICD-10-CM

## 2020-01-31 DIAGNOSIS — Z794 Long term (current) use of insulin: Secondary | ICD-10-CM

## 2020-01-31 DIAGNOSIS — E1142 Type 2 diabetes mellitus with diabetic polyneuropathy: Secondary | ICD-10-CM

## 2020-01-31 DIAGNOSIS — N1832 Chronic kidney disease, stage 3b: Secondary | ICD-10-CM | POA: Diagnosis not present

## 2020-01-31 LAB — POCT GLYCOSYLATED HEMOGLOBIN (HGB A1C): Hemoglobin A1C: 6.8 % — AB (ref 4.0–5.6)

## 2020-01-31 MED ORDER — FREESTYLE LIBRE 14 DAY SENSOR MISC: 1.0000 | 11 refills | Status: DC

## 2020-01-31 MED ORDER — FREESTYLE LIBRE 14 DAY READER DEVI: 1.0000 | 0 refills | Status: DC

## 2020-01-31 NOTE — Progress Notes (Signed)
Name: Derek Mckenzie  Age/ Sex: 64 y.o., male   MRN/ DOB: [id number], 1956/08/08     PCP: Glenda Chroman, MD   Reason for Endocrinology Evaluation: Type 2 Diabetes Mellitus  Initial Endocrine Consultative Visit: 05/16/2019    PATIENT IDENTIFIER: Derek Mckenzie is a 64 y.o. male with a past medical history of HTN, T2DM, cardiomyopathy and dyslipidemia. The patient has followed with Endocrinology clinic since 05/16/2019 for consultative assistance with management of his diabetes.  DIABETIC HISTORY:  Derek Mckenzie was diagnosed with T2DM > 20 yrs ago. He does not recall prior oral glycemic agents, he has been on insulin for years.  His hemoglobin A1c has ranged from 12.2% in 2016, peaking at 13.1% in  2019   On his initial visit to our clinic, his A1c was 12.2% and he was on Novolin- N only. We started him on Novolin- Mix   SUBJECTIVE:   During the last visit (09/27/2019): A1c 6.5 % . Decreased  novolin- Mix (70/30)   Today (01/31/2020): Derek Mckenzie is here for a 3 month follow up on diabetes management, he is accompanied by his roommate Derek Mckenzie.  He checks his blood sugars 2 times daily, preprandial to breakfast and supper. The patient has had hypoglycemic episodes since the last clinic visit, which occurred 1x /month and usually during the day .The patient is  symptomatic with these episodes. Otherwise, the patient has not required any recent emergency interventions for hypoglycemia and has not had recent hospitalizations secondary to hyper or hypoglycemic episodes.    ROS: As per HPI and as detailed below: Review of Systems  Constitutional: Negative for fever and weight loss.  HENT: Negative for congestion and sore throat.   Respiratory: Negative for cough and shortness of breath.   Cardiovascular: Negative for chest pain and palpitations.  Gastrointestinal: Negative for diarrhea and nausea.      HOME DIABETES REGIMEN:  Novolin Mix (70/30) 14 units BID with Breakfast and  supper      METER DOWNLOAD SUMMARY:  1/20-01/31/2020 Fingerstick Blood Glucose Tests = 27 Average Number Tests/Day = 1.9 Overall Mean FS Glucose = 140   BG Ranges: Low = 64 High = 279  Hypoglycemic Events/30 Days: BG < 50 = 0 Episodes of symptomatic severe hypoglycemia = 0   HISTORY:  Past Medical History:  Past Medical History:  Diagnosis Date  . CHF (congestive heart failure) ([place])   . Deafness in left ear   . ED (erectile dysfunction)   . Hearing loss in right ear   . Hyperlipidemia   . Hypertension   . PAD (peripheral artery disease) ([place])   . Type II diabetes mellitus ([place])    Past Surgical History:  Past Surgical History:  Procedure Laterality Date  . CATARACT EXTRACTION W/PHACO Left 03/11/2016   Procedure: CATARACT EXTRACTION PHACO AND INTRAOCULAR LENS PLACEMENT (IOC);  Surgeon: Rutherford Guys, MD;  Location: [facility];  Service: Ophthalmology;  Laterality: Left;  CDE:2.71  . CATARACT EXTRACTION W/PHACO Right 05/20/2016   Procedure: CATARACT EXTRACTION PHACO AND INTRAOCULAR LENS PLACEMENT (IOC);  Surgeon: Rutherford Guys, MD;  Location: [facility];  Service: Ophthalmology;  Laterality: Right;  CDE: 4.33  . COLONOSCOPY N/A 01/20/2020   Procedure: COLONOSCOPY;  Surgeon: Danie Binder, MD;  Location[facility];  Service: Endoscopy;  Laterality: N/A;  8:30am  . POLYPECTOMY  01/20/2020   Procedure: POLYPECTOMY;  Surgeon: Danie Binder, MD;  [facility];  Service: Endoscopy;;  . RETINAL LASER PROCEDURE    .  VASECTOMY    . YAG LASER APPLICATION Right [id number]   Procedure: YAG LASER APPLICATION;  Surgeon: Rutherford Guys, MD;  Location: [facility];  Service: Ophthalmology;  Laterality: Right;    Social History:  reports that he has quit smoking. His smoking use included cigarettes. He has a 17.50 pack-year smoking history. He has never used smokeless tobacco. He reports that he does not drink alcohol or use drugs. Family History:  Family History  Problem Relation Age of  Onset  . Hypertension Mother   . Stroke Mother   . Heart disease Father   . Other Neg Hx        gynecomastia  . Colon cancer Neg Hx   . Colon polyps Neg Hx      HOME MEDICATIONS: Allergies as of 01/31/2020      Reactions   Atenolol Shortness Of Breath   Amlodipine Besy-benazepril Hcl Other (See Comments)   Headaches      Medication List       Accurate as of January 31, 2020 10:02 AM. If you have any questions, ask your nurse or doctor.        Alpha-Lipoic Acid 200 MG Caps Take 200 mg by mouth daily.   amLODipine 10 MG tablet Commonly known as: NORVASC TAKE 1 TABLET BY MOUTH ONCE DAILY   aspirin EC 81 MG tablet Take 1 tablet (81 mg total) by mouth daily.   atorvastatin 80 MG tablet Commonly known as: LIPITOR TAKE 1 TABLET ONCE DAILY.   [name] 20-37.5 MG tablet Generic drug: isosorbide-hydrALAZINE Take 2 tablets by mouth 3 (three) times daily.   carvedilol 25 MG tablet Commonly known as: COREG Take 25 mg by mouth 2 (two) times daily with a meal.   Entresto 49-51 MG Generic drug: sacubitril-valsartan TAKE (1) TABLET TWICE DAILY. What changed: See the new instructions.   fenofibrate 145 MG tablet Commonly known as: TRICOR TAKE 1 TABLET ONCE DAILY.   folic acid A999333 MCG tablet Commonly known as: FOLVITE Take 400 mcg by mouth daily.   FreeStyle Libre 14 Day Reader Kerrin Mo 1 Device by Does not apply route as directed. Started by: [name], MD   FreeStyle Libre 14 Day Sensor Misc 1 Device by Does not apply route as directed. Started by: [name], MD   latanoprost 0.005 % ophthalmic solution Commonly known as: XALATAN Place 1 drop into both eyes at bedtime.   NovoLIN 70/30 (70-30) 100 UNIT/ML injection Generic drug: insulin NPH-regular Human Inject 14 Units into the skin 2 (two) times daily with a meal.   vitamin B-12 1000 MCG tablet Commonly known as: CYANOCOBALAMIN Take 1,000 mcg by mouth daily.        OBJECTIVE:   Vital  Signs: BP 126/72 (BP Location: Left Arm, Patient Position: Sitting, Cuff Size: Large)   Pulse 82   Temp 98.1 F (36.7 C)   Ht 5\' 5"  (1.651 m)   Wt 183 lb (83 kg)   SpO2 98%   BMI 30.45 kg/m   Wt Readings from Last 3 Encounters:  01/31/20 183 lb (83 kg)  10/12/19 181 lb 6.4 oz (82.3 kg)  10/03/19 181 lb 12.8 oz (82.5 kg)     Exam: General: Pt appears well and is in NAD  Lungs: Clear with good BS bilat with no rales, rhonchi, or wheezes  Heart: RRR with normal S1 and S2 and no gallops; no murmurs; no rub  Extremities: Trace pretibial edema. No tremor.   Skin: Normal texture and temperature  to palpation. No rash noted.  Neuro: MS is good with appropriate affect, pt is alert and Ox3     DM foot exam: 01/31/2020 The skin of the feet is intact without sores or ulcerations.Bilateral planter callous formation noted and thickened, discolored toe nails The pedal pulses are undetectable  The sensation is decreased  to a screening 5.07, 10 gram monofilament bilaterally            DATA REVIEWED:  Lab Results  Component Value Date   HGBA1C 6.8 (A) 01/31/2020   HGBA1C 6.5 (A) 09/27/2019   HGBA1C 12.2 (A) 05/16/2019   Lab Results  Component Value Date   LDLCALC 70 10/03/2019   CREATININE 1.91 (H) 10/03/2019    Lab Results  Component Value Date   CHOL 112 10/03/2019   HDL 26 (L) 10/03/2019   LDLCALC 70 10/03/2019   TRIG 78 10/03/2019   CHOLHDL 4.3 10/03/2019       ASSESSMENT / PLAN / RECOMMENDATIONS:   1) Type 2 Diabetes Mellitus, Optimally controlled, With CKD III complications and PVD - Most recent A1c of 6.8 %. Goal A1c < 7.0 %.    - He continues to do very well with his diet and insulin regimen - He did have rare hypoglycemic episodes during the day, these are due to inconsistent eating habits. The pt advised to eat 3 consistent meals per day while on this insulin regimen.  - He is interested in CGM, a prescription of freestyle libre was sent to his pharmacy, pt  advised to contact us once he obtains the CGM for education with Ms. Leonia Reader   MEDICATIONS: - Novolin Mix (70/30) 14 units with Breakfast and with supper    EDUCATION / INSTRUCTIONS:  BG monitoring instructions: Patient is instructed to check his blood sugars 2 times a day, before breakfast and supper.  Call [facility] if: BG persistently < 70 or > 300. . I reviewed the Rule of 15 for the treatment of hypoglycemia in detail with the patient. Literature supplied.    F/U  6 months     Signed electronically by: Mack Guise, MD  [facility] Endocrinology  [facility] [address] [place], [place] [address] Phone: [phone] FAX: [phone]   CC: Glenda Chroman, MD [place] [address] Phone: [phone]  Fax: [phone]  Return to Endocrinology clinic as below: Future Appointments  Date Time Provider [place]  07/30/2020  9:30 AM Shamleffer, Melanie Crazier, MD [facility] None

## 2020-01-31 NOTE — Patient Instructions (Signed)
-   Novolin -Mix  14 units with Breakfast and 14 units with Supper      - HOW TO TREAT LOW BLOOD SUGARS (Blood sugar LESS THAN 70 MG/DL)  Please follow the RULE OF 15 for the treatment of hypoglycemia treatment (when your (blood sugars are less than 70 mg/dL)    STEP 1: Take 15 grams of carbohydrates when your blood sugar is low, which includes:   3-4 GLUCOSE TABS  OR  3-4 OZ OF JUICE OR REGULAR SODA OR  ONE TUBE OF GLUCOSE GEL     STEP 2: RECHECK blood sugar in 15 MINUTES STEP 3: If your blood sugar is still low at the 15 minute recheck --> then, go back to STEP 1 and treat AGAIN with another 15 grams of carbohydrates.

## 2020-02-13 ENCOUNTER — Telehealth: Payer: Self-pay | Admitting: Nutrition

## 2020-02-13 ENCOUNTER — Other Ambulatory Visit: Payer: Self-pay | Admitting: Internal Medicine

## 2020-02-13 DIAGNOSIS — E118 Type 2 diabetes mellitus with unspecified complications: Secondary | ICD-10-CM

## 2020-02-13 DIAGNOSIS — Z794 Long term (current) use of insulin: Secondary | ICD-10-CM

## 2020-02-13 NOTE — Progress Notes (Signed)
ferral

## 2020-02-15 ENCOUNTER — Other Ambulatory Visit: Payer: Self-pay

## 2020-02-15 ENCOUNTER — Encounter: Payer: PPO | Attending: Internal Medicine | Admitting: Nutrition

## 2020-02-15 ENCOUNTER — Other Ambulatory Visit (HOSPITAL_COMMUNITY): Payer: Self-pay | Admitting: Cardiology

## 2020-02-15 DIAGNOSIS — Z794 Long term (current) use of insulin: Secondary | ICD-10-CM | POA: Insufficient documentation

## 2020-02-15 DIAGNOSIS — E118 Type 2 diabetes mellitus with unspecified complications: Secondary | ICD-10-CM | POA: Insufficient documentation

## 2020-02-16 NOTE — Progress Notes (Signed)
Patient is here with his wife to learn how to use the [place].  He was trained on how to use this and we discussed the difference between sensor glucose and blood glucose.  His phone was not able to accept readings, but his wife's was.  He agreed to use his wife's phone, and was told to scan at least every 8 hours, before bed, first morning, and at least one other time.  He inserted the sensor into his left abdomen without difficulty, and had no questions.  [place] Lathe was downloaded to his wife's phone, but she did not have email to her phone, and was not able to finish the download.  She will do this when she gets home, and was given a printout on how to link [place].  She is going to get her grandson to help her with this.  I will call her to see if this is completed on Monday

## 2020-02-16 NOTE — Patient Instructions (Signed)
Scan libre at least every 8 hours Replace sensor every 14 days. Call [place] help line if questions.

## 2020-02-22 NOTE — Telephone Encounter (Signed)
Appointment schedule for Wednesday for [place] start

## 2020-03-22 ENCOUNTER — Other Ambulatory Visit (HOSPITAL_COMMUNITY): Payer: Self-pay | Admitting: Cardiology

## 2020-04-05 DIAGNOSIS — H401132 Primary open-angle glaucoma, bilateral, moderate stage: Secondary | ICD-10-CM | POA: Diagnosis not present

## 2020-04-23 ENCOUNTER — Other Ambulatory Visit (HOSPITAL_COMMUNITY): Payer: Self-pay | Admitting: Cardiology

## 2020-05-18 ENCOUNTER — Other Ambulatory Visit (HOSPITAL_COMMUNITY): Payer: Self-pay | Admitting: Cardiology

## 2020-05-21 ENCOUNTER — Other Ambulatory Visit (HOSPITAL_COMMUNITY): Payer: Self-pay | Admitting: Cardiology

## 2020-07-11 DIAGNOSIS — E119 Type 2 diabetes mellitus without complications: Secondary | ICD-10-CM | POA: Diagnosis not present

## 2020-07-11 DIAGNOSIS — H524 Presbyopia: Secondary | ICD-10-CM | POA: Diagnosis not present

## 2020-07-11 DIAGNOSIS — Z794 Long term (current) use of insulin: Secondary | ICD-10-CM | POA: Diagnosis not present

## 2020-07-11 DIAGNOSIS — H401132 Primary open-angle glaucoma, bilateral, moderate stage: Secondary | ICD-10-CM | POA: Diagnosis not present

## 2020-07-11 DIAGNOSIS — Z961 Presence of intraocular lens: Secondary | ICD-10-CM | POA: Diagnosis not present

## 2020-07-11 LAB — HM DIABETES EYE EXAM

## 2020-07-17 ENCOUNTER — Other Ambulatory Visit (HOSPITAL_COMMUNITY): Payer: Self-pay | Admitting: Cardiology

## 2020-07-24 DIAGNOSIS — M79676 Pain in unspecified toe(s): Secondary | ICD-10-CM | POA: Diagnosis not present

## 2020-07-24 DIAGNOSIS — B351 Tinea unguium: Secondary | ICD-10-CM | POA: Diagnosis not present

## 2020-07-24 DIAGNOSIS — L84 Corns and callosities: Secondary | ICD-10-CM | POA: Diagnosis not present

## 2020-07-24 DIAGNOSIS — E1142 Type 2 diabetes mellitus with diabetic polyneuropathy: Secondary | ICD-10-CM | POA: Diagnosis not present

## 2020-07-30 ENCOUNTER — Ambulatory Visit: Payer: PPO | Admitting: Internal Medicine

## 2020-07-30 ENCOUNTER — Other Ambulatory Visit: Payer: Self-pay

## 2020-07-30 ENCOUNTER — Encounter: Payer: Self-pay | Admitting: Internal Medicine

## 2020-07-30 ENCOUNTER — Telehealth: Payer: Self-pay | Admitting: Internal Medicine

## 2020-07-30 VITALS — BP 138/78 | HR 88 | Ht 65.0 in | Wt 185.4 lb

## 2020-07-30 DIAGNOSIS — Z794 Long term (current) use of insulin: Secondary | ICD-10-CM | POA: Diagnosis not present

## 2020-07-30 DIAGNOSIS — E118 Type 2 diabetes mellitus with unspecified complications: Secondary | ICD-10-CM | POA: Diagnosis not present

## 2020-07-30 LAB — POCT GLYCOSYLATED HEMOGLOBIN (HGB A1C): Hemoglobin A1C: 7.9 % — AB (ref 4.0–5.6)

## 2020-07-30 LAB — POCT GLUCOSE (DEVICE FOR HOME USE): Glucose Fasting, POC: 197 mg/dL — AB (ref 70–99)

## 2020-07-30 MED ORDER — DEXCOM G6 SENSOR MISC: 1.0000 | 11 refills | Status: DC

## 2020-07-30 MED ORDER — NOVOLIN 70/30 (70-30) 100 UNIT/ML ~~LOC~~ SUSP
15.0000 [IU] | Freq: Two times a day (BID) | SUBCUTANEOUS | 6 refills | Status: DC
Start: 2020-07-30 — End: 2020-12-05

## 2020-07-30 MED ORDER — DEXCOM G6 RECEIVER DEVI: 1.0000 | 0 refills | Status: DC

## 2020-07-30 MED ORDER — DEXCOM G6 TRANSMITTER MISC: 1.0000 | 3 refills | Status: DC

## 2020-07-30 NOTE — Telephone Encounter (Signed)
Per [employer] does not cover the DEX Com. So patient is going to continue to use Free Style and is going to place it on his arm instead of his abdomen to see if he gets more accurate readings.

## 2020-07-30 NOTE — Telephone Encounter (Signed)
PA for dexcom started on covermymeds: Jaxon Baudoin Key: [id number] - PA Case ID: [id number] - Rx #: [id number]

## 2020-07-30 NOTE — Patient Instructions (Addendum)
-   Increase Novolin -Mix  15 units with Breakfast and 15  units with Supper      - HOW TO TREAT LOW BLOOD SUGARS (Blood sugar LESS THAN 70 MG/DL)  Please follow the RULE OF 15 for the treatment of hypoglycemia treatment (when your (blood sugars are less than 70 mg/dL)    STEP 1: Take 15 grams of carbohydrates when your blood sugar is low, which includes:   3-4 GLUCOSE TABS  OR  3-4 OZ OF JUICE OR REGULAR SODA OR  ONE TUBE OF GLUCOSE GEL     STEP 2: RECHECK blood sugar in 15 MINUTES STEP 3: If your blood sugar is still low at the 15 minute recheck --> then, go back to STEP 1 and treat AGAIN with another 15 grams of carbohydrates.

## 2020-07-30 NOTE — Progress Notes (Signed)
Name: Derek Mckenzie  Age/ Sex: 64 y.o., male   MRN/ DOB: [id number], 05-Jul-1956     PCP: Glenda Chroman, MD   Reason for Endocrinology Evaluation: Type 2 Diabetes Mellitus  Initial Endocrine Consultative Visit: 05/16/2019    PATIENT IDENTIFIER: Derek Mckenzie is a 64 y.o. male with a past medical history of HTN, T2DM, cardiomyopathy and dyslipidemia. The patient has followed with Endocrinology clinic since 05/16/2019 for consultative assistance with management of his diabetes.  DIABETIC HISTORY:  Mr. Main was diagnosed with T2DM > 20 yrs ago. He does not recall prior oral glycemic agents, he has been on insulin for years.  His hemoglobin A1c has ranged from 12.2% in 2016, peaking at 13.1% in  2019   On his initial visit to our clinic, his A1c was 12.2% and he was on Novolin- N only. We started him on Novolin- Mix   SUBJECTIVE:   During the last visit (01/31/2020): A1c 6.8 % . Continued  novolin- Mix (70/30)   Today (07/30/2020): Mr. [name] is here for a 6 month follow up on diabetes management, he is accompanied by his roommate Derek Mckenzie.  He checks his blood sugars 2 times daily, preprandial to breakfast and supper through the CGM. The patient has had hypoglycemic episodes since the last clinic visit, which occurred 3x /week and usually during the day .The patient is  symptomatic with a BG of 90 but not 50 mg/dL ??.     HOME DIABETES REGIMEN:  Novolin Mix (70/30) 14 units BID with Breakfast and supper       CONTINUOUS GLUCOSE MONITORING RECORD INTERPRETATION    Dates of Recording: 7/20-07/30/2020  Sensor description:Freestyle Libre  Results statistics:   CGM use % of time 63  Average and SD 127/29.2  Time in range     83   %  % Time Above 180 8  % Time above 250 0  % Time Below target 9       Glycemic patterns summary: hyperglycemia at dinner time, but BG's trend down to goal   Hyperglycemic episodes  Post summer   Hypoglycemic episodes occurred during  the day  Overnight periods: stable        DIABETIC COMPLICATIONS: Microvascular complications:   CKD III  Denies: neuropathy, retinopathy  Last eye exam: Completed 07/11/2020  Macrovascular complications:   CHF, PVD  Denies: CAD, CVA    HISTORY:  Past Medical History:  Past Medical History:  Diagnosis Date  . CHF (congestive heart failure) ([place])   . Deafness in left ear   . ED (erectile dysfunction)   . Hearing loss in right ear   . Hyperlipidemia   . Hypertension   . PAD (peripheral artery disease) ([place])   . Type II diabetes mellitus ([place])    Past Surgical History:  Past Surgical History:  Procedure Laterality Date  . CATARACT EXTRACTION W/PHACO Left 03/11/2016   Procedure: CATARACT EXTRACTION PHACO AND INTRAOCULAR LENS PLACEMENT (IOC);  Surgeon: Rutherford Guys, MD;  Location: [facility];  Service: Ophthalmology;  Laterality: Left;  CDE:2.71  . CATARACT EXTRACTION W/PHACO Right 05/20/2016   Procedure: CATARACT EXTRACTION PHACO AND INTRAOCULAR LENS PLACEMENT (IOC);  Surgeon: Rutherford Guys, MD;  Location: [facility];  Service: Ophthalmology;  Laterality: Right;  CDE: 4.33  . COLONOSCOPY N/A 01/20/2020   Procedure: COLONOSCOPY;  Surgeon: Danie Binder, MD;  [facility];  Service: Endoscopy;  Laterality: N/A;  8:30am  . POLYPECTOMY  01/20/2020   Procedure: POLYPECTOMY;  Surgeon:  Fields, Marga Melnick, MD;  [facility];  Service: Endoscopy;;  . RETINAL LASER PROCEDURE    . VASECTOMY    . YAG LASER APPLICATION Right 6/57/8469   Procedure: YAG LASER APPLICATION;  Surgeon: Rutherford Guys, MD;  Location: [facility];  Service: Ophthalmology;  Laterality: Right;    Social History:  reports that he has quit smoking. His smoking use included cigarettes. He has a 17.50 pack-year smoking history. He has never used smokeless tobacco. He reports that he does not drink alcohol and does not use drugs. Family History:  Family History  Problem Relation Age of Onset  .  Hypertension Mother   . Stroke Mother   . Heart disease Father   . Other Neg Hx        gynecomastia  . Colon cancer Neg Hx   . Colon polyps Neg Hx      HOME MEDICATIONS: Allergies as of 07/30/2020      Reactions   Atenolol Shortness Of Breath   Amlodipine Besy-benazepril Hcl Other (See Comments)   Headaches      Medication List       Accurate as of July 30, 2020  9:39 AM. If you have any questions, ask your nurse or doctor.        Alpha-Lipoic Acid 200 MG Caps Take 200 mg by mouth daily.   amLODipine 10 MG tablet Commonly known as: NORVASC TAKE 1 TABLET BY MOUTH ONCE DAILY   aspirin EC 81 MG tablet Take 1 tablet (81 mg total) by mouth daily.   atorvastatin 80 MG tablet Commonly known as: LIPITOR TAKE 1 TABLET BY MOUTH ONCE DAILY.   [name] 20-37.5 MG tablet Generic drug: isosorbide-hydrALAZINE TAKE (2) TABLETS BY MOUTH THREE TIMES DAILY.   carvedilol 25 MG tablet Commonly known as: COREG Take 25 mg by mouth 2 (two) times daily with a meal.   Entresto 49-51 MG Generic drug: sacubitril-valsartan TAKE 1 TABLET IN THE MORNING AND AT BEDTIME. PLEASE CALL FOR OFFICE VISIT [phone]   fenofibrate 145 MG tablet Commonly known as: TRICOR TAKE 1 TABLET BY MOUTH ONCE DAILY.   folic acid 440 MCG tablet Commonly known as: FOLVITE Take 400 mcg by mouth daily.   FreeStyle Libre 14 Day Reader Kerrin Mo 1 Device by Does not apply route as directed.   FreeStyle Libre 14 Day Sensor Misc 1 Device by Does not apply route as directed.   latanoprost 0.005 % ophthalmic solution Commonly known as: XALATAN Place 1 drop into both eyes at bedtime.   NovoLIN 70/30 (70-30) 100 UNIT/ML injection Generic drug: insulin NPH-regular Human Inject 14 Units into the skin 2 (two) times daily with a meal.   vitamin B-12 1000 MCG tablet Commonly known as: CYANOCOBALAMIN Take 1,000 mcg by mouth daily.        OBJECTIVE:   Vital Signs: BP (!) 138/78 (BP Location: Left Arm, Patient  Position: Sitting, Cuff Size: Large)   Pulse 88   Ht 5\' 5"  (1.651 m)   Wt 185 lb 6.4 oz (84.1 kg)   SpO2 98%   BMI 30.85 kg/m   Wt Readings from Last 3 Encounters:  07/30/20 185 lb 6.4 oz (84.1 kg)  01/31/20 183 lb (83 kg)  10/12/19 181 lb 6.4 oz (82.3 kg)     Exam: General: Pt appears well and is in NAD  Lungs: Clear with good BS bilat with no rales, rhonchi, or wheezes  Heart: RRR with normal S1 and S2 and no gallops; no murmurs; no  rub  Extremities: Trace pretibial edema. No tremor.   Skin: Normal texture and temperature to palpation. No rash noted.  Neuro: MS is good with appropriate affect, pt is alert and Ox3     DM foot exam: 01/31/2020 The skin of the feet is intact without sores or ulcerations.Bilateral planter callous formation noted and thickened, discolored toe nails The pedal pulses are undetectable  The sensation is decreased  to a screening 5.07, 10 gram monofilament bilaterally            DATA REVIEWED:  Lab Results  Component Value Date   HGBA1C 7.9 (A) 07/30/2020   HGBA1C 6.8 (A) 01/31/2020   HGBA1C 6.5 (A) 09/27/2019   Lab Results  Component Value Date   LDLCALC 70 10/03/2019   CREATININE 1.91 (H) 10/03/2019    Lab Results  Component Value Date   CHOL 112 10/03/2019   HDL 26 (L) 10/03/2019   LDLCALC 70 10/03/2019   TRIG 78 10/03/2019   CHOLHDL 4.3 10/03/2019       ASSESSMENT / PLAN / RECOMMENDATIONS:   1) Type 2 Diabetes Mellitus, Sub-OPtimally controlled, With CKD III complications and PVD - Most recent A1c of 7.9 %. Goal A1c < 7.0 %.    - Unfortunately his A1c has trended up, this is because of the [facility], the readings have been falsely low , and he has been correcting for them with eating, not realizing these readings are false, that way has been raising his glucose levels. His BG today in the office was 197 mg/dL ( post breakfast) but his Freestyle libre indicated 148 mg/dL, that's a 50 point higher than CGM. Pt has been using  the freestyle libre on abdomen, rather than arm, could be another reason for inaccuracy.  - Will attempt to prescribe Dexcom - I offered to switch his novolin to insulin analogue given CKD, but pt declined, stating his deductible is very high   MEDICATIONS: - Increase Novolin Mix (70/30) 15 units with Breakfast and with supper    EDUCATION / INSTRUCTIONS:  BG monitoring instructions: Patient is instructed to check his blood sugars 2 times a day, before breakfast and supper.  Call [facility] if: BG persistently < 70 or > 300. . I reviewed the Rule of 15 for the treatment of hypoglycemia in detail with the patient. Literature supplied.    F/U  6 months     Signed electronically by: Mack Guise, MD  [facility] Endocrinology  [facility] [address] [place], [place] [address] Phone: [phone] FAX: [phone]   CC: Glenda Chroman, MD [place] [address] Phone: [phone]  Fax: [phone]  Return to Endocrinology clinic as below: No future appointments.

## 2020-07-31 NOTE — Telephone Encounter (Addendum)
Patient's wife says copay for Dexcom is $197.00/month, and they can not afford this.  They are going to continue using the [place], but will test his blood sugar with a fingerstick twice daily--acB, and acS one day, then acL and HS the next day.  If not ok, please contact wife.

## 2020-07-31 NOTE — Telephone Encounter (Signed)
Approval for dexcom sensors Approvedon August 2 [place] Case: [id number], Status: Approved, Coverage Starts on: 07/30/2020 12:00:00 AM, Coverage Ends on: 07/29/2120 12:00:00 AM.

## 2020-08-16 ENCOUNTER — Other Ambulatory Visit (HOSPITAL_COMMUNITY): Payer: Self-pay | Admitting: Cardiology

## 2020-08-27 DIAGNOSIS — Z299 Encounter for prophylactic measures, unspecified: Secondary | ICD-10-CM | POA: Diagnosis not present

## 2020-08-27 DIAGNOSIS — E7800 Pure hypercholesterolemia, unspecified: Secondary | ICD-10-CM | POA: Diagnosis not present

## 2020-08-27 DIAGNOSIS — I10 Essential (primary) hypertension: Secondary | ICD-10-CM | POA: Diagnosis not present

## 2020-08-27 DIAGNOSIS — E1122 Type 2 diabetes mellitus with diabetic chronic kidney disease: Secondary | ICD-10-CM | POA: Diagnosis not present

## 2020-08-27 DIAGNOSIS — Z1339 Encounter for screening examination for other mental health and behavioral disorders: Secondary | ICD-10-CM | POA: Diagnosis not present

## 2020-08-27 DIAGNOSIS — I429 Cardiomyopathy, unspecified: Secondary | ICD-10-CM | POA: Diagnosis not present

## 2020-08-27 DIAGNOSIS — Z7189 Other specified counseling: Secondary | ICD-10-CM | POA: Diagnosis not present

## 2020-08-27 DIAGNOSIS — R5383 Other fatigue: Secondary | ICD-10-CM | POA: Diagnosis not present

## 2020-08-27 DIAGNOSIS — E1165 Type 2 diabetes mellitus with hyperglycemia: Secondary | ICD-10-CM | POA: Diagnosis not present

## 2020-08-27 DIAGNOSIS — Z0000 Encounter for general adult medical examination without abnormal findings: Secondary | ICD-10-CM | POA: Diagnosis not present

## 2020-08-27 DIAGNOSIS — Z125 Encounter for screening for malignant neoplasm of prostate: Secondary | ICD-10-CM | POA: Diagnosis not present

## 2020-08-27 DIAGNOSIS — Z1331 Encounter for screening for depression: Secondary | ICD-10-CM | POA: Diagnosis not present

## 2020-08-27 DIAGNOSIS — Z79899 Other long term (current) drug therapy: Secondary | ICD-10-CM | POA: Diagnosis not present

## 2020-09-06 ENCOUNTER — Ambulatory Visit: Payer: PPO | Attending: Internal Medicine

## 2020-09-06 DIAGNOSIS — Z23 Encounter for immunization: Secondary | ICD-10-CM

## 2020-09-06 NOTE — Progress Notes (Signed)
   Covid-19 Vaccination Clinic  Name:  Jamel Holzmann    MRN: [id number] DOB: June 18, 1956  09/06/2020  Mr. Leighty was observed post Covid-19 immunization for 30 minutes based on pre-vaccination screening without incident. He was provided with Vaccine Information Sheet and instruction to access the V-Safe system.   Mr. Ripberger was instructed to call 911 with any severe reactions post vaccine: Marland Kitchen Difficulty breathing  . Swelling of face and throat  . A fast heartbeat  . A bad rash all over body  . Dizziness and weakness

## 2020-09-14 ENCOUNTER — Other Ambulatory Visit (HOSPITAL_COMMUNITY): Payer: Self-pay | Admitting: Cardiology

## 2020-10-15 DIAGNOSIS — Z23 Encounter for immunization: Secondary | ICD-10-CM | POA: Diagnosis not present

## 2020-10-18 ENCOUNTER — Ambulatory Visit (HOSPITAL_COMMUNITY)
Admission: RE | Admit: 2020-10-18 | Discharge: 2020-10-18 | Disposition: A | Discharge status: Home / Self Care | Payer: PPO | Source: Ambulatory Visit | Attending: Cardiology | Admitting: Cardiology

## 2020-10-18 ENCOUNTER — Other Ambulatory Visit: Payer: Self-pay

## 2020-10-18 ENCOUNTER — Telehealth (HOSPITAL_COMMUNITY): Payer: Self-pay | Admitting: Pharmacy Technician

## 2020-10-18 ENCOUNTER — Encounter (HOSPITAL_COMMUNITY): Payer: Self-pay | Admitting: Cardiology

## 2020-10-18 VITALS — BP 164/90 | HR 84 | Wt 189.6 lb

## 2020-10-18 DIAGNOSIS — I5022 Chronic systolic (congestive) heart failure: Secondary | ICD-10-CM

## 2020-10-18 DIAGNOSIS — I10 Essential (primary) hypertension: Secondary | ICD-10-CM | POA: Diagnosis not present

## 2020-10-18 DIAGNOSIS — Z7982 Long term (current) use of aspirin: Secondary | ICD-10-CM | POA: Insufficient documentation

## 2020-10-18 DIAGNOSIS — Z79899 Other long term (current) drug therapy: Secondary | ICD-10-CM | POA: Insufficient documentation

## 2020-10-18 DIAGNOSIS — E785 Hyperlipidemia, unspecified: Secondary | ICD-10-CM

## 2020-10-18 DIAGNOSIS — N183 Chronic kidney disease, stage 3 unspecified: Secondary | ICD-10-CM | POA: Diagnosis not present

## 2020-10-18 DIAGNOSIS — Z87891 Personal history of nicotine dependence: Secondary | ICD-10-CM | POA: Insufficient documentation

## 2020-10-18 DIAGNOSIS — I739 Peripheral vascular disease, unspecified: Secondary | ICD-10-CM | POA: Diagnosis not present

## 2020-10-18 DIAGNOSIS — E1122 Type 2 diabetes mellitus with diabetic chronic kidney disease: Secondary | ICD-10-CM | POA: Diagnosis not present

## 2020-10-18 DIAGNOSIS — I130 Hypertensive heart and chronic kidney disease with heart failure and stage 1 through stage 4 chronic kidney disease, or unspecified chronic kidney disease: Secondary | ICD-10-CM | POA: Insufficient documentation

## 2020-10-18 DIAGNOSIS — Z794 Long term (current) use of insulin: Secondary | ICD-10-CM | POA: Insufficient documentation

## 2020-10-18 LAB — COMPREHENSIVE METABOLIC PANEL
ALT: 19 U/L (ref 0–44)
AST: 20 U/L (ref 15–41)
Albumin: 3.7 g/dL (ref 3.5–5.0)
Alkaline Phosphatase: 55 U/L (ref 38–126)
Anion gap: 10 (ref 5–15)
BUN: 19 mg/dL (ref 8–23)
CO2: 22 mmol/L (ref 22–32)
Calcium: 8.9 mg/dL (ref 8.9–10.3)
Chloride: 109 mmol/L (ref 98–111)
Creatinine, Ser: 1.75 mg/dL — ABNORMAL HIGH (ref 0.61–1.24)
GFR, Estimated: 43 mL/min — ABNORMAL LOW (ref >60)
Glucose, Bld: 122 mg/dL — ABNORMAL HIGH (ref 70–99)
Potassium: 4.1 mmol/L (ref 3.5–5.1)
Sodium: 141 mmol/L (ref 135–145)
Total Bilirubin: 0.6 mg/dL (ref 0.3–1.2)
Total Protein: 7.2 g/dL (ref 6.5–8.1)

## 2020-10-18 LAB — LIPID PANEL
Cholesterol: 125 mg/dL (ref 0–200)
HDL: 27 mg/dL — ABNORMAL LOW (ref >40)
LDL Cholesterol: 82 mg/dL (ref 0–99)
Total CHOL/HDL Ratio: 4.6 RATIO
Triglycerides: 79 mg/dL (ref <150)
VLDL: 16 mg/dL (ref 0–40)

## 2020-10-18 MED ORDER — ENTRESTO 97-103 MG PO TABS: 1.0000 | ORAL_TABLET | Freq: Two times a day (BID) | ORAL | 6 refills | Status: DC

## 2020-10-18 NOTE — Progress Notes (Signed)
Patient ID: Derek Mckenzie, male   DOB: 07/27/1956, 64 y.o.   MRN: [id number] PCP: Dr. Woody Seller HF Cardiology: Dr. Aundra Dubin  Reason for Visit: F/u for CHF and HTN   64 y.o. with history of HTN, DM, PAD, CKD and systolic CHF presents for CHF clinic followup.  He has had a long history of difficult-to-control HTN.  Medications were adjusted and repeat echo in 12/17 showed improvement in EF to 60-65%.  He has known PAD, significant on 12/17 peripheral arterial dopplers.  He saw Dr. Gwenlyn Found for evaluation and conservative treatment for now was recommended.  Echo in 12/18 showed EF 60-65%, moderate LVH.   Echo 11/20 showed EF 60-65%, RV normal.   Ok note, he did not tolerate spironolactone due to hyperkalemia.   He returns to clinic for routine f/u. Reports he feels well. Denies resting and exertional dyspnea. No orthopnea/ PND or LEE. Also denies CP. No LE claudication. Reports full med compliance but has not yet taken today's morning meds. BP 160/90 (same on repeat check). Reports his BP at home is usually ~140s/70-80s. EKG shows NSR w/ mild inferolateral TWIs, c/w prior EKGs. His wt is up 8 lb since his visit last year. ReDs Clip 33%.    Labs (11/16): LDL 109, HDL 26, K 4.1, creatinine 1.42, BNP 197, TSH normal Labs (3/17): K 4.2, creatinine 1.73 Labs (12/17): K 5.4, creatinine 1.5, TGs 448, unable to calculate LDL, BNP 51, hgb 12.4 Labs (1/18): K 5.1, creatinine 1.62 Labs (7/18): LDL 84, HDL 21, TGs 215 Labs (8/18): K 5.1, creatinine 1.84 Labs (10/18): LDL 64, HDL 22, LFTs normal Labs (12/18): K 4.8, creatinine 1.85 Labs (1/20): K 5.1, creatinine 1.82 Labs (10/20): K 4.2, creatinine 1.91, LDL 70, TG 78 Labs (8/21): Hgb A1c 7.9   PMH:  1. Type II diabetes. 2. HTN: x years, poorly controlled. 11/16 renal artery dopplers with no significant stenosis.  3. Deafness 4. PAD: 2012 peripheral arterial dopplers with right distal SFA stenosis and left mid SFA stenosis.  - Peripheral arterial dopplers  (12/17): Probable right iliac obstruction and distal left SFA obstruction.  - ABIs (12/18): 0.61 right, 0.9 left (improved).  - ABIs (1/20): 0.62 right, 0.85 left 5. Sleep study negative for OSA 6. Chronic systolic CHF: Echo (82/42) with EF 30-35%, moderate LVH.  Possible hypertensive cardiomyopathy.  - Echo (12/17): EF 60-65%.  - Echo (12/18): EF 60-65%, moderate LVH.  7. CKD: Stage 3.  8. Hyperlipidemia  FH: No cardiac disease that he knows of.  +HTN.   Social History   Socioeconomic History  . Marital status: Single    Spouse name: Not on file  . Number of children: Not on file  . Years of education: Not on file  . Highest education level: Not on file  Occupational History  . Not on file  Tobacco Use  . Smoking status: Former Smoker    Packs/day: 0.50    Years: 35.00    Pack years: 17.50    Types: Cigarettes  . Smokeless tobacco: Never Used  . Tobacco comment: "quit smoking in ~ 2007"  Substance and Sexual Activity  . Alcohol use: No  . Drug use: No  . Sexual activity: Never  Other Topics Concern  . Not on file  Social History Narrative   IN A LONG TERM PLATONIC RELATIONSHIP. BEEN GOOD FRIENDS FOR YEARS AND TAKING CARE OF EACH OTHER. SEES KIDS VERY RARELY. 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 HEARING AID DID  NOT WORK.   Social Determinants of Health   Financial Resource Strain:   . Difficulty of Paying Living Expenses: Not on file  Food Insecurity:   . Worried About Charity fundraiser in the Last Year: Not on file  . Ran Out of Food in the Last Year: Not on file  Transportation Needs:   . Lack of Transportation (Medical): Not on file  . Lack of Transportation (Non-Medical): Not on file  Physical Activity:   . Days of Exercise per Week: Not on file  . Minutes of Exercise per Session: Not on file  Stress:   . Feeling of Stress : Not on file  Social Connections:   . Frequency of Communication with Friends and Family: Not on file  .  Frequency of Social Gatherings with Friends and Family: Not on file  . Attends Religious Services: Not on file  . Active Member of Clubs or Organizations: Not on file  . Attends Archivist Meetings: Not on file  . Marital Status: Not on file  Intimate Partner Violence:   . Fear of Current or Ex-Partner: Not on file  . Emotionally Abused: Not on file  . Physically Abused: Not on file  . Sexually Abused: Not on file   ROS: All systems reviewed and negative except as per HPI.   Current Outpatient Medications  Medication Sig Dispense Refill  . Alpha-Lipoic Acid 200 MG CAPS Take 200 mg by mouth daily.     Marland Kitchen amLODipine (NORVASC) 10 MG tablet TAKE 1 TABLET BY MOUTH ONCE DAILY (Patient taking differently: Take 10 mg by mouth daily. ) 30 tablet 3  . aspirin EC 81 MG tablet Take 1 tablet (81 mg total) by mouth daily. 30 tablet 3  . atorvastatin (LIPITOR) 80 MG tablet TAKE 1 TABLET BY MOUTH ONCE DAILY. 90 tablet 3  . [name] 20-37.5 MG tablet TAKE (2) TABLETS BY MOUTH THREE TIMES DAILY. 180 tablet 0  . carvedilol (COREG) 25 MG tablet Take 25 mg by mouth 2 (two) times daily with a meal.    . Continuous Blood Gluc Receiver (DEXCOM G6 RECEIVER) DEVI 1 Device by Does not apply route as directed. 1 each 0  . Continuous Blood Gluc Sensor (DEXCOM G6 SENSOR) MISC 1 Device by Does not apply route as directed. 2 each 11  . Continuous Blood Gluc Transmit (DEXCOM G6 TRANSMITTER) MISC 1 Device by Does not apply route as directed. 1 each 3  . ENTRESTO 49-51 MG TAKE 1 TABLET IN THE MORNING AND AT BEDTIME. 60 tablet 0  . fenofibrate (TRICOR) 145 MG tablet TAKE 1 TABLET BY MOUTH ONCE DAILY. 90 tablet 3  . folic acid (FOLVITE) 426 MCG tablet Take 400 mcg by mouth daily.     . insulin NPH-regular Human (NOVOLIN 70/30) (70-30) 100 UNIT/ML injection Inject 15 Units into the skin 2 (two) times daily with a meal. 30 mL 6  . latanoprost (XALATAN) 0.005 % ophthalmic solution Place 1 drop into both eyes at bedtime.     . vitamin B-12 (CYANOCOBALAMIN) 1000 MCG tablet Take 1,000 mcg by mouth daily.      No current facility-administered medications for this encounter.   Vital Signs  BP (!) 160/90 (BP Location: Left Arm, Patient Position: Sitting)   Pulse 87   Wt 86 kg (189 lb 9.6 oz)   SpO2 99%   BMI 31.55 kg/m  PHYSICAL EXAM: ReDs Clip 33%  General:  Well appearing, moderately obese. No respiratory difficulty HEENT: normal Neck:  supple. no JVD. Carotids 2+ bilat; no bruits. No lymphadenopathy or thyromegaly appreciated. Cor: PMI nondisplaced. Regular rate & rhythm. No rubs, gallops or murmurs. Lungs: clear Abdomen: soft, nontender, nondistended. No hepatosplenomegaly. No bruits or masses. Good bowel sounds. Extremities: no cyanosis, clubbing, rash, edema, + decreased pedal pulses bilaterally  Neuro: alert & oriented x 3, cranial nerves grossly intact. moves all 4 extremities w/o difficulty. Affect pleasant.   Assessment/Plan: 1. Chronic systolic CHF: Echo 76/54 with EF 30-35%.  Given improvement in EF to 60-65% in 12/17 and 12/18 with BP control, suspect this was a hypertensive cardiomyopathy.  EF stable on repeat echo 11/20, 60-65%, mod LVH, RV normal.  - NYHA Class II, Volume status stable on exam and by ReDs clip, 33% - Continue Entresto, Increase to 97-103 bid - Continue [name] 2 tabs tid  - Continue Coreg 25 mg bid  - He is off spironolactone due to hyperkalemia.  - Check CMP today. F/u BMP in 7 days  2. HTN: Renal arterial dopplers in 11/16 did not show evidence for renal artery stenosis. Sleep study negative for OSA. BP elevated today 160/90 but has not yet taken am meds. BP at home averages in 140s/70s-80, per pt report.  - on max dose amlodipine, [name] and Coreg - Increase Entresto to 97-103 bid 3. PAD: denies claudication.  ABIs abnormal in 1/20 but stable. Multiple risk factors for disease progression and decreased pedal pulses on exam - repeat bilateral LE arterial dopplers   -  Continue ASA 81 and statin.   4. Hyperlipidemia: LDL goal w/ PAD < 70 mg/dl. - continue atorvastatin 80  - Check FLP today + HFTs   - if not at goal, will add Zetia  5. CKD: Stage 3.  Check CMP today.  6. T2DM  - most recent hgb A1c 7.9 - on insulin, managed by PCP   F/u w/ PharmD in 3-4 weeks for repeat BP check/ further med titration if needed. F/u w/ Dr. Aundra Dubin in 6 months.    Lyda Jester, PA-C  10/18/2020

## 2020-10-18 NOTE — Progress Notes (Signed)
ReDS Vest / Clip - 10/18/20 1000      ReDS Vest / Clip   Station Marker B    Ruler Value 35    ReDS Value Range Low volume    ReDS Actual Value 33

## 2020-10-18 NOTE — Patient Instructions (Signed)
Continue Entresto 49/51 mg FOR NOW, once we get you approved for patient assistance through [employer] we will increase your dose to 97/103 mg Twice daily   We have also started the application to get you approved for patient assistance for [name] as well, PLEASE SEND [place] YOUR PROOF OF INCOME, this can be emailed to:   [email or web address]  Labs done today, your results will be available in MyChart, we will contact you for abnormal readings.  Your physician has requested that you have a lower extremity arterial exercise duplex. During this test, exercise and ultrasound are used to evaluate arterial blood flow in the legs. Allow one hour for this exam. There are no restrictions or special instructions. The [place] office will call you to schedule this appointment  Please follow up with our heart failure pharmacist in 4 weeks  Please call our office in March to schedule your follow up appointment  If you have any questions or concerns before your next appointment please send [place] a message through [place] or call our office at [phone].    TO LEAVE A MESSAGE FOR THE NURSE SELECT OPTION 2, PLEASE LEAVE A MESSAGE INCLUDING: . YOUR NAME . DATE OF BIRTH . CALL BACK NUMBER . REASON FOR CALL**this is important as we prioritize the call backs  [place] AS LONG AS YOU CALL BEFORE 4:00 PM  At the [facility], you and your health needs are our priority. As part of our continuing mission to provide you with exceptional heart care, we have created designated Provider Care Teams. These Care Teams include your primary Cardiologist (physician) and Advanced Practice Providers (APPs- Physician Assistants and Nurse Practitioners) who all work together to provide you with the care you need, when you need it.   You may see any of the following providers on your designated Care Team at your next follow up: Marland Kitchen Dr Glori Bickers . Dr Loralie Champagne . Darrick Grinder,  NP . Lyda Jester, PA . Audry Riles, PharmD   Please be sure to bring in all your medications bottles to every appointment.

## 2020-10-18 NOTE — Progress Notes (Signed)
Medication Samples have been provided to the patient.  Drug name: Derek Mckenzie       Strength: 49/51mg         Qty: 1  LOT: [id number]  Exp.Date: 5/23  Dosing instructions: take 1 tab Twice daily   The patient has been instructed regarding the correct time, dose, and frequency of taking this medication, including desired effects and most common side effects.   Derek Mckenzie 11:20 AM 10/18/2020   Medication Samples have been provided to the patient.  Drug name: [name]       Strength: 20/37.5mg         Qty: 4  LOT: [id number]  Exp.Date: 1/22  Dosing instructions: take 2 tabs Three times a day   The patient has been instructed regarding the correct time, dose, and frequency of taking this medication, including desired effects and most common side effects.   Derek Mckenzie 11:21 AM 10/18/2020

## 2020-10-18 NOTE — Telephone Encounter (Signed)
Patient's PAN grant does not have enough funds to cover his Entresto.   Started an [place] for [employer] assistance. Also, started and [employer] planner for [employer].  Will fax once signatures are obtained.

## 2020-10-19 MED ORDER — BIDIL 20-37.5 MG PO TABS
ORAL_TABLET | ORAL | 3 refills | Status: DC
Start: 2020-10-19 — End: 2020-10-26

## 2020-10-19 NOTE — Telephone Encounter (Signed)
Sent in applications via fax.  Will follow up.  

## 2020-10-19 NOTE — Addendum Note (Signed)
Encounter addended by: Scarlette Calico, RN on: 10/19/2020 4:24 PM  Actions taken: Order list changed

## 2020-10-23 DIAGNOSIS — M79676 Pain in unspecified toe(s): Secondary | ICD-10-CM | POA: Diagnosis not present

## 2020-10-23 DIAGNOSIS — E1151 Type 2 diabetes mellitus with diabetic peripheral angiopathy without gangrene: Secondary | ICD-10-CM | POA: Diagnosis not present

## 2020-10-23 DIAGNOSIS — B351 Tinea unguium: Secondary | ICD-10-CM | POA: Diagnosis not present

## 2020-10-23 DIAGNOSIS — E1142 Type 2 diabetes mellitus with diabetic polyneuropathy: Secondary | ICD-10-CM | POA: Diagnosis not present

## 2020-10-23 DIAGNOSIS — L84 Corns and callosities: Secondary | ICD-10-CM | POA: Diagnosis not present

## 2020-10-24 NOTE — Telephone Encounter (Signed)
Spoke with [employer], ([name]) assistance. According to the representative, the company does not approve patient's for assistance if insurance pays anything towards their out of pocket cost.  Zenda Alpers has a cash program that the patient would qualify for. His 30 day co-pay through that program would be $85, a 90 day supply would be $240. 30 day co-pay through insurance right now is $168.63. Patient is on the wait list for when the PAN grant's Heart Failure fund does re-open.  Called and spoke with [name]. She is going to pass this information along. The patient can either enroll in [employer]'s cash program or he can get the medications separated and pay $5 a piece monthly. She is going to call back tomorrow.

## 2020-10-24 NOTE — Telephone Encounter (Addendum)
[employer] called stating that the patient needed a PA for [employer]. Once I attempted to submit the PA, his insurance stated that this medication is available without authorization. [employer], informed the representative of this information. The representative requested that we send that documentation in. The only documentation that I have is a screen shot of the determination through Covermymeds. That was sent in via fax.  Will follow up with [employer].  [name] called in stating that the patient will still have a $80 co-pay if he is approved for assistance with [name], according to the phone call they received from the company. Reached out to [place] and left a message for them to follow up with me regarding an assistance determination.

## 2020-10-26 ENCOUNTER — Telehealth (HOSPITAL_COMMUNITY): Payer: Self-pay | Admitting: Pharmacy Technician

## 2020-10-26 MED ORDER — HYDRALAZINE HCL 50 MG PO TABS: 75.0000 mg | ORAL_TABLET | Freq: Three times a day (TID) | ORAL | 6 refills | Status: DC

## 2020-10-26 MED ORDER — ISOSORBIDE MONONITRATE ER 60 MG PO TB24: 60.0000 mg | ORAL_TABLET | Freq: Two times a day (BID) | ORAL | 6 refills | Status: DC

## 2020-10-26 NOTE — Telephone Encounter (Signed)
Advanced Heart Failure Patient Advocate Encounter   Patient was approved to receive Entresto from [employer]  Patient ID: [id number] Effective dates: 10/26/20 through 12/28/20  Spoke with patient's friend regarding approval and phone number to [employer].  Charlann Boxer, CPhT

## 2020-10-26 NOTE — Telephone Encounter (Signed)
Due to cost will split [name] up and give pt rx for Hydralazine and Imdur. Per Sherene Sires D per pt's [name] dose he should get Hydralazine 75 mg TID and Imdur 60 mg BID. RX sent in

## 2020-11-13 DIAGNOSIS — H401132 Primary open-angle glaucoma, bilateral, moderate stage: Secondary | ICD-10-CM | POA: Diagnosis not present

## 2020-11-15 ENCOUNTER — Other Ambulatory Visit: Payer: Self-pay

## 2020-11-15 ENCOUNTER — Ambulatory Visit (HOSPITAL_COMMUNITY)
Admission: RE | Admit: 2020-11-15 | Discharge: 2020-11-15 | Disposition: A | Discharge status: Home / Self Care | Payer: PPO | Source: Ambulatory Visit | Attending: Cardiology | Admitting: Cardiology

## 2020-11-15 VITALS — BP 162/82 | HR 91 | Wt 188.0 lb

## 2020-11-15 DIAGNOSIS — N183 Chronic kidney disease, stage 3 unspecified: Secondary | ICD-10-CM | POA: Insufficient documentation

## 2020-11-15 DIAGNOSIS — E1151 Type 2 diabetes mellitus with diabetic peripheral angiopathy without gangrene: Secondary | ICD-10-CM | POA: Insufficient documentation

## 2020-11-15 DIAGNOSIS — E785 Hyperlipidemia, unspecified: Secondary | ICD-10-CM | POA: Insufficient documentation

## 2020-11-15 DIAGNOSIS — E1122 Type 2 diabetes mellitus with diabetic chronic kidney disease: Secondary | ICD-10-CM | POA: Diagnosis not present

## 2020-11-15 DIAGNOSIS — I5022 Chronic systolic (congestive) heart failure: Secondary | ICD-10-CM

## 2020-11-15 DIAGNOSIS — I130 Hypertensive heart and chronic kidney disease with heart failure and stage 1 through stage 4 chronic kidney disease, or unspecified chronic kidney disease: Secondary | ICD-10-CM | POA: Diagnosis not present

## 2020-11-15 DIAGNOSIS — Z7982 Long term (current) use of aspirin: Secondary | ICD-10-CM | POA: Diagnosis not present

## 2020-11-15 MED ORDER — HYDRALAZINE HCL 100 MG PO TABS
100.0000 mg | ORAL_TABLET | Freq: Three times a day (TID) | ORAL | 3 refills | Status: DC
Start: 2020-11-15 — End: 2021-11-29

## 2020-11-15 MED ORDER — ENTRESTO 49-51 MG PO TABS: 1.0000 | ORAL_TABLET | Freq: Two times a day (BID) | ORAL | 3 refills | Status: DC

## 2020-11-15 NOTE — Patient Instructions (Signed)
It was a pleasure seeing you today!  MEDICATIONS: -We are changing your medications today -Increase hydralazine to 100 mg (1 tablet) three times daily. You may take 2 tablets of the 50 mg strength three times daily until you pick up the new strength.  -Call if you have questions about your medications.  LABS: -We will call you if your labs need attention.  NEXT APPOINTMENT: Return to clinic in 3 weeks with Dr. Aundra Dubin.  In general, to take care of your heart failure: -Limit your fluid intake to 2 Liters (half-gallon) per day.   -Limit your salt intake to ideally 2-3 grams (2000-3000 mg) per day. -Weigh yourself daily and record, and bring that "weight diary" to your next appointment.  (Weight gain of 2-3 pounds in 1 day typically means fluid weight.) -The medications for your heart are to help your heart and help you live longer.   -Please contact us before stopping any of your heart medications.  Call the clinic at [phone] with questions or to reschedule future appointments.

## 2020-11-15 NOTE — Progress Notes (Signed)
PCP: Dr. Woody Seller HF Cardiology: Dr. Aundra Dubin  HPI:  64 y.o. with history of HTN, DM, PAD, CKD and systolic CHF presents for CHF clinic followup.  He has had a long history of difficult-to-control HTN.  Medications were adjusted and repeat echo in 12/17 showed improvement in EF to 60-65%.  He has known PAD which was significant on 12/17 peripheral arterial dopplers.  He saw Dr. Gwenlyn Found for evaluation and conservative treatment was recommended.  Echo in 12/18 showed EF 60-65% and moderate LVH. Echo in 11/20 showed EF 60-65%, RV normal.   Of note, he did not tolerate spironolactone due to hyperkalemia.   Recently returned for routine follow-up with Lyda Jester, PA on 10/18/20. He reported feeling well and denied resting or exertional dyspnea. No orthopnea/ PND or LEE. Also denied CP. No LE claudication. Reported full med compliance but had not yet taken morning meds. BP was 160/90. He reported his BP at home was usually ~140s/70-80s. EKG showed NSR w/ mild inferolateral TWIs, c/w prior EKGs. His weight was up 8 lbs since his visit last year. ReDs Clip 33%.   Today he returns to HF clinic for pharmacist medication titration. At last visit with APP, Entresto was increased to 97/103 mg BID. Today, he reports he had severe diarrhea and muscle cramps on this increased dose, so he went back down to Entresto 49/51 mg BID and symptoms resolved.  Additionally, his [name] was changed to hydralazine and Imdur separate components due to cost. He is overall feeling good today. BP elevated at 162/82 today but he has not taken morning medications yet. No dizziness, lightheadedness, or fatigue. No chest pain or palpitations. Home weight has been stable at 179-182 lbs. No LEE, PND, or orthopnea. He is able to walk 1/2 mile before feeling SOB. Appetite is good and he follows low-salt diet.  HF Medications: Carvedilol 25 mg BID Entresto 49/51 mg BID Hydralazine 75 mg TID Isosorbide mononitrate 60 mg BID  Has the patient  been experiencing any side effects to the medications prescribed?  No - GI side effects resolved after decreasing Entresto dose to 49/51 mg BID  Does the patient have any problems obtaining medications due to transportation or finances?   Not since changing [name] to separate components - has [employer]. Maryan Puls from [employer].  Understanding of regimen: good Understanding of indications: good Potential of compliance: good Patient understands to avoid NSAIDs. Patient understands to avoid decongestants.    Pertinent Lab Values (10/18/20): Marland Kitchen Serum creatinine 1.75, BUN 19, Potassium 4.1, Sodium 141   Vital Signs: . Weight: 188 lbs (last clinic weight: 189 lbs) . Blood pressure: 162/82  . Heart rate: 91   Assessment: 1. Chronic systolic CHF: Echo 09/47 with EF 30-35%.  Given improvement in EF to 60-65% in 12/17 and 12/18 with BP control, suspect this was a hypertensive cardiomyopathy.  EF stable on repeat echo 11/20, 60-65%, mod LVH, RV normal.  - NYHA Class II, Volume status stable on exam  - Continue carvedilol 25 mg BID - Continue Entresto 49/51 mg BID. Unable to tolerate higher dose due to diarrhea/muscle cramps. - Increase hydralazine to 100 mg TID - Continue isosorbide mononitrate 60 mg BID - He is off spironolactone due to hyperkalemia.    2. HTN: Renal arterial dopplers in 11/16 did not show evidence for renal artery stenosis. Sleep study negative for OSA. BP elevated today 160/90 but has not yet taken am meds. BP at home averages in 140s/70s-80, per pt report.  -  Continue amlodipine 10 mg daily - Increase hydralazine to 100 mg TID as above - Continue carvedilol, Entresto, and isosorbide mononitrate as above  3. PAD: denies claudication.  ABIs abnormal in 1/20 but stable. Multiple risk factors for disease progression and decreased pedal pulses on exam - repeat bilateral LE arterial dopplers  - Continue ASA 81 mg daily - Continue atorvastatin 80 mg  daily    4. Hyperlipidemia: LDL goal w/ PAD < 70 mg/dl. - Continue atorvastatin 80 mg daily  5. CKD: Stage 3.    6. T2DM  - Most recent hgb A1c 7.9 - On insulin, managed by PCP    Plan: 1) Medication changes: Based on clinical presentation, vital signs and recent labs will increase hydralazine to 100 mg TID 2) Follow-up: 12/05/20 w/ Dr. Aundra Dubin   Richardine Service, PharmD PGY2 Cardiology Pharmacy Resident  Audry Riles, PharmD, BCPS, [facility], CPP Heart Failure Clinic Pharmacist [phone]

## 2020-11-26 ENCOUNTER — Telehealth (HOSPITAL_COMMUNITY): Payer: Self-pay | Admitting: Pharmacist

## 2020-11-26 MED ORDER — ENTRESTO 49-51 MG PO TABS
1.0000 | ORAL_TABLET | Freq: Two times a day (BID) | ORAL | 3 refills | Status: DC
Start: 2020-11-26 — End: 2021-11-18

## 2020-11-26 NOTE — Telephone Encounter (Signed)
Sent updated Entresto prescription to [employer].

## 2020-12-03 ENCOUNTER — Ambulatory Visit: Payer: PPO | Admitting: Internal Medicine

## 2020-12-03 ENCOUNTER — Other Ambulatory Visit: Payer: Self-pay

## 2020-12-03 ENCOUNTER — Encounter: Payer: Self-pay | Admitting: Internal Medicine

## 2020-12-03 VITALS — BP 160/84 | HR 92 | Ht 70.0 in | Wt 178.1 lb

## 2020-12-03 DIAGNOSIS — N1832 Chronic kidney disease, stage 3b: Secondary | ICD-10-CM | POA: Diagnosis not present

## 2020-12-03 DIAGNOSIS — E1142 Type 2 diabetes mellitus with diabetic polyneuropathy: Secondary | ICD-10-CM | POA: Diagnosis not present

## 2020-12-03 DIAGNOSIS — N1831 Chronic kidney disease, stage 3a: Secondary | ICD-10-CM

## 2020-12-03 DIAGNOSIS — E118 Type 2 diabetes mellitus with unspecified complications: Secondary | ICD-10-CM | POA: Diagnosis not present

## 2020-12-03 DIAGNOSIS — Z794 Long term (current) use of insulin: Secondary | ICD-10-CM

## 2020-12-03 DIAGNOSIS — E1122 Type 2 diabetes mellitus with diabetic chronic kidney disease: Secondary | ICD-10-CM | POA: Diagnosis not present

## 2020-12-03 LAB — MICROALBUMIN / CREATININE URINE RATIO
Creatinine,U: 72.2 mg/dL
Microalb Creat Ratio: 13.5 mg/g (ref 0.0–30.0)
Microalb, Ur: 9.7 mg/dL — ABNORMAL HIGH (ref 0.0–1.9)

## 2020-12-03 LAB — POCT GLYCOSYLATED HEMOGLOBIN (HGB A1C): Hemoglobin A1C: 7.4 % — AB (ref 4.0–5.6)

## 2020-12-03 NOTE — Progress Notes (Signed)
Name: Derek Mckenzie  Age/ Sex: 64 y.o., male   MRN/ DOB: [id number], 10-Nov-1956     PCP: Glenda Chroman, MD   Reason for Endocrinology Evaluation: Type 2 Diabetes Mellitus  Initial Endocrine Consultative Visit: 05/16/2019    PATIENT IDENTIFIER: Derek Mckenzie is a 64 y.o. male with a past medical history of HTN, T2DM, cardiomyopathy and dyslipidemia. The patient has followed with Endocrinology clinic since 05/16/2019 for consultative assistance with management of his diabetes.  DIABETIC HISTORY:  Derek Mckenzie was diagnosed with T2DM > 20 yrs ago. He does not recall prior oral glycemic agents, he has been on insulin for years.  His hemoglobin A1c has ranged from 12.2% in 2016, peaking at 13.1% in  2019   On his initial visit to our clinic, his A1c was 12.2% and he was on Novolin- N only. We started him on Novolin- Mix   SUBJECTIVE:   During the last visit (01/31/2020): A1c 7.9 % . We adjusted novolin- Mix (70/30)   Today (12/03/2020): Derek Mckenzie is here for a 6 month follow up on diabetes management, he is accompanied by his roommate [name].  He checks his blood sugars 2 times daily, preprandial to breakfast and supper.  The patient has had hypoglycemic episodes since the last clinic visit, which occurred 2x month  and usually during the day .  He denies sob , nausea or vomiting   HOME DIABETES REGIMEN:  Novolin Mix (70/30) 15 units BID with Breakfast and supper      METER DOWNLOAD SUMMARY: 11/23-12/05/2020 Fingerstick Blood Glucose Tests = 27 Overall Mean FS Glucose = 139 Standard Deviation = 54  BG Ranges: Low = 62 High = 269   Hypoglycemic Events/30 Days: BG < 50 = 0 Episodes of symptomatic severe hypoglycemia = 0        DIABETIC COMPLICATIONS: Microvascular complications:   CKD III  Denies: neuropathy, retinopathy  Last eye exam: Completed 07/11/2020  Macrovascular complications:   CHF, PVD  Denies: CAD, CVA    HISTORY:  Past Medical  History:  Past Medical History:  Diagnosis Date  . CHF (congestive heart failure) ([place])   . Deafness in left ear   . ED (erectile dysfunction)   . Hearing loss in right ear   . Hyperlipidemia   . Hypertension   . PAD (peripheral artery disease) ([place])   . Type II diabetes mellitus ([place])    Past Surgical History:  Past Surgical History:  Procedure Laterality Date  . CATARACT EXTRACTION W/PHACO Left 03/11/2016   Procedure: CATARACT EXTRACTION PHACO AND INTRAOCULAR LENS PLACEMENT (IOC);  Surgeon: Rutherford Guys, MD;  Location: [facility];  Service: Ophthalmology;  Laterality: Left;  CDE:2.71  . CATARACT EXTRACTION W/PHACO Right 05/20/2016   Procedure: CATARACT EXTRACTION PHACO AND INTRAOCULAR LENS PLACEMENT (IOC);  Surgeon: Rutherford Guys, MD;  Location: [facility];  Service: Ophthalmology;  Laterality: Right;  CDE: 4.33  . COLONOSCOPY N/A 01/20/2020   Procedure: COLONOSCOPY;  Surgeon: Danie Binder, MD;  Location: [facility];  Service: Endoscopy;  Laterality: N/A;  8:30am  . POLYPECTOMY  01/20/2020   Procedure: POLYPECTOMY;  Surgeon: Danie Binder, MD;  Location: [facility];  Service: Endoscopy;;  . RETINAL LASER PROCEDURE    . VASECTOMY    . YAG LASER APPLICATION Right 6/96/2952   Procedure: YAG LASER APPLICATION;  Surgeon: Rutherford Guys, MD;  Location: [facility];  Service: Ophthalmology;  Laterality: Right;    Social History:  reports that he has quit smoking.  His smoking use included cigarettes. He has a 17.50 pack-year smoking history. He has never used smokeless tobacco. He reports that he does not drink alcohol and does not use drugs. Family History:  Family History  Problem Relation Age of Onset  . Hypertension Mother   . Stroke Mother   . Heart disease Father   . Other Neg Hx        gynecomastia  . Colon cancer Neg Hx   . Colon polyps Neg Hx      HOME MEDICATIONS: Allergies as of 12/03/2020      Reactions   Atenolol Shortness Of Breath   Amlodipine Besy-benazepril Hcl Other  (See Comments)   Headaches      Medication List       Accurate as of December 03, 2020 10:34 AM. If you have any questions, ask your nurse or doctor.        STOP taking these medications   Dexcom G6 Receiver Devi Stopped by: [name], MD   Dexcom G6 Sensor Misc Stopped by: [name], MD   Dexcom G6 Transmitter Misc Stopped by: [name], MD     TAKE these medications   Alpha-Lipoic Acid 200 MG Caps Take 200 mg by mouth daily.   amLODipine 10 MG tablet Commonly known as: NORVASC TAKE 1 TABLET BY MOUTH ONCE DAILY   aspirin EC 81 MG tablet Take 1 tablet (81 mg total) by mouth daily.   atorvastatin 80 MG tablet Commonly known as: LIPITOR TAKE 1 TABLET BY MOUTH ONCE DAILY.   carvedilol 25 MG tablet Commonly known as: COREG Take 25 mg by mouth 2 (two) times daily with a meal.   Entresto 49-51 MG Generic drug: sacubitril-valsartan Take 1 tablet by mouth 2 (two) times daily.   fenofibrate 145 MG tablet Commonly known as: TRICOR TAKE 1 TABLET BY MOUTH ONCE DAILY.   folic acid 824 MCG tablet Commonly known as: FOLVITE Take 400 mcg by mouth daily.   hydrALAZINE 100 MG tablet Commonly known as: APRESOLINE Take 1 tablet (100 mg total) by mouth 3 (three) times daily.   isosorbide mononitrate 60 MG 24 hr tablet Commonly known as: IMDUR Take 1 tablet (60 mg total) by mouth in the morning and at bedtime.   latanoprost 0.005 % ophthalmic solution Commonly known as: XALATAN Place 1 drop into both eyes at bedtime.   NovoLIN 70/30 (70-30) 100 UNIT/ML injection Generic drug: insulin NPH-regular Human Inject 15 Units into the skin 2 (two) times daily with a meal.   vitamin B-12 1000 MCG tablet Commonly known as: CYANOCOBALAMIN Take 1,000 mcg by mouth daily.        OBJECTIVE:   Vital Signs: BP (!) 160/84   Pulse 92   Ht 5\' 10"  (1.778 m)   Wt 178 lb 2 oz (80.8 kg)   SpO2 97%   BMI 25.56 kg/m   Wt Readings from Last 3  Encounters:  12/03/20 178 lb 2 oz (80.8 kg)  11/15/20 188 lb (85.3 kg)  10/18/20 189 lb 9.6 oz (86 kg)     Exam: General: Pt appears well and is in NAD  Lungs: Clear with good BS bilat with no rales, rhonchi, or wheezes  Heart: RRR with normal S1 and S2 and no gallops; no murmurs; no rub  Extremities: Trace pretibial edema. No tremor.   Neuro: MS is good with appropriate affect, pt is alert and Ox3     DM foot exam: 01/31/2020 The skin of the  feet is intact without sores or ulcerations.Bilateral planter callous formation noted and thickened, discolored toe nails The pedal pulses are undetectable  The sensation is decreased  to a screening 5.07, 10 gram monofilament bilaterally            DATA REVIEWED:  Lab Results  Component Value Date   HGBA1C 7.4 (A) 12/03/2020   HGBA1C 7.9 (A) 07/30/2020   HGBA1C 6.8 (A) 01/31/2020   Lab Results  Component Value Date   LDLCALC 82 10/18/2020   CREATININE 1.75 (H) 10/18/2020    Lab Results  Component Value Date   CHOL 125 10/18/2020   HDL 27 (L) 10/18/2020   LDLCALC 82 10/18/2020   TRIG 79 10/18/2020   CHOLHDL 4.6 10/18/2020          ASSESSMENT / PLAN / RECOMMENDATIONS:   1) Type 2 Diabetes Mellitus, Sub-OPtimally controlled, With CKD III complications and PVD - Most recent A1c of 7.4 %. Goal A1c < 7.0 %.   - A1c down from 7.9%  - Dexcom has been cost prohibitive - Freestyle libre was inaccurate with 40-60 points lower then finger stick - Historically he has been paying cash for his insulin but he is having a new insurance the beginning of the year and would like to run it through his insurance. We discussed switching to insulin pens as they are a safer and easier alternative but he declines and would like to stick with vials.  - Historically unable to add glycemic agents due to concerns about the cost.     MEDICATIONS: - Decrease Novolin Mix (70/30) 13 units with Breakfast and with supper    EDUCATION /  INSTRUCTIONS:  BG monitoring instructions: Patient is instructed to check his blood sugars 2 times a day, before breakfast and supper.  Call [facility] if: BG persistently < 70 . I reviewed the Rule of 15 for the treatment of hypoglycemia in detail with the patient. Literature supplied.  2. HTN:   This is elevated today, he did not take his meds yet. Pt will take them when he gets home   F/U  4 months     Signed electronically by: Mack Guise, MD  [facility] End[facility]  [facility] [address] [place], [place] [address] Phone: [phone] FAX: [phone]   CC: Glenda Chroman, MD [place] [address] Phone: [phone]  Fax: [phone]  Return to Endocrinology clinic as below: Future Appointments  Date Time Provider [place]  12/05/2020  9:40 AM Larey Dresser, MD [facility] None  06/12/2021  9:30 AM Lovelle Deitrick, Melanie Crazier, MD [facility] None

## 2020-12-03 NOTE — Patient Instructions (Signed)
-   Decrease Novolin -Mix  13 units with Breakfast and 13  units with Supper      - HOW TO TREAT LOW BLOOD SUGARS (Blood sugar LESS THAN 70 MG/DL)  Please follow the RULE OF 15 for the treatment of hypoglycemia treatment (when your (blood sugars are less than 70 mg/dL)    STEP 1: Take 15 grams of carbohydrates when your blood sugar is low, which includes:   3-4 GLUCOSE TABS  OR  3-4 OZ OF JUICE OR REGULAR SODA OR  ONE TUBE OF GLUCOSE GEL     STEP 2: RECHECK blood sugar in 15 MINUTES STEP 3: If your blood sugar is still low at the 15 minute recheck --> then, go back to STEP 1 and treat AGAIN with another 15 grams of carbohydrates.

## 2020-12-05 ENCOUNTER — Ambulatory Visit (HOSPITAL_COMMUNITY)
Admission: RE | Admit: 2020-12-05 | Discharge: 2020-12-05 | Disposition: A | Discharge status: Home / Self Care | Payer: PPO | Source: Ambulatory Visit | Attending: Cardiology | Admitting: Cardiology

## 2020-12-05 ENCOUNTER — Other Ambulatory Visit: Payer: Self-pay

## 2020-12-05 ENCOUNTER — Encounter (HOSPITAL_COMMUNITY): Payer: Self-pay | Admitting: Cardiology

## 2020-12-05 VITALS — BP 160/88 | HR 87 | Wt 189.0 lb

## 2020-12-05 DIAGNOSIS — I739 Peripheral vascular disease, unspecified: Secondary | ICD-10-CM

## 2020-12-05 DIAGNOSIS — I5022 Chronic systolic (congestive) heart failure: Secondary | ICD-10-CM

## 2020-12-05 DIAGNOSIS — Z794 Long term (current) use of insulin: Secondary | ICD-10-CM | POA: Diagnosis not present

## 2020-12-05 DIAGNOSIS — E1122 Type 2 diabetes mellitus with diabetic chronic kidney disease: Secondary | ICD-10-CM | POA: Insufficient documentation

## 2020-12-05 DIAGNOSIS — Z87891 Personal history of nicotine dependence: Secondary | ICD-10-CM | POA: Diagnosis not present

## 2020-12-05 DIAGNOSIS — Z7982 Long term (current) use of aspirin: Secondary | ICD-10-CM | POA: Insufficient documentation

## 2020-12-05 DIAGNOSIS — I130 Hypertensive heart and chronic kidney disease with heart failure and stage 1 through stage 4 chronic kidney disease, or unspecified chronic kidney disease: Secondary | ICD-10-CM | POA: Insufficient documentation

## 2020-12-05 DIAGNOSIS — I10 Essential (primary) hypertension: Secondary | ICD-10-CM | POA: Diagnosis not present

## 2020-12-05 DIAGNOSIS — Z8249 Family history of ischemic heart disease and other diseases of the circulatory system: Secondary | ICD-10-CM | POA: Insufficient documentation

## 2020-12-05 DIAGNOSIS — E785 Hyperlipidemia, unspecified: Secondary | ICD-10-CM | POA: Diagnosis not present

## 2020-12-05 DIAGNOSIS — I5042 Chronic combined systolic (congestive) and diastolic (congestive) heart failure: Secondary | ICD-10-CM | POA: Insufficient documentation

## 2020-12-05 DIAGNOSIS — N183 Chronic kidney disease, stage 3 unspecified: Secondary | ICD-10-CM | POA: Insufficient documentation

## 2020-12-05 LAB — BASIC METABOLIC PANEL
Anion gap: 9 (ref 5–15)
BUN: 23 mg/dL (ref 8–23)
CO2: 23 mmol/L (ref 22–32)
Calcium: 8.9 mg/dL (ref 8.9–10.3)
Chloride: 108 mmol/L (ref 98–111)
Creatinine, Ser: 2.05 mg/dL — ABNORMAL HIGH (ref 0.61–1.24)
GFR, Estimated: 35 mL/min — ABNORMAL LOW (ref >60)
Glucose, Bld: 193 mg/dL — ABNORMAL HIGH (ref 70–99)
Potassium: 4.4 mmol/L (ref 3.5–5.1)
Sodium: 140 mmol/L (ref 135–145)

## 2020-12-05 MED ORDER — SPIRONOLACTONE 25 MG PO TABS: 12.5000 mg | ORAL_TABLET | Freq: Every day | ORAL | 3 refills | Status: DC

## 2020-12-05 MED ORDER — EZETIMIBE 10 MG PO TABS: 10.0000 mg | ORAL_TABLET | Freq: Every day | ORAL | 3 refills | Status: DC

## 2020-12-05 NOTE — Progress Notes (Signed)
Patient ID: Derek Mckenzie, male   DOB: [birth year], 64 y.o.   MRN: [id number] PCP: Dr. Woody Seller HF Cardiology: Dr. Aundra Dubin  64 y.o. with history of HTN, DM, PAD, CKD and systolic CHF presents for CHF clinic followup.  He has had a long history of difficult-to-control HTN.  Medications were adjusted and repeat echo in 12/17 showed improvement in EF to 60-65%.  He has known PAD, significant on 12/17 peripheral arterial dopplers.  He saw Dr. Gwenlyn Found for evaluation and conservative treatment for now was recommended.  Echo in 12/18 showed EF 60-65%, moderate LVH.  Echo in 11/20 with EF 60-65%, moderate LVH, normal RV.   Patient returns for followup of CHF and HTN. Very hard of hearing.  Weight is stable.  BP remains elevated.  He denies significant exertional dyspnea or chest pain.  No claudication, no pedal ulcerations.  No orthopnea/PND.  No lightheadedness or palpitations.    Labs (11/16): LDL 109, HDL 26, K 4.1, creatinine 1.42, BNP 197, TSH normal Labs (3/17): K 4.2, creatinine 1.73 Labs (12/17): K 5.4, creatinine 1.5, TGs 448, unable to calculate LDL, BNP 51, hgb 12.4 Labs (1/18): K 5.1, creatinine 1.62 Labs (7/18): LDL 84, HDL 21, TGs 215 Labs (8/18): K 5.1, creatinine 1.84 Labs (10/18): LDL 64, HDL 22, LFTs normal Labs (12/18): K 4.8, creatinine 1.85 Labs (1/20): K 5.1, creatinine 1.82 Labs (10/21): LDL 82, HDL 27, K 4.1, creatinine 1.75, LFTs normal  PMH:  1. Type II diabetes. 2. HTN: x years, poorly controlled. 11/16 renal artery dopplers with no significant stenosis.  3. Deafness 4. PAD: 2012 peripheral arterial dopplers with right distal SFA stenosis and left mid SFA stenosis.  - Peripheral arterial dopplers (12/17): Probable right iliac obstruction and distal left SFA obstruction.  - ABIs (12/18): 0.61 right, 0.9 left (improved).  - ABIs (1/20): 0.62 right, 0.85 left 5. Sleep study negative for OSA 6. Chronic systolic CHF: Echo (68/12) with EF 30-35%, moderate LVH.  Possible  hypertensive cardiomyopathy.  - Echo (12/17): EF 60-65%.  - Echo (12/18): EF 60-65%, moderate LVH.  - Echo (11/20): EF 60-65%, moderate LVH, normal RV.  7. CKD: Stage 3.  8. Hyperlipidemia  FH: No cardiac disease that he knows of.  +HTN.   Social History   Socioeconomic History  . Marital status: Single    Spouse name: Not on file  . Number of children: Not on file  . Years of education: Not on file  . Highest education level: Not on file  Occupational History  . Not on file  Tobacco Use  . Smoking status: Former Smoker    Packs/day: 0.50    Years: 35.00    Pack years: 17.50    Types: Cigarettes  . Smokeless tobacco: Never Used  . Tobacco comment: "quit smoking in ~ 2007"  Substance and Sexual Activity  . Alcohol use: No  . Drug use: No  . Sexual activity: Never  Other Topics Concern  . Not on file  Social History Narrative   IN A LONG TERM PLATONIC RELATIONSHIP. BEEN GOOD FRIENDS FOR YEARS AND TAKING CARE OF EACH OTHER. SEES KIDS VERY RARELY. 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 HEARING AID DID NOT WORK.   Social Determinants of Health   Financial Resource Strain:   . Difficulty of Paying Living Expenses: Not on file  Food Insecurity:   . Worried About Charity fundraiser in the Last Year: Not on file  . Ran Out of  Food in the Last Year: Not on file  Transportation Needs:   . Lack of Transportation (Medical): Not on file  . Lack of Transportation (Non-Medical): Not on file  Physical Activity:   . Days of Exercise per Week: Not on file  . Minutes of Exercise per Session: Not on file  Stress:   . Feeling of Stress : Not on file  Social Connections:   . Frequency of Communication with Friends and Family: Not on file  . Frequency of Social Gatherings with Friends and Family: Not on file  . Attends Religious Services: Not on file  . Active Member of Clubs or Organizations: Not on file  . Attends Archivist Meetings: Not on file  .  Marital Status: Not on file  Intimate Partner Violence:   . Fear of Current or Ex-Partner: Not on file  . Emotionally Abused: Not on file  . Physically Abused: Not on file  . Sexually Abused: Not on file   ROS: All systems reviewed and negative except as per HPI.   Current Outpatient Medications  Medication Sig Dispense Refill  . Alpha-Lipoic Acid 200 MG CAPS Take 200 mg by mouth daily.     Marland Kitchen amLODipine (NORVASC) 10 MG tablet TAKE 1 TABLET BY MOUTH ONCE DAILY 30 tablet 3  . aspirin EC 81 MG tablet Take 1 tablet (81 mg total) by mouth daily. 30 tablet 3  . atorvastatin (LIPITOR) 80 MG tablet TAKE 1 TABLET BY MOUTH ONCE DAILY. 90 tablet 3  . carvedilol (COREG) 25 MG tablet Take 25 mg by mouth 2 (two) times daily with a meal.    . fenofibrate (TRICOR) 145 MG tablet TAKE 1 TABLET BY MOUTH ONCE DAILY. 90 tablet 3  . folic acid (FOLVITE) 993 MCG tablet Take 400 mcg by mouth daily.     . hydrALAZINE (APRESOLINE) 100 MG tablet Take 1 tablet (100 mg total) by mouth 3 (three) times daily. 270 tablet 3  . insulin NPH-regular Human (70-30) 100 UNIT/ML injection Inject 14 Units into the skin in the morning and at bedtime.    . isosorbide mononitrate (IMDUR) 60 MG 24 hr tablet Take 1 tablet (60 mg total) by mouth in the morning and at bedtime. 60 tablet 6  . latanoprost (XALATAN) 0.005 % ophthalmic solution Place 1 drop into both eyes at bedtime.    . sacubitril-valsartan (ENTRESTO) 49-51 MG Take 1 tablet by mouth 2 (two) times daily. 180 tablet 3  . vitamin B-12 (CYANOCOBALAMIN) 1000 MCG tablet Take 1,000 mcg by mouth daily.     Marland Kitchen ezetimibe (ZETIA) 10 MG tablet Take 1 tablet (10 mg total) by mouth daily. 90 tablet 3  . spironolactone (ALDACTONE) 25 MG tablet Take 0.5 tablets (12.5 mg total) by mouth daily. 45 tablet 3   No current facility-administered medications for this encounter.   BP (!) 160/88   Pulse 87   Wt 85.7 kg (189 lb)   SpO2 96%   BMI 27.12 kg/m  General: NAD Neck: No JVD, no  thyromegaly or thyroid nodule.  Lungs: Clear to auscultation bilaterally with normal respiratory effort. CV: Nondisplaced PMI.  Heart regular S1/S2, no S3/S4, no murmur.  No peripheral edema.  No carotid bruit.  Unable to palpate pedal pulses.  Abdomen: Soft, nontender, no hepatosplenomegaly, no distention.  Skin: Intact without lesions or rashes.  Neurologic: Alert and oriented x 3.  Psych: Normal affect. Extremities: No clubbing or cyanosis.  HEENT: Normal.   Assessment/Plan: 1. Chronic systolic => diastolic  CHF: Echo 11/16 with EF 30-35%.  Given improvement in EF to 60-65% with BP control, suspect this was a hypertensive cardiomyopathy. Has moderate LVH on echo.  NYHA class II, not volume overloaded on exam.  - Continue, Entresto, [name], and Coreg.  BMET today.  - He has been off spironolactone due to hyperkalemia.  2. HTN: Renal arterial dopplers in 11/16 did not show evidence for renal artery stenosis. BP still elevated, moderate LVH on last echo in 2020.  He was unable to tolerate Entresto 97/103 bid due to mucle cramps and diarrhea.  - Will try again to get him on spironolactone, use low dose 12.5 mg daily.  BMET today and again in 1 week.  3. PAD: No claudication.  ABIs abnormal in 1/20 but stable.     - Continue ASA 81 and statin.   - I will obtain repeat peripheral arterial dopplers to assess for progression.    4. Hyperlipidemia: Recent lipids with LDL > 70, will add Zetia 10 mg daily with repeat lipids/LFTs in 2 months.   5. CKD: Stage 3.  BMET today.   Followup in HF pharmacy clinic in 1 month for med titration for BP control, see APP in 2 months.   Loralie Champagne 12/05/2020

## 2020-12-05 NOTE — Patient Instructions (Signed)
START Zetia 10mg  (1 tab) dailu  START Spironolactone 12.5mg  (1/2 tab) daily  Labs today and repeat in 1 week We will only contact you if something comes back abnormal or we need to make some changes. Otherwise no news is good news!  You have been ordered for an ultrasound of your legs.  You will get a call to schedule this appointment.   Your physician recommends that you schedule a follow-up appointment in: 1 month with the Pharmacist and 2 months with Nurse Practitioner/Physicians Assistant.   Please call office at [phone] option 2 if you have any questions or concerns.   At the [facility], you and your health needs are our priority. As part of our continuing mission to provide you with exceptional heart care, we have created designated Provider Care Teams. These Care Teams include your primary Cardiologist (physician) and Advanced Practice Providers (APPs- Physician Assistants and Nurse Practitioners) who all work together to provide you with the care you need, when you need it.   You may see any of the following providers on your designated Care Team at your next follow up: Marland Kitchen Dr Glori Bickers . Dr Loralie Champagne . Darrick Grinder, NP . Lyda Jester, PA . Audry Riles, PharmD   Please be sure to bring in all your medications bottles to every appointment.

## 2020-12-12 ENCOUNTER — Ambulatory Visit (HOSPITAL_COMMUNITY)
Admission: RE | Admit: 2020-12-12 | Discharge: 2020-12-12 | Disposition: A | Discharge status: Home / Self Care | Payer: PPO | Source: Ambulatory Visit | Attending: Internal Medicine | Admitting: Internal Medicine

## 2020-12-12 ENCOUNTER — Other Ambulatory Visit: Payer: Self-pay

## 2020-12-12 DIAGNOSIS — I5022 Chronic systolic (congestive) heart failure: Secondary | ICD-10-CM | POA: Insufficient documentation

## 2020-12-12 LAB — BASIC METABOLIC PANEL
Anion gap: 7 (ref 5–15)
BUN: 23 mg/dL (ref 8–23)
CO2: 23 mmol/L (ref 22–32)
Calcium: 8.9 mg/dL (ref 8.9–10.3)
Chloride: 110 mmol/L (ref 98–111)
Creatinine, Ser: 1.93 mg/dL — ABNORMAL HIGH (ref 0.61–1.24)
GFR, Estimated: 38 mL/min — ABNORMAL LOW (ref >60)
Glucose, Bld: 160 mg/dL — ABNORMAL HIGH (ref 70–99)
Potassium: 4.3 mmol/L (ref 3.5–5.1)
Sodium: 140 mmol/L (ref 135–145)

## 2020-12-20 ENCOUNTER — Other Ambulatory Visit: Payer: Self-pay | Admitting: Cardiology

## 2020-12-20 ENCOUNTER — Ambulatory Visit (INDEPENDENT_AMBULATORY_CARE_PROVIDER_SITE_OTHER): Payer: PPO

## 2020-12-20 ENCOUNTER — Telehealth (HOSPITAL_COMMUNITY): Payer: Self-pay | Admitting: Pharmacy Technician

## 2020-12-20 DIAGNOSIS — I739 Peripheral vascular disease, unspecified: Secondary | ICD-10-CM | POA: Diagnosis not present

## 2020-12-20 NOTE — Telephone Encounter (Signed)
Sent in [employer] application via fax.  Will follow up.  

## 2020-12-24 NOTE — Progress Notes (Signed)
PCP: Dr. Sherril Croon HF Cardiology: Dr. Shirlee Latch  HPI:  64 y.o. with history of HTN, DM, PAD, CKD and systolic CHF presents for CHF clinic followup.  He has had a long history of difficult-to-control HTN.  Medications were adjusted and repeat echo in 12/17 showed improvement in EF to 60-65%.  He has known PAD which was significant on 12/17 peripheral arterial dopplers.  He saw Dr. Allyson Sabal for evaluation and conservative treatment was recommended.  Echo in 12/18 showed EF 60-65% and moderate LVH. Echo in 11/20 showed EF 60-65%, RV normal.    Patient recently returned to HF Clinic for followup of CHF and HTN. Very hard of hearing.  Weight was stable.  BP remained elevated.  He denied significant exertional dyspnea or chest pain.  No claudication, no pedal ulcerations.  No orthopnea/PND.  No lightheadedness or palpitations.  Today he returns to HF clinic for pharmacist medication titration. At last visit with MD, spironolactone 12.5 mg daily was initiated. Additionally, ezetimibe 10 mg daily was initiated. Overall feeling well today. No dizziness, lightheadedness, chest pain or palpitations. No SOB/DOE. No LEE, PND or orthopnea. BP elevated in clinic at 166/72. Has not been checking BP at home.    HF Medications: Carvedilol 25 mg BID Entresto 49/51 mg BID Spironolactone 12.5 mg daily Hydralazine 100 mg TID Isosorbide mononitrate 60 mg BID  Has the patient been experiencing any side effects to the medications prescribed?  No - GI side effects resolved after decreasing Entresto dose to 49/51 mg BID  Does the patient have any problems obtaining medications due to transportation or finances?   Not since changing [name] to separate components - has [employer] Medicare. Tammy Sours from [employer].  Understanding of regimen: good Understanding of indications: good Potential of compliance: good Patient understands to avoid NSAIDs. Patient understands to avoid decongestants.    Pertinent Lab  Values (12/12/2020): Marland Kitchen Serum creatinine 1.93, BUN 23, Potassium 4.3, Sodium 140   Vital Signs: . Weight: 188.4 lbs (last clinic weight: 189 lbs) . Blood pressure: 166/72  . Heart rate: 87   Assessment: 1. Chronic systolic CHF: Echo 11/16 with EF 30-35%.  Given improvement in EF to 60-65% in 12/17 and 12/18 with BP control, suspect this was a hypertensive cardiomyopathy.  EF stable on repeat echo 11/20, 60-65%, mod LVH, RV normal.  - NYHA Class II, Volume status stable on exam  - Increase carvedilol to 37.5 mg BID for 1 week, then increase to 50 mg BID.  - Continue Entresto 49/51 mg BID. Unable to tolerate higher dose due to diarrhea/muscle cramps. - Continue spironolactone 12.5 mg daily. Previously did not tolerate due to hyperkalemia.  - Continue hydralazine 100 mg TID - Continue isosorbide mononitrate 60 mg BID    2. HTN: Renal arterial dopplers in 11/16 did not show evidence for renal artery stenosis. Sleep study negative for OSA. BP elevated today 166/72.  - Continue amlodipine 10 mg daily - Increase Carvedilol as above - Continue hydralazine, Entresto, and isosorbide mononitrate as above  3. PAD: denies claudication.  ABIs abnormal in 1/20 but stable.  - Continue ASA 81 mg daily - Continue atorvastatin 80 mg daily    4. Hyperlipidemia: Recent lipids with LDL > 70, continue Zetia 10 mg daily with repeat lipids/LFTs in 2 months.    5. CKD: Stage 3.    6. T2DM  - Most recent hgb A1c 7.9 - On insulin, managed by PCP    Plan: 1) Medication changes: Based on clinical presentation, vital  signs and recent labs will increase carvedilol to 37.5 mg BID for 1 week, then increase to 50 mg BID.  2) Follow-up: 1 month with APP Clinic    Audry Riles, PharmD, BCPS, BCCP, CPP Heart Failure Clinic Pharmacist [phone]

## 2021-01-08 ENCOUNTER — Ambulatory Visit (HOSPITAL_COMMUNITY)
Admission: RE | Admit: 2021-01-08 | Discharge: 2021-01-08 | Disposition: A | Discharge status: Home / Self Care | Payer: HMO | Source: Ambulatory Visit | Attending: Cardiology | Admitting: Cardiology

## 2021-01-08 ENCOUNTER — Other Ambulatory Visit: Payer: Self-pay

## 2021-01-08 VITALS — BP 166/72 | HR 87 | Wt 188.4 lb

## 2021-01-08 DIAGNOSIS — E1151 Type 2 diabetes mellitus with diabetic peripheral angiopathy without gangrene: Secondary | ICD-10-CM | POA: Insufficient documentation

## 2021-01-08 DIAGNOSIS — I130 Hypertensive heart and chronic kidney disease with heart failure and stage 1 through stage 4 chronic kidney disease, or unspecified chronic kidney disease: Secondary | ICD-10-CM | POA: Diagnosis not present

## 2021-01-08 DIAGNOSIS — E785 Hyperlipidemia, unspecified: Secondary | ICD-10-CM | POA: Insufficient documentation

## 2021-01-08 DIAGNOSIS — I5022 Chronic systolic (congestive) heart failure: Secondary | ICD-10-CM | POA: Diagnosis not present

## 2021-01-08 DIAGNOSIS — Z79899 Other long term (current) drug therapy: Secondary | ICD-10-CM | POA: Insufficient documentation

## 2021-01-08 DIAGNOSIS — N183 Chronic kidney disease, stage 3 unspecified: Secondary | ICD-10-CM | POA: Insufficient documentation

## 2021-01-08 DIAGNOSIS — E1122 Type 2 diabetes mellitus with diabetic chronic kidney disease: Secondary | ICD-10-CM | POA: Diagnosis not present

## 2021-01-08 MED ORDER — CARVEDILOL 25 MG PO TABS
50.0000 mg | ORAL_TABLET | Freq: Two times a day (BID) | ORAL | 11 refills | Status: DC
Start: 2021-01-08 — End: 2022-02-18

## 2021-01-08 NOTE — Patient Instructions (Signed)
It was a pleasure seeing you today!  MEDICATIONS: -We are changing your medications today -Increase carvedilol to 37.5 mg (1.5 tablets) twice daily for 1 week. Then increase carvedilol to 50 mg (2 tablets) twice daily.  -Call if you have questions about your medications.  NEXT APPOINTMENT: Return to clinic in 1 month with [facility] Clinic.  In general, to take care of your heart failure: -Limit your fluid intake to 2 Liters (half-gallon) per day.   -Limit your salt intake to ideally 2-3 grams (2000-3000 mg) per day. -Weigh yourself daily and record, and bring that "weight diary" to your next appointment.  (Weight gain of 2-3 pounds in 1 day typically means fluid weight.) -The medications for your heart are to help your heart and help you live longer.   -Please contact us before stopping any of your heart medications.  Call the clinic at [phone] with questions or to reschedule future appointments.

## 2021-01-17 ENCOUNTER — Telehealth (HOSPITAL_COMMUNITY): Payer: Self-pay | Admitting: Pharmacist

## 2021-01-17 NOTE — Telephone Encounter (Signed)
Advanced Heart Failure Patient Advocate Encounter   Patient was approved to receive Entresto from [employer].   Patient ID: [id number] Effective dates: 01/17/21 through 12/28/21  Audry Riles, PharmD, BCPS, BCCP, CPP Heart Failure Clinic Pharmacist [phone]

## 2021-01-21 ENCOUNTER — Telehealth: Payer: Self-pay | Admitting: Internal Medicine

## 2021-01-21 MED ORDER — INSULIN NPH ISOPHANE & REGULAR (70-30) 100 UNIT/ML ~~LOC~~ SUSP: 13.0000 [IU] | Freq: Two times a day (BID) | SUBCUTANEOUS | 2 refills | Status: DC

## 2021-01-21 NOTE — Telephone Encounter (Signed)
Rx sent with new direction from [facility] on 12/03/2020

## 2021-01-21 NOTE — Telephone Encounter (Signed)
RX REQUEST:  novolog 70/30 (2 vials)  PHARMACY:  [facility] [address] [place] Phone:  [phone]  Fax:  [phone]

## 2021-01-29 DIAGNOSIS — E1142 Type 2 diabetes mellitus with diabetic polyneuropathy: Secondary | ICD-10-CM | POA: Diagnosis not present

## 2021-01-29 DIAGNOSIS — B351 Tinea unguium: Secondary | ICD-10-CM | POA: Diagnosis not present

## 2021-01-29 DIAGNOSIS — M79676 Pain in unspecified toe(s): Secondary | ICD-10-CM | POA: Diagnosis not present

## 2021-01-29 DIAGNOSIS — L84 Corns and callosities: Secondary | ICD-10-CM | POA: Diagnosis not present

## 2021-02-04 NOTE — Progress Notes (Signed)
Patient ID: Derek Mckenzie, male   DOB: Apr 09, 1956, 65 y.o.   MRN: [id number]   PCP: Dr. Woody Seller HF Cardiology: Dr. Aundra Dubin  65 y.o. with history of HTN, DM, PAD, CKD and systolic CHF presents for CHF clinic followup.  He has had a long history of difficult-to-control HTN.  Medications were adjusted and repeat echo in 12/17 showed improvement in EF to 60-65%.  He has known PAD, significant on 12/17 peripheral arterial dopplers.  He saw Dr. Gwenlyn Found for evaluation and conservative treatment for now was recommended.  Echo in 12/18 showed EF 60-65%, moderate LVH.  Echo in 11/20 with EF 60-65%, moderate LVH, normal RV.   Patient returned for followup of CHF and HTN 12/21. Very hard of hearing.  Weight is stable.  BP remains elevated.  He denies significant exertional dyspnea or chest pain.  No claudication, no pedal ulcerations.  No orthopnea/PND.  No lightheadedness or palpitations.  Repeat peripheral arterial dopplers ordered.  Today he returns for HF follow up, here with his wife. Overall feeling fine. SOB if he had to run, tired if walking upstairs. Denies increasing SOB, CP, dizziness, edema, or PND/Orthopnea. Appetite ok. No fever or chills. Weight at home ~179-184 pounds. Taking all medications. Does not check BP at home.  Labs (11/16): LDL 109, HDL 26, K 4.1, creatinine 1.42, BNP 197, TSH normal Labs (3/17): K 4.2, creatinine 1.73 Labs (12/17): K 5.4, creatinine 1.5, TGs 448, unable to calculate LDL, BNP 51, hgb 12.4 Labs (1/18): K 5.1, creatinine 1.62 Labs (7/18): LDL 84, HDL 21, TGs 215 Labs (8/18): K 5.1, creatinine 1.84 Labs (10/18): LDL 64, HDL 22, LFTs normal Labs (12/18): K 4.8, creatinine 1.85 Labs (1/20): K 5.1, creatinine 1.82 Labs (10/21): LDL 82, HDL 27, K 4.1, creatinine 1.75, LFTs normal Labs (12/21): K 4.3, creatinine 1.93, hgb a1c 7.4  PMH:  1. Type II diabetes. 2. HTN: x years, poorly controlled. 11/16 renal artery dopplers with no significant stenosis.  3. Deafness 4. PAD:  2012 peripheral arterial dopplers with right distal SFA stenosis and left mid SFA stenosis.  - Peripheral arterial dopplers (12/17): Probable right iliac obstruction and distal left SFA obstruction.  - ABIs (12/18): 0.61 right, 0.9 left (improved).  - ABIs (1/20): 0.62 right, 0.85 left 5. Sleep study negative for OSA 6. Chronic systolic CHF: Echo (56/81) with EF 30-35%, moderate LVH.  Possible hypertensive cardiomyopathy.  - Echo (12/17): EF 60-65%.  - Echo (12/18): EF 60-65%, moderate LVH.  - Echo (11/20): EF 60-65%, moderate LVH, normal RV.  7. CKD: Stage 3.  8. Hyperlipidemia  FH: No cardiac disease that he knows of.  +HTN.   Social History   Socioeconomic History  . Marital status: Single    Spouse name: Not on file  . Number of children: Not on file  . Years of education: Not on file  . Highest education level: Not on file  Occupational History  . Not on file  Tobacco Use  . Smoking status: Former Smoker    Packs/day: 0.50    Years: 35.00    Pack years: 17.50    Types: Cigarettes  . Smokeless tobacco: Never Used  . Tobacco comment: "quit smoking in ~ 2007"  Substance and Sexual Activity  . Alcohol use: No  . Drug use: No  . Sexual activity: Never  Other Topics Concern  . Not on file  Social History Narrative   IN A LONG TERM PLATONIC RELATIONSHIP. BEEN GOOD FRIENDS FOR YEARS AND TAKING CARE OF  EACH OTHER. SEES KIDS VERY RARELY. 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 HEARING AID DID NOT WORK.   Social Determinants of Health   Financial Resource Strain: Not on file  Food Insecurity: Not on file  Transportation Needs: Not on file  Physical Activity: Not on file  Stress: Not on file  Social Connections: Not on file  Intimate Partner Violence: Not on file   ROS: All systems reviewed and negative except as per HPI.   Current Outpatient Medications  Medication Sig Dispense Refill  . Alpha-Lipoic Acid 200 MG CAPS Take 200 mg by mouth daily.      Marland Kitchen amLODipine (NORVASC) 10 MG tablet TAKE 1 TABLET BY MOUTH ONCE DAILY 30 tablet 3  . aspirin EC 81 MG tablet Take 1 tablet (81 mg total) by mouth daily. 30 tablet 3  . atorvastatin (LIPITOR) 80 MG tablet TAKE 1 TABLET BY MOUTH ONCE DAILY. 90 tablet 3  . carvedilol (COREG) 25 MG tablet Take 2 tablets (50 mg total) by mouth 2 (two) times daily with a meal. 120 tablet 11  . ezetimibe (ZETIA) 10 MG tablet Take 1 tablet (10 mg total) by mouth daily. 90 tablet 3  . fenofibrate (TRICOR) 145 MG tablet TAKE 1 TABLET BY MOUTH ONCE DAILY. 90 tablet 3  . folic acid (FOLVITE) 086 MCG tablet Take 400 mcg by mouth daily.     . hydrALAZINE (APRESOLINE) 100 MG tablet Take 1 tablet (100 mg total) by mouth 3 (three) times daily. 270 tablet 3  . insulin NPH-regular Human (70-30) 100 UNIT/ML injection Inject 13 Units into the skin in the morning and at bedtime. 10 mL 2  . isosorbide mononitrate (IMDUR) 60 MG 24 hr tablet Take 1 tablet (60 mg total) by mouth in the morning and at bedtime. 60 tablet 6  . latanoprost (XALATAN) 0.005 % ophthalmic solution Place 1 drop into both eyes at bedtime.    . sacubitril-valsartan (ENTRESTO) 49-51 MG Take 1 tablet by mouth 2 (two) times daily. 180 tablet 3  . spironolactone (ALDACTONE) 25 MG tablet Take 0.5 tablets (12.5 mg total) by mouth daily. 45 tablet 3  . vitamin B-12 (CYANOCOBALAMIN) 1000 MCG tablet Take 1,000 mcg by mouth daily.      No current facility-administered medications for this encounter.   Wt Readings from Last 3 Encounters:  02/05/21 84.8 kg (187 lb)  01/08/21 85.5 kg (188 lb 6.4 oz)  12/05/20 85.7 kg (189 lb)    BP 128/83   Pulse 86   Wt 84.8 kg (187 lb)   SpO2 98%   BMI 26.83 kg/m  General:  NAD. No resp difficulty HEENT: HOH, bilateral hearing aids in place Neck: Supple. No JVD. Carotids 2+ bilat; no bruits. No lymphadenopathy or thryomegaly appreciated. Cor: PMI nondisplaced. Regular rate & rhythm. No rubs, gallops or murmurs. Lungs:  Clear Abdomen: Obese, soft, nontender, nondistended. No hepatosplenomegaly. No bruits or masses. Good bowel sounds. Extremities: No cyanosis, clubbing, rash, edema Neuro: alert & oriented x 3, cranial nerves grossly intact. Moves all 4 extremities w/o difficulty. Affect pleasant.  Assessment/Plan: 1. Chronic systolic => diastolic CHF: Echo 57/84 with EF 30-35%.  Given improvement in EF to 60-65% with BP control, suspect this was a hypertensive cardiomyopathy. Has moderate LVH on echo. NYHA class II, not volume overloaded on exam.  - Continue Entresto 49/51 mg bid. (Unable to tolerate 97/103 due to muscle cramps & diarrhea). - Continue hydralazine 100 mg tid+ Imdur 60 mg daily. -  Continue Coreg 50 mg bid.   - He is now back on spironolactone 12.5 mg daily, BMET today (has had h/o hyperkalemia) 2. HTN: Renal arterial dopplers in 11/16 did not show evidence for renal artery stenosis. Moderate LVH on last echo in 2020. BP much improved today. - Continue amlodipine, hydralazine, Imdur, coreg, spiro and Entresto.  3. PAD: No claudication.  ABIs abnormal in 1/20 but stable.     - Continue ASA 81 and statin.   - Repeat peripheral arterial dopplers (12/21) moderate right PAD, no change from prior. Repeat in 1 year (12/22). 4. Hyperlipidemia: Recent lipids with LDL > 70, continue Zetia 10 mg daily, will repeat lipids/LFTs today. 5. CKD IIIb: Stage 3.  BMET today.   Followup in 4-6 weeks with APP (will re-check BMET with recent addition of spiro), 3-4 months with Dr. Aundra Dubin, consider timing of repeat Echo.  [place], FNP-BC 02/05/2021

## 2021-02-05 ENCOUNTER — Telehealth (HOSPITAL_COMMUNITY): Payer: Self-pay | Admitting: Cardiology

## 2021-02-05 ENCOUNTER — Ambulatory Visit (HOSPITAL_COMMUNITY)
Admission: RE | Admit: 2021-02-05 | Discharge: 2021-02-05 | Disposition: A | Discharge status: Home / Self Care | Payer: HMO | Source: Ambulatory Visit | Attending: Family Medicine | Admitting: Family Medicine

## 2021-02-05 ENCOUNTER — Other Ambulatory Visit: Payer: Self-pay

## 2021-02-05 ENCOUNTER — Encounter (HOSPITAL_COMMUNITY): Payer: Self-pay

## 2021-02-05 VITALS — BP 128/83 | HR 86 | Wt 187.0 lb

## 2021-02-05 DIAGNOSIS — I739 Peripheral vascular disease, unspecified: Secondary | ICD-10-CM

## 2021-02-05 DIAGNOSIS — I5042 Chronic combined systolic (congestive) and diastolic (congestive) heart failure: Secondary | ICD-10-CM | POA: Diagnosis not present

## 2021-02-05 DIAGNOSIS — E1122 Type 2 diabetes mellitus with diabetic chronic kidney disease: Secondary | ICD-10-CM | POA: Diagnosis not present

## 2021-02-05 DIAGNOSIS — E785 Hyperlipidemia, unspecified: Secondary | ICD-10-CM | POA: Diagnosis not present

## 2021-02-05 DIAGNOSIS — I130 Hypertensive heart and chronic kidney disease with heart failure and stage 1 through stage 4 chronic kidney disease, or unspecified chronic kidney disease: Secondary | ICD-10-CM | POA: Diagnosis not present

## 2021-02-05 DIAGNOSIS — I5022 Chronic systolic (congestive) heart failure: Secondary | ICD-10-CM | POA: Diagnosis not present

## 2021-02-05 DIAGNOSIS — Z7982 Long term (current) use of aspirin: Secondary | ICD-10-CM | POA: Diagnosis not present

## 2021-02-05 DIAGNOSIS — I10 Essential (primary) hypertension: Secondary | ICD-10-CM | POA: Diagnosis not present

## 2021-02-05 DIAGNOSIS — Z87891 Personal history of nicotine dependence: Secondary | ICD-10-CM | POA: Insufficient documentation

## 2021-02-05 DIAGNOSIS — R0602 Shortness of breath: Secondary | ICD-10-CM | POA: Diagnosis not present

## 2021-02-05 DIAGNOSIS — Z79899 Other long term (current) drug therapy: Secondary | ICD-10-CM | POA: Diagnosis not present

## 2021-02-05 DIAGNOSIS — N1832 Chronic kidney disease, stage 3b: Secondary | ICD-10-CM | POA: Insufficient documentation

## 2021-02-05 DIAGNOSIS — Z7901 Long term (current) use of anticoagulants: Secondary | ICD-10-CM | POA: Insufficient documentation

## 2021-02-05 DIAGNOSIS — Z794 Long term (current) use of insulin: Secondary | ICD-10-CM | POA: Insufficient documentation

## 2021-02-05 LAB — COMPREHENSIVE METABOLIC PANEL
ALT: 18 U/L (ref 0–44)
AST: 15 U/L (ref 15–41)
Albumin: 3.5 g/dL (ref 3.5–5.0)
Alkaline Phosphatase: 57 U/L (ref 38–126)
Anion gap: 7 (ref 5–15)
BUN: 28 mg/dL — ABNORMAL HIGH (ref 8–23)
CO2: 24 mmol/L (ref 22–32)
Calcium: 9.1 mg/dL (ref 8.9–10.3)
Chloride: 109 mmol/L (ref 98–111)
Creatinine, Ser: 2.23 mg/dL — ABNORMAL HIGH (ref 0.61–1.24)
GFR, Estimated: 32 mL/min — ABNORMAL LOW (ref >60)
Glucose, Bld: 171 mg/dL — ABNORMAL HIGH (ref 70–99)
Potassium: 4.7 mmol/L (ref 3.5–5.1)
Sodium: 140 mmol/L (ref 135–145)
Total Bilirubin: 0.5 mg/dL (ref 0.3–1.2)
Total Protein: 7 g/dL (ref 6.5–8.1)

## 2021-02-05 LAB — LIPID PANEL
Cholesterol: 98 mg/dL (ref 0–200)
HDL: 24 mg/dL — ABNORMAL LOW (ref >40)
LDL Cholesterol: 53 mg/dL (ref 0–99)
Total CHOL/HDL Ratio: 4.1 RATIO
Triglycerides: 103 mg/dL (ref <150)
VLDL: 21 mg/dL (ref 0–40)

## 2021-02-05 NOTE — Patient Instructions (Signed)
It was great to see you today! No medication changes are needed at this time.  Labs today We will only contact you if something comes back abnormal or we need to make some changes. Otherwise no news is good news!  Your physician recommends that you schedule a follow-up appointment in: 4-6 weeks  in the [facility] Practition[facility] (PA/NP) Clinic   Your physician recommends that you schedule a follow-up appointment in: 3-4 months with Dr Aundra Dubin  Do the following things EVERYDAY: 1) Weigh yourself in the morning before breakfast. Write it down and keep it in a log. 2) Take your medicines as prescribed 3) Eat low salt foods-Limit salt (sodium) to 2000 mg per day.  4) Stay as active as you can everyday 5) Limit all fluids for the day to less than 2 liters  At the [facility], you and your health needs are our priority. As part of our continuing mission to provide you with exceptional heart care, we have created designated Provider Care Teams. These Care Teams include your primary Cardiologist (physician) and Advanced Practice Providers (APPs- Physician Assistants and Nurse Practitioners) who all work together to provide you with the care you need, when you need it.   You may see any of the following providers on your designated Care Team at your next follow up: Marland Kitchen Dr Glori Bickers . Dr Loralie Champagne . Darrick Grinder, NP . Lyda Jester, [place] . Audry Riles, PharmD   Please be sure to bring in all your medications bottles to every appointment.    If you have any questions or concerns before your next appointment please send [place] a message through Media or call our office at [phone].    TO LEAVE A MESSAGE FOR THE NURSE SELECT OPTION 2, PLEASE LEAVE A MESSAGE INCLUDING: . YOUR NAME . DATE OF BIRTH . CALL BACK NUMBER . REASON FOR CALL**this is important as we prioritize the call backs  YOU WILL RECEIVE A CALL BACK THE SAME DAY AS LONG AS YOU CALL  BEFORE 4:00 PM

## 2021-02-05 NOTE — Telephone Encounter (Signed)
-----   Message from Rafael Bihari, [place] sent at 02/05/2021 12:27 PM EST ----- Kidney function slightly elevated, lipids stable., LDL 53 (goal <70). Will recheck BMET 7-10 days.

## 2021-02-05 NOTE — Telephone Encounter (Signed)
Pt aware via wife Repeat labs 2/17

## 2021-02-07 DIAGNOSIS — Z299 Encounter for prophylactic measures, unspecified: Secondary | ICD-10-CM | POA: Diagnosis not present

## 2021-02-07 DIAGNOSIS — E1122 Type 2 diabetes mellitus with diabetic chronic kidney disease: Secondary | ICD-10-CM | POA: Diagnosis not present

## 2021-02-07 DIAGNOSIS — E1165 Type 2 diabetes mellitus with hyperglycemia: Secondary | ICD-10-CM | POA: Diagnosis not present

## 2021-02-07 DIAGNOSIS — I5022 Chronic systolic (congestive) heart failure: Secondary | ICD-10-CM | POA: Diagnosis not present

## 2021-02-07 DIAGNOSIS — I10 Essential (primary) hypertension: Secondary | ICD-10-CM | POA: Diagnosis not present

## 2021-02-14 ENCOUNTER — Other Ambulatory Visit: Payer: Self-pay

## 2021-02-14 ENCOUNTER — Ambulatory Visit (HOSPITAL_COMMUNITY)
Admission: RE | Admit: 2021-02-14 | Discharge: 2021-02-14 | Disposition: A | Discharge status: Home / Self Care | Payer: HMO | Source: Ambulatory Visit | Attending: Cardiology | Admitting: Cardiology

## 2021-02-14 DIAGNOSIS — I5022 Chronic systolic (congestive) heart failure: Secondary | ICD-10-CM

## 2021-02-14 LAB — BASIC METABOLIC PANEL
Anion gap: 7 (ref 5–15)
BUN: 25 mg/dL — ABNORMAL HIGH (ref 8–23)
CO2: 23 mmol/L (ref 22–32)
Calcium: 8.9 mg/dL (ref 8.9–10.3)
Chloride: 113 mmol/L — ABNORMAL HIGH (ref 98–111)
Creatinine, Ser: 1.84 mg/dL — ABNORMAL HIGH (ref 0.61–1.24)
GFR, Estimated: 40 mL/min — ABNORMAL LOW (ref >60)
Glucose, Bld: 63 mg/dL — ABNORMAL LOW (ref 70–99)
Potassium: 4.1 mmol/L (ref 3.5–5.1)
Sodium: 143 mmol/L (ref 135–145)

## 2021-03-05 NOTE — Progress Notes (Signed)
Patient ID: Derek Mckenzie, male   DOB: 11/09/1956, 65 y.o.   MRN: [id number]   PCP: Dr. Woody Seller HF Cardiology: Dr. Aundra Dubin  65 y.o. with history of HTN, DM, PAD, CKD and systolic CHF presents for CHF clinic followup.  He has had a long history of difficult-to-control HTN.  Medications were adjusted and repeat echo in 12/17 showed improvement in EF to 60-65%.  He has known PAD, significant on 12/17 peripheral arterial dopplers.  He saw Dr. Gwenlyn Found for evaluation and conservative treatment for now was recommended.  Echo in 12/18 showed EF 60-65%, moderate LVH.  Echo in 11/20 with EF 60-65%, moderate LVH, normal RV.   Patient returned for followup of CHF and HTN 12/21. Very hard of hearing.  Weight is stable.  BP remains elevated.  He denies significant exertional dyspnea or chest pain.  No claudication, no pedal ulcerations.  No orthopnea/PND.  No lightheadedness or palpitations.  Repeat peripheral arterial dopplers ordered.  Today he returns for HF follow up. Last visit (2/22) he had stable NYHA II symptoms, BP well-controlled. Now, overall feeling fine. Having leg cramps at night, takes mustard and walks around and they resolve. Denies increasing SOB, CP, dizziness, edema, or PND/Orthopnea. Appetite ok. No fever or chills. Weight at home 183-187 pounds. Taking all medications. Does not check BP at home.  Labs (11/16): LDL 109, HDL 26, K 4.1, creatinine 1.42, BNP 197, TSH normal Labs (3/17): K 4.2, creatinine 1.73 Labs (12/17): K 5.4, creatinine 1.5, TGs 448, unable to calculate LDL, BNP 51, hgb 12.4 Labs (1/18): K 5.1, creatinine 1.62 Labs (7/18): LDL 84, HDL 21, TGs 215 Labs (8/18): K 5.1, creatinine 1.84 Labs (10/18): LDL 64, HDL 22, LFTs normal Labs (12/18): K 4.8, creatinine 1.85 Labs (1/20): K 5.1, creatinine 1.82 Labs (10/21): LDL 82, HDL 27, K 4.1, creatinine 1.75, LFTs normal Labs (12/21): K 4.3, creatinine 1.93, hgb a1c 7.4 Labs (2/22): K 4.1, creatinine 1.84, LDL 53, HDL 24, LFTs  normal  PMH:  1. Type II diabetes. 2. HTN: x years, poorly controlled. 11/16 renal artery dopplers with no significant stenosis.  3. Deafness 4. PAD: 2012 peripheral arterial dopplers with right distal SFA stenosis and left mid SFA stenosis.  - Peripheral arterial dopplers (12/17): Probable right iliac obstruction and distal left SFA obstruction.  - ABIs (12/18): 0.61 right, 0.9 left (improved).  - ABIs (1/20): 0.62 right, 0.85 left 5. Sleep study negative for OSA 6. Chronic systolic CHF: Echo (97/58) with EF 30-35%, moderate LVH.  Possible hypertensive cardiomyopathy.  - Echo (12/17): EF 60-65%.  - Echo (12/18): EF 60-65%, moderate LVH.  - Echo (11/20): EF 60-65%, moderate LVH, normal RV.  7. CKD: Stage 3.  8. Hyperlipidemia  FH: No cardiac disease that he knows of.  +HTN.   Social History   Socioeconomic History  . Marital status: Single    Spouse name: Not on file  . Number of children: Not on file  . Years of education: Not on file  . Highest education level: Not on file  Occupational History  . Not on file  Tobacco Use  . Smoking status: Former Smoker    Packs/day: 0.50    Years: 35.00    Pack years: 17.50    Types: Cigarettes  . Smokeless tobacco: Never Used  . Tobacco comment: "quit smoking in ~ 2007"  Substance and Sexual Activity  . Alcohol use: No  . Drug use: No  . Sexual activity: Never  Other Topics Concern  . Not  on file  Social History Narrative   IN A LONG TERM PLATONIC RELATIONSHIP. BEEN GOOD FRIENDS FOR YEARS AND TAKING CARE OF EACH OTHER. SEES KIDS VERY RARELY. 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 HEARING AID DID NOT WORK.   Social Determinants of Health   Financial Resource Strain: Not on file  Food Insecurity: Not on file  Transportation Needs: Not on file  Physical Activity: Not on file  Stress: Not on file  Social Connections: Not on file  Intimate Partner Violence: Not on file   ROS: All systems reviewed and  negative except as per HPI.   Current Outpatient Medications  Medication Sig Dispense Refill  . Alpha-Lipoic Acid 200 MG CAPS Take 200 mg by mouth daily.     Marland Kitchen amLODipine (NORVASC) 10 MG tablet TAKE 1 TABLET BY MOUTH ONCE DAILY 30 tablet 3  . atorvastatin (LIPITOR) 80 MG tablet TAKE 1 TABLET BY MOUTH ONCE DAILY. 90 tablet 3  . carvedilol (COREG) 25 MG tablet Take 2 tablets (50 mg total) by mouth 2 (two) times daily with a meal. 120 tablet 11  . ezetimibe (ZETIA) 10 MG tablet Take 1 tablet (10 mg total) by mouth daily. 90 tablet 3  . fenofibrate (TRICOR) 145 MG tablet TAKE 1 TABLET BY MOUTH ONCE DAILY. 90 tablet 3  . folic acid (FOLVITE) 591 MCG tablet Take 400 mcg by mouth daily.     . hydrALAZINE (APRESOLINE) 100 MG tablet Take 1 tablet (100 mg total) by mouth 3 (three) times daily. 270 tablet 3  . insulin NPH-regular Human (70-30) 100 UNIT/ML injection Inject 13 Units into the skin in the morning and at bedtime. 10 mL 2  . isosorbide mononitrate (IMDUR) 60 MG 24 hr tablet Take 1 tablet (60 mg total) by mouth in the morning and at bedtime. 60 tablet 6  . latanoprost (XALATAN) 0.005 % ophthalmic solution Place 1 drop into both eyes at bedtime.    . sacubitril-valsartan (ENTRESTO) 49-51 MG Take 1 tablet by mouth 2 (two) times daily. 180 tablet 3  . spironolactone (ALDACTONE) 25 MG tablet Take 0.5 tablets (12.5 mg total) by mouth daily. 45 tablet 3  . vitamin B-12 (CYANOCOBALAMIN) 1000 MCG tablet Take 1,000 mcg by mouth daily.     Marland Kitchen aspirin EC 81 MG tablet Take 1 tablet (81 mg total) by mouth daily. 30 tablet 3   No current facility-administered medications for this encounter.   Wt Readings from Last 3 Encounters:  03/07/21 84.8 kg (187 lb)  02/05/21 84.8 kg (187 lb)  01/08/21 85.5 kg (188 lb 6.4 oz)   BP (!) 146/80   Pulse 81   Wt 84.8 kg (187 lb)   SpO2 98%   BMI 26.83 kg/m  General:  NAD. No resp difficulty HEENT: HOH, bilateral hearing aids. Neck: Supple. No JVD. Carotids 2+  bilat; no bruits. No lymphadenopathy or thryomegaly appreciated. Cor: PMI nondisplaced. Regular rate & rhythm. No rubs, gallops or murmurs. Lungs: Clear Abdomen: Obese, soft, nontender, nondistended. No hepatosplenomegaly. No bruits or masses. Good bowel sounds. Extremities: No cyanosis, clubbing, rash, edema Neuro: Alert & oriented x 3, cranial nerves grossly intact. Moves all 4 extremities w/o difficulty. Affect pleasant.  Assessment/Plan: 1. Chronic systolic => diastolic CHF: Echo 63/84 with EF 30-35%.  Given improvement in EF to 60-65% with BP control, suspect this was a hypertensive cardiomyopathy. Has moderate LVH on echo (11/20). NYHA class II, not volume overloaded on exam.  - Continue Entresto 49/51  mg bid. (Unable to tolerate 97/103 due to muscle cramps & diarrhea). - Continue hydralazine 100 mg tid + Imdur 60 mg daily. - Continue Coreg 50 mg bid.   - Continue spironolactone 12.5 mg daily, BMET today (has had h/o hyperkalemia) - Repeat echo. 2. HTN: Renal arterial dopplers in 11/16 did not show evidence for renal artery stenosis. Moderate LVH on last echo in 2020. BP much improved today. - Continue amlodipine, hydralazine, Imdur, coreg, spiro and Entresto.  3. PAD: No claudication.  ABIs abnormal in 1/20, but stable.     - Continue ASA 81 and statin.   - Repeat peripheral arterial dopplers (12/21) moderate right PAD, no change from prior. Repeat in 1 year (12/22). 4. Hyperlipidemia: Recent lipids with LDL > 70, continue Zetia 10 mg daily, repeat lipids/LFTs (2/22) good. 5. CKD IIIb: Stage 3.  BMET today.  6. Leg cramps: Will check BMET & Mag today.  Follow up with Dr. Aundra Dubin in 2 months as scheduled w/ repeat echo.  [place], FNP-BC 03/07/2021

## 2021-03-07 ENCOUNTER — Other Ambulatory Visit: Payer: Self-pay

## 2021-03-07 ENCOUNTER — Encounter (HOSPITAL_COMMUNITY): Payer: Self-pay

## 2021-03-07 ENCOUNTER — Ambulatory Visit (HOSPITAL_COMMUNITY)
Admission: RE | Admit: 2021-03-07 | Discharge: 2021-03-07 | Disposition: A | Discharge status: Home / Self Care | Payer: HMO | Source: Ambulatory Visit | Attending: Family Medicine | Admitting: Family Medicine

## 2021-03-07 VITALS — BP 146/80 | HR 81 | Wt 187.0 lb

## 2021-03-07 DIAGNOSIS — I5022 Chronic systolic (congestive) heart failure: Secondary | ICD-10-CM | POA: Diagnosis not present

## 2021-03-07 DIAGNOSIS — E1122 Type 2 diabetes mellitus with diabetic chronic kidney disease: Secondary | ICD-10-CM | POA: Diagnosis not present

## 2021-03-07 DIAGNOSIS — I5042 Chronic combined systolic (congestive) and diastolic (congestive) heart failure: Secondary | ICD-10-CM | POA: Diagnosis not present

## 2021-03-07 DIAGNOSIS — N1832 Chronic kidney disease, stage 3b: Secondary | ICD-10-CM

## 2021-03-07 DIAGNOSIS — Z87891 Personal history of nicotine dependence: Secondary | ICD-10-CM | POA: Diagnosis not present

## 2021-03-07 DIAGNOSIS — Z794 Long term (current) use of insulin: Secondary | ICD-10-CM | POA: Insufficient documentation

## 2021-03-07 DIAGNOSIS — I130 Hypertensive heart and chronic kidney disease with heart failure and stage 1 through stage 4 chronic kidney disease, or unspecified chronic kidney disease: Secondary | ICD-10-CM | POA: Diagnosis not present

## 2021-03-07 DIAGNOSIS — Z7982 Long term (current) use of aspirin: Secondary | ICD-10-CM | POA: Diagnosis not present

## 2021-03-07 DIAGNOSIS — R252 Cramp and spasm: Secondary | ICD-10-CM | POA: Diagnosis not present

## 2021-03-07 DIAGNOSIS — E1151 Type 2 diabetes mellitus with diabetic peripheral angiopathy without gangrene: Secondary | ICD-10-CM | POA: Diagnosis not present

## 2021-03-07 DIAGNOSIS — Z79899 Other long term (current) drug therapy: Secondary | ICD-10-CM | POA: Diagnosis not present

## 2021-03-07 DIAGNOSIS — E785 Hyperlipidemia, unspecified: Secondary | ICD-10-CM | POA: Diagnosis not present

## 2021-03-07 DIAGNOSIS — I739 Peripheral vascular disease, unspecified: Secondary | ICD-10-CM | POA: Diagnosis not present

## 2021-03-07 DIAGNOSIS — I10 Essential (primary) hypertension: Secondary | ICD-10-CM

## 2021-03-07 LAB — BASIC METABOLIC PANEL
Anion gap: 6 (ref 5–15)
BUN: 28 mg/dL — ABNORMAL HIGH (ref 8–23)
CO2: 24 mmol/L (ref 22–32)
Calcium: 8.9 mg/dL (ref 8.9–10.3)
Chloride: 109 mmol/L (ref 98–111)
Creatinine, Ser: 2.13 mg/dL — ABNORMAL HIGH (ref 0.61–1.24)
GFR, Estimated: 34 mL/min — ABNORMAL LOW (ref >60)
Glucose, Bld: 150 mg/dL — ABNORMAL HIGH (ref 70–99)
Potassium: 4.3 mmol/L (ref 3.5–5.1)
Sodium: 139 mmol/L (ref 135–145)

## 2021-03-07 LAB — MAGNESIUM: Magnesium: 2.4 mg/dL (ref 1.7–2.4)

## 2021-03-07 NOTE — Patient Instructions (Addendum)
No changes in medication!  Labs today We will only contact you if something comes back abnormal or we need to make some changes. Otherwise no news is good news!  Your physician has requested that you have an echocardiogram. Echocardiography is a painless test that uses sound waves to create images of your heart. It provides your doctor with information about the size and shape of your heart and how well your heart's chambers and valves are working. This procedure takes approximately one hour. There are no restrictions for this procedure.  Your physician recommends that you schedule a follow-up appointment in: Keep your appointment in May.  You will also have an Echo prior to your visit with Dr Aundra Dubin   Please call office at [phone] option 2 if you have any questions or concerns.   At the [facility], you and your health needs are our priority. As part of our continuing mission to provide you with exceptional heart care, we have created designated Provider Care Teams. These Care Teams include your primary Cardiologist (physician) and Advanced Practice Providers (APPs- Physician Assistants and Nurse Practitioners) who all work together to provide you with the care you need, when you need it.   You may see any of the following providers on your designated Care Team at your next follow up: Marland Kitchen Dr Glori Bickers . Dr Loralie Champagne . Dr Vickki Muff . Darrick Grinder, NP . Lyda Jester, [place], NP . Audry Riles, PharmD   Please be sure to bring in all your medications bottles to every appointment.

## 2021-03-13 DIAGNOSIS — H401132 Primary open-angle glaucoma, bilateral, moderate stage: Secondary | ICD-10-CM | POA: Diagnosis not present

## 2021-03-27 ENCOUNTER — Other Ambulatory Visit (HOSPITAL_COMMUNITY): Payer: HMO

## 2021-03-28 ENCOUNTER — Other Ambulatory Visit: Payer: Self-pay

## 2021-03-28 ENCOUNTER — Ambulatory Visit (HOSPITAL_COMMUNITY)
Admission: RE | Admit: 2021-03-28 | Discharge: 2021-03-28 | Disposition: A | Discharge status: Home / Self Care | Payer: HMO | Source: Ambulatory Visit | Attending: Internal Medicine | Admitting: Internal Medicine

## 2021-03-28 DIAGNOSIS — I5022 Chronic systolic (congestive) heart failure: Secondary | ICD-10-CM | POA: Diagnosis not present

## 2021-03-28 LAB — BASIC METABOLIC PANEL
Anion gap: 4 — ABNORMAL LOW (ref 5–15)
BUN: 21 mg/dL (ref 8–23)
CO2: 24 mmol/L (ref 22–32)
Calcium: 8.6 mg/dL — ABNORMAL LOW (ref 8.9–10.3)
Chloride: 112 mmol/L — ABNORMAL HIGH (ref 98–111)
Creatinine, Ser: 1.98 mg/dL — ABNORMAL HIGH (ref 0.61–1.24)
GFR, Estimated: 37 mL/min — ABNORMAL LOW (ref >60)
Glucose, Bld: 134 mg/dL — ABNORMAL HIGH (ref 70–99)
Potassium: 4.3 mmol/L (ref 3.5–5.1)
Sodium: 140 mmol/L (ref 135–145)

## 2021-05-06 ENCOUNTER — Telehealth (HOSPITAL_COMMUNITY): Payer: Self-pay | Admitting: Pharmacy Technician

## 2021-05-06 ENCOUNTER — Other Ambulatory Visit: Payer: Self-pay

## 2021-05-06 ENCOUNTER — Ambulatory Visit (HOSPITAL_COMMUNITY)
Admission: RE | Admit: 2021-05-06 | Discharge: 2021-05-06 | Disposition: A | Discharge status: Home / Self Care | Payer: HMO | Source: Ambulatory Visit | Attending: Cardiology | Admitting: Cardiology

## 2021-05-06 ENCOUNTER — Ambulatory Visit (HOSPITAL_BASED_OUTPATIENT_CLINIC_OR_DEPARTMENT_OTHER)
Admission: RE | Admit: 2021-05-06 | Discharge: 2021-05-06 | Disposition: A | Discharge status: Home / Self Care | Payer: HMO | Source: Ambulatory Visit | Attending: Internal Medicine | Admitting: Internal Medicine

## 2021-05-06 ENCOUNTER — Other Ambulatory Visit (HOSPITAL_COMMUNITY): Payer: Self-pay

## 2021-05-06 VITALS — BP 140/78 | HR 80 | Wt 185.2 lb

## 2021-05-06 DIAGNOSIS — Z7984 Long term (current) use of oral hypoglycemic drugs: Secondary | ICD-10-CM | POA: Insufficient documentation

## 2021-05-06 DIAGNOSIS — I5022 Chronic systolic (congestive) heart failure: Secondary | ICD-10-CM

## 2021-05-06 DIAGNOSIS — E785 Hyperlipidemia, unspecified: Secondary | ICD-10-CM | POA: Insufficient documentation

## 2021-05-06 DIAGNOSIS — E1151 Type 2 diabetes mellitus with diabetic peripheral angiopathy without gangrene: Secondary | ICD-10-CM | POA: Diagnosis not present

## 2021-05-06 DIAGNOSIS — I5042 Chronic combined systolic (congestive) and diastolic (congestive) heart failure: Secondary | ICD-10-CM | POA: Diagnosis not present

## 2021-05-06 DIAGNOSIS — I130 Hypertensive heart and chronic kidney disease with heart failure and stage 1 through stage 4 chronic kidney disease, or unspecified chronic kidney disease: Secondary | ICD-10-CM | POA: Insufficient documentation

## 2021-05-06 DIAGNOSIS — E1122 Type 2 diabetes mellitus with diabetic chronic kidney disease: Secondary | ICD-10-CM | POA: Insufficient documentation

## 2021-05-06 DIAGNOSIS — Z79899 Other long term (current) drug therapy: Secondary | ICD-10-CM | POA: Insufficient documentation

## 2021-05-06 DIAGNOSIS — N183 Chronic kidney disease, stage 3 unspecified: Secondary | ICD-10-CM | POA: Insufficient documentation

## 2021-05-06 DIAGNOSIS — Z794 Long term (current) use of insulin: Secondary | ICD-10-CM | POA: Insufficient documentation

## 2021-05-06 DIAGNOSIS — I739 Peripheral vascular disease, unspecified: Secondary | ICD-10-CM

## 2021-05-06 DIAGNOSIS — Z87891 Personal history of nicotine dependence: Secondary | ICD-10-CM | POA: Insufficient documentation

## 2021-05-06 DIAGNOSIS — Z7982 Long term (current) use of aspirin: Secondary | ICD-10-CM | POA: Insufficient documentation

## 2021-05-06 LAB — BASIC METABOLIC PANEL
Anion gap: 5 (ref 5–15)
BUN: 31 mg/dL — ABNORMAL HIGH (ref 8–23)
CO2: 23 mmol/L (ref 22–32)
Calcium: 8.7 mg/dL — ABNORMAL LOW (ref 8.9–10.3)
Chloride: 113 mmol/L — ABNORMAL HIGH (ref 98–111)
Creatinine, Ser: 2.19 mg/dL — ABNORMAL HIGH (ref 0.61–1.24)
GFR, Estimated: 33 mL/min — ABNORMAL LOW (ref >60)
Glucose, Bld: 125 mg/dL — ABNORMAL HIGH (ref 70–99)
Potassium: 5 mmol/L (ref 3.5–5.1)
Sodium: 141 mmol/L (ref 135–145)

## 2021-05-06 LAB — MAGNESIUM: Magnesium: 2.5 mg/dL — ABNORMAL HIGH (ref 1.7–2.4)

## 2021-05-06 LAB — ECHOCARDIOGRAM COMPLETE
Area-P 1/2: 5.27 cm2
S' Lateral: 2 cm

## 2021-05-06 MED ORDER — EMPAGLIFLOZIN 10 MG PO TABS: 10.0000 mg | ORAL_TABLET | Freq: Every day | ORAL | 11 refills | Status: DC

## 2021-05-06 NOTE — Progress Notes (Signed)
  Echocardiogram 2D Echocardiogram with 3D has been performed.  Derek Mckenzie M 05/06/2021, 9:45 AM

## 2021-05-06 NOTE — Patient Instructions (Signed)
START Jardiance 10 mg, one tab daily  Labs today We will only contact you if something comes back abnormal or we need to make some changes. Otherwise no news is good news!  Labs needed in 10 days  Your physician recommends that you schedule a follow-up appointment in: 6 months with Dr Aundra Dubin  Do the following things EVERYDAY: 1) Weigh yourself in the morning before breakfast. Write it down and keep it in a log. 2) Take your medicines as prescribed 3) Eat low salt foods--Limit salt (sodium) to 2000 mg per day.  4) Stay as active as you can everyday 5) Limit all fluids for the day to less than 2 liters  At the [facility], you and your health needs are our priority. As part of our continuing mission to provide you with exceptional heart care, we have created designated Provider Care Teams. These Care Teams include your primary Cardiologist (physician) and Advanced Practice Providers (APPs- Physician Assistants and Nurse Practitioners) who all work together to provide you with the care you need, when you need it.   You may see any of the following providers on your designated Care Team at your next follow up: Marland Kitchen Dr Glori Bickers . Dr Loralie Champagne . Dr Vickki Muff . Darrick Grinder, NP . Lyda Jester, [place] . Audry Riles, PharmD   Please be sure to bring in all your medications bottles to every appointment.   If you have any questions or concerns before your next appointment please send [place] a message through [place] or call our office at [phone].    TO LEAVE A MESSAGE FOR THE NURSE SELECT OPTION 2, PLEASE LEAVE A MESSAGE INCLUDING: . YOUR NAME . DATE OF BIRTH . CALL BACK NUMBER . REASON FOR CALL**this is important as we prioritize the call backs  YOU WILL RECEIVE A CALL BACK THE SAME DAY AS LONG AS YOU CALL BEFORE 4:00 PM

## 2021-05-06 NOTE — Progress Notes (Signed)
Patient ID: Derek Mckenzie, male   DOB: Apr 08, 1956, 65 y.o.   MRN: [id number] PCP: Dr. Woody Seller HF Cardiology: Dr. Aundra Dubin  65 y.o. with history of HTN, DM, PAD, CKD and systolic CHF presents for CHF clinic followup.  He has had a long history of difficult-to-control HTN.  Medications were adjusted and repeat echo in 12/17 showed improvement in EF to 60-65%.  He has known PAD, significant on 12/17 peripheral arterial dopplers.  He saw Dr. Gwenlyn Found for evaluation and conservative treatment for now was recommended.  Echo in 12/18 showed EF 60-65%, moderate LVH.  Echo in 11/20 with EF 60-65%, moderate LVH, normal RV.   Echo done today was reviewed, showing EF 60-65% with moderate LVH and normal RV.   Patient returns for followup of CHF and HTN. Very hard of hearing.  Weight is down 2 lbs.  BP mildly elevated today but runs SBP in 120s when he checks at home. He has right calf claudication after walking about 1/2 mile.  No chest pain.  No exertional dyspnea.  No orthopnea/PND.  Main complaint is nocturnal cramps.   Labs (11/16): LDL 109, HDL 26, K 4.1, creatinine 1.42, BNP 197, TSH normal Labs (3/17): K 4.2, creatinine 1.73 Labs (12/17): K 5.4, creatinine 1.5, TGs 448, unable to calculate LDL, BNP 51, hgb 12.4 Labs (1/18): K 5.1, creatinine 1.62 Labs (7/18): LDL 84, HDL 21, TGs 215 Labs (8/18): K 5.1, creatinine 1.84 Labs (10/18): LDL 64, HDL 22, LFTs normal Labs (12/18): K 4.8, creatinine 1.85 Labs (1/20): K 5.1, creatinine 1.82 Labs (10/21): LDL 82, HDL 27, K 4.1, creatinine 1.75, LFTs normal Labs (2/22): LDL 53, TGs 103 Labs (3/22): K 4.3, creatinine 1.98  PMH:  1. Type II diabetes. 2. HTN: x years, poorly controlled. 11/16 renal artery dopplers with no significant stenosis.  3. Deafness 4. PAD: 2012 peripheral arterial dopplers with right distal SFA stenosis and left mid SFA stenosis.  - Peripheral arterial dopplers (12/17): Probable right iliac obstruction and distal left SFA obstruction.  -  ABIs (12/18): 0.61 right, 0.9 left (improved).  - ABIs (1/20): 0.62 right, 0.85 left - ABIs (12/21): moderate right PAD, no changes.  5. Sleep study negative for OSA 6. Chronic systolic CHF: Echo (37/90) with EF 30-35%, moderate LVH.  Possible hypertensive cardiomyopathy.  - Echo (12/17): EF 60-65%.  - Echo (12/18): EF 60-65%, moderate LVH.  - Echo (11/20): EF 60-65%, moderate LVH, normal RV.  7. CKD: Stage 3.  8. Hyperlipidemia  FH: No cardiac disease that he knows of.  +HTN.   Social History   Socioeconomic History  . Marital status: Single    Spouse name: Not on file  . Number of children: Not on file  . Years of education: Not on file  . Highest education level: Not on file  Occupational History  . Not on file  Tobacco Use  . Smoking status: Former Smoker    Packs/day: 0.50    Years: 35.00    Pack years: 17.50    Types: Cigarettes  . Smokeless tobacco: Never Used  . Tobacco comment: "quit smoking in ~ 2007"  Substance and Sexual Activity  . Alcohol use: No  . Drug use: No  . Sexual activity: Never  Other Topics Concern  . Not on file  Social History Narrative   IN A LONG TERM PLATONIC RELATIONSHIP. BEEN GOOD FRIENDS FOR YEARS AND TAKING CARE OF EACH OTHER. SEES KIDS VERY RARELY. USED TO BE A TRUCK DRIVER AND THAT MESSED HIS  HEARING UP. HEARING AID DID NOT WORK.   Social Determinants of Health   Financial Resource Strain: Not on file  Food Insecurity: Not on file  Transportation Needs: Not on file  Physical Activity: Not on file  Stress: Not on file  Social Connections: Not on file  Intimate Partner Violence: Not on file   ROS: All systems reviewed and negative except as per HPI.   Current Outpatient Medications  Medication Sig Dispense Refill  . Alpha-Lipoic Acid 200 MG CAPS Take 200 mg by mouth daily.     Marland Kitchen amLODipine (NORVASC) 10 MG tablet TAKE 1 TABLET BY MOUTH ONCE DAILY 30 tablet 3  . aspirin EC 81 MG tablet Take 1 tablet (81 mg total) by mouth daily.  30 tablet 3  . atorvastatin (LIPITOR) 80 MG tablet TAKE 1 TABLET BY MOUTH ONCE DAILY. 90 tablet 3  . carvedilol (COREG) 25 MG tablet Take 2 tablets (50 mg total) by mouth 2 (two) times daily with a meal. 120 tablet 11  . empagliflozin (JARDIANCE) 10 MG TABS tablet Take 1 tablet (10 mg total) by mouth daily before breakfast. 30 tablet 11  . ezetimibe (ZETIA) 10 MG tablet Take 1 tablet (10 mg total) by mouth daily. 90 tablet 3  . fenofibrate (TRICOR) 145 MG tablet TAKE 1 TABLET BY MOUTH ONCE DAILY. 90 tablet 3  . folic acid (FOLVITE) 710 MCG tablet Take 400 mcg by mouth daily.     . hydrALAZINE (APRESOLINE) 100 MG tablet Take 1 tablet (100 mg total) by mouth 3 (three) times daily. 270 tablet 3  . insulin NPH-regular Human (70-30) 100 UNIT/ML injection Inject 13 Units into the skin in the morning and at bedtime. (Patient taking differently: Inject 14 Units into the skin in the morning and at bedtime.) 10 mL 2  . isosorbide mononitrate (IMDUR) 60 MG 24 hr tablet Take 1 tablet (60 mg total) by mouth in the morning and at bedtime. 60 tablet 6  . latanoprost (XALATAN) 0.005 % ophthalmic solution Place 1 drop into both eyes at bedtime.    . sacubitril-valsartan (ENTRESTO) 49-51 MG Take 1 tablet by mouth 2 (two) times daily. 180 tablet 3  . spironolactone (ALDACTONE) 25 MG tablet Take 0.5 tablets (12.5 mg total) by mouth daily. 45 tablet 3  . vitamin B-12 (CYANOCOBALAMIN) 1000 MCG tablet Take 1,000 mcg by mouth daily.      No current facility-administered medications for this encounter.   BP 140/78   Pulse 80   Wt 84 kg (185 lb 3.2 oz)   SpO2 98%   BMI 26.57 kg/m  General: NAD Neck: No JVD, no thyromegaly or thyroid nodule.  Lungs: Clear to auscultation bilaterally with normal respiratory effort. CV: Nondisplaced PMI.  Heart regular S1/S2, no S3/S4, no murmur.  1+ ankle edema.  No carotid bruit.  Unable to palpate pedal pulses.  Abdomen: Soft, nontender, no hepatosplenomegaly, no distention.   Skin: Intact without lesions or rashes.  Neurologic: Alert and oriented x 3.  Psych: Normal affect. Extremities: No clubbing or cyanosis.  HEENT: Normal.   Assessment/Plan: 1. Chronic systolic => diastolic CHF: Echo 62/69 with EF 30-35%.  Given improvement in EF to 60-65% with BP control, suspect this was a hypertensive cardiomyopathy. Has moderate LVH on echo.  NYHA class I-II, not volume overloaded on exam.  - Continue, Entresto, hydralazine/Imdur, spironolactone, and Coreg.  BMET today.  2. HTN: Renal arterial dopplers in 11/16 did not show evidence for renal artery stenosis. Moderate LVH on today's  echo.  He was unable to tolerate Entresto 97/103 bid due to mucle cramps and diarrhea.  BP controlled on current regimen.  3. PAD: Stable mild claudication.  ABIs abnormal in 12/21 but stable.     - Continue ASA 81 and statin.   4. Hyperlipidemia:  Good lipids in 2/22.    5. CKD: Stage 3.   - BMET today.  - Add Jardiance 10 mg daily, BMET 10 days.   Followup 6 months.    Loralie Champagne 05/06/2021

## 2021-05-06 NOTE — Telephone Encounter (Addendum)
I received a message from the patient's wife that they would like to apply for assistance for Jardiance that was started at today's visit. Upon investigation, the patient's current 30 day co-pay is $0. He would not be approved for assistance due to the co-pay.  The patient's wife was worried it would throw him into the donut hole. The patient has also been approved for [employer] [name]) assistance for the entire year, it iis not being billed to insurance. That will help him with trying to stay out of the donut hole. I advised that we would be able to fill out an application for [employer] if an unaffordable co-pay was to occur.  Charlann Boxer, CPhT

## 2021-05-07 DIAGNOSIS — E1142 Type 2 diabetes mellitus with diabetic polyneuropathy: Secondary | ICD-10-CM | POA: Diagnosis not present

## 2021-05-07 DIAGNOSIS — B351 Tinea unguium: Secondary | ICD-10-CM | POA: Diagnosis not present

## 2021-05-07 DIAGNOSIS — M79676 Pain in unspecified toe(s): Secondary | ICD-10-CM | POA: Diagnosis not present

## 2021-05-07 DIAGNOSIS — L84 Corns and callosities: Secondary | ICD-10-CM | POA: Diagnosis not present

## 2021-05-16 ENCOUNTER — Other Ambulatory Visit (HOSPITAL_COMMUNITY): Payer: Self-pay

## 2021-05-16 ENCOUNTER — Ambulatory Visit (HOSPITAL_COMMUNITY)
Admission: RE | Admit: 2021-05-16 | Discharge: 2021-05-16 | Disposition: A | Discharge status: Home / Self Care | Payer: HMO | Source: Ambulatory Visit | Attending: Internal Medicine | Admitting: Internal Medicine

## 2021-05-16 ENCOUNTER — Other Ambulatory Visit: Payer: Self-pay

## 2021-05-16 DIAGNOSIS — I5022 Chronic systolic (congestive) heart failure: Secondary | ICD-10-CM | POA: Diagnosis not present

## 2021-05-16 LAB — BASIC METABOLIC PANEL
Anion gap: 5 (ref 5–15)
BUN: 28 mg/dL — ABNORMAL HIGH (ref 8–23)
CO2: 23 mmol/L (ref 22–32)
Calcium: 8.7 mg/dL — ABNORMAL LOW (ref 8.9–10.3)
Chloride: 112 mmol/L — ABNORMAL HIGH (ref 98–111)
Creatinine, Ser: 2.26 mg/dL — ABNORMAL HIGH (ref 0.61–1.24)
GFR, Estimated: 31 mL/min — ABNORMAL LOW (ref >60)
Glucose, Bld: 128 mg/dL — ABNORMAL HIGH (ref 70–99)
Potassium: 4.5 mmol/L (ref 3.5–5.1)
Sodium: 140 mmol/L (ref 135–145)

## 2021-05-19 ENCOUNTER — Other Ambulatory Visit: Payer: Self-pay | Admitting: Internal Medicine

## 2021-05-23 DIAGNOSIS — Z23 Encounter for immunization: Secondary | ICD-10-CM | POA: Diagnosis not present

## 2021-05-30 ENCOUNTER — Other Ambulatory Visit (HOSPITAL_COMMUNITY): Payer: Self-pay | Admitting: Cardiology

## 2021-06-12 ENCOUNTER — Encounter: Payer: Self-pay | Admitting: Internal Medicine

## 2021-06-12 ENCOUNTER — Other Ambulatory Visit: Payer: Self-pay

## 2021-06-12 ENCOUNTER — Ambulatory Visit (INDEPENDENT_AMBULATORY_CARE_PROVIDER_SITE_OTHER): Payer: HMO | Admitting: Internal Medicine

## 2021-06-12 VITALS — BP 126/84 | HR 88 | Ht 65.5 in | Wt 182.0 lb

## 2021-06-12 DIAGNOSIS — E118 Type 2 diabetes mellitus with unspecified complications: Secondary | ICD-10-CM | POA: Diagnosis not present

## 2021-06-12 DIAGNOSIS — Z794 Long term (current) use of insulin: Secondary | ICD-10-CM

## 2021-06-12 LAB — POCT GLYCOSYLATED HEMOGLOBIN (HGB A1C): Hemoglobin A1C: 7.3 % — AB (ref 4.0–5.6)

## 2021-06-12 NOTE — Progress Notes (Signed)
Name: Derek Mckenzie  Age/ Sex: 65 y.o., male   MRN/ DOB: [id number], Jun 14, 1956     PCP: Glenda Chroman, MD   Reason for Endocrinology Evaluation: Type 2 Diabetes Mellitus  Initial Endocrine Consultative Visit: 05/16/2019    PATIENT IDENTIFIER: Derek Mckenzie is a 65 y.o. male with a past medical history of HTN, T2DM, cardiomyopathy and dyslipidemia. The patient has followed with Endocrinology clinic since 05/16/2019 for consultative assistance with management of his diabetes.  DIABETIC HISTORY:  Derek Mckenzie was diagnosed with T2DM > 20 yrs ago. He does not recall prior oral glycemic agents, he has been on insulin for years.  His hemoglobin A1c has ranged from 12.2% in 2016, peaking at 13.1% in  2019   On his initial visit to our clinic, his A1c was 12.2% and he was on Novolin- N only. We started him on Novolin- Mix   He declined switching to Vials in 2022  SUBJECTIVE:   During the last visit (12/03/2020): A1c 7.4 % . We adjusted novolin- Mix (70/30)      Today (06/12/2021): Derek Mckenzie is here for a 6 month follow up on diabetes management, he is accompanied by his roommate [name].  He checks his blood sugars 2 times daily, preprandial to breakfast and supper.  The patient has has not had hypoglycemic episodes since the last clinic visit.  He denies sob , nausea or vomiting   HOME DIABETES REGIMEN:  Novolin Mix (70/30) 13 units BID with Breakfast and supper      METER DOWNLOAD SUMMARY: 6/1-6/15/2022  Overall Mean FS Glucose = 143 Standard Deviation = 36  BG Ranges: Low = 75 High = 240   Hypoglycemic Events/30 Days: BG < 50 = 0 Episodes of symptomatic severe hypoglycemia = 0     DIABETIC COMPLICATIONS: Microvascular complications:  CKD III Denies: neuropathy, retinopathy Last eye exam: Completed 07/11/2020   Macrovascular complications:  CHF, PVD Denies: CAD, CVA    HISTORY:  Past Medical History:  Past Medical History:  Diagnosis Date    CHF (congestive heart failure) (HCC)    Deafness in left ear    ED (erectile dysfunction)    Hearing loss in right ear    Hyperlipidemia    Hypertension    PAD (peripheral artery disease) ([place])    Type II diabetes mellitus ([place])    Past Surgical History:  Past Surgical History:  Procedure Laterality Date   CATARACT EXTRACTION W/PHACO Left 03/11/2016   Procedure: CATARACT EXTRACTION PHACO AND INTRAOCULAR LENS PLACEMENT ([place]);  Surgeon: Rutherford Guys, MD;  Location: [facility];  Service: Ophthalmology;  Laterality: Left;  CDE:2.71   CATARACT EXTRACTION W/PHACO Right 05/20/2016   Procedure: CATARACT EXTRACTION PHACO AND INTRAOCULAR LENS PLACEMENT ([facility]);  Surgeon: Rutherford Guys, MD;  Location: [facility];  Service: Ophthalmology;  Laterality: Right;  CDE: 4.33   COLONOSCOPY N/A 01/20/2020   Procedure: COLONOSCOPY;  Surgeon: Danie Binder, MD;  Location: [facility];  Service: Endoscopy;  Laterality: N/A;  8:30am   POLYPECTOMY  01/20/2020   Procedure: POLYPECTOMY;  Surgeon: Danie Binder, MD;  Location: [facility];  Service: Endoscopy;;   RETINAL LASER PROCEDURE     VASECTOMY     YAG LASER APPLICATION Right 9/70/2637   Procedure: YAG LASER APPLICATION;  Surgeon: Rutherford Guys, MD;  Location: [facility];  Service: Ophthalmology;  Laterality: Right;   Social History:  reports that he has quit smoking. His smoking use included cigarettes. He has a 17.50 pack-year smoking  history. He has never used smokeless tobacco. He reports that he does not drink alcohol and does not use drugs. Family History:  Family History  Problem Relation Age of Onset   Hypertension Mother    Stroke Mother    Heart disease Father    Other Neg Hx        gynecomastia   Colon cancer Neg Hx    Colon polyps Neg Hx      HOME MEDICATIONS: Allergies as of 06/12/2021       Reactions   Atenolol Shortness Of Breath   Amlodipine Besy-benazepril Hcl Other (See Comments)   Headaches        Medication List        Accurate  as of June 12, 2021 12:53 PM. If you have any questions, ask your nurse or doctor.          Alpha-Lipoic Acid 200 MG Caps Take 200 mg by mouth daily.   amLODipine 10 MG tablet Commonly known as: NORVASC TAKE 1 TABLET BY MOUTH ONCE DAILY   aspirin EC 81 MG tablet Take 1 tablet (81 mg total) by mouth daily.   atorvastatin 80 MG tablet Commonly known as: LIPITOR TAKE 1 TABLET BY MOUTH ONCE DAILY.   carvedilol 25 MG tablet Commonly known as: COREG Take 2 tablets (50 mg total) by mouth 2 (two) times daily with a meal.   empagliflozin 10 MG Tabs tablet Commonly known as: Jardiance Take 1 tablet (10 mg total) by mouth daily before breakfast.   Entresto 49-51 MG Generic drug: sacubitril-valsartan Take 1 tablet by mouth 2 (two) times daily.   ezetimibe 10 MG tablet Commonly known as: ZETIA Take 1 tablet (10 mg total) by mouth daily.   fenofibrate 145 MG tablet Commonly known as: TRICOR TAKE 1 TABLET BY MOUTH ONCE DAILY.   folic acid 528 MCG tablet Commonly known as: FOLVITE Take 400 mcg by mouth daily.   hydrALAZINE 100 MG tablet Commonly known as: APRESOLINE Take 1 tablet (100 mg total) by mouth 3 (three) times daily.   isosorbide mononitrate 60 MG 24 hr tablet Commonly known as: IMDUR TAKE 1 TABLET IN THE MORNING AND 1 TABLET AT BEDTIME.   latanoprost 0.005 % ophthalmic solution Commonly known as: XALATAN Place 1 drop into both eyes at bedtime.   NovoLIN 70/30 ReliOn (70-30) 100 UNIT/ML injection Generic drug: insulin NPH-regular Human Inject 14 Units into the skin in the morning and at bedtime.   spironolactone 25 MG tablet Commonly known as: ALDACTONE Take 0.5 tablets (12.5 mg total) by mouth daily.   vitamin B-12 1000 MCG tablet Commonly known as: CYANOCOBALAMIN Take 1,000 mcg by mouth daily.         OBJECTIVE:   Vital Signs: BP 126/84   Pulse 88   Ht 5' 5.5" (1.664 m)   Wt 182 lb (82.6 kg)   SpO2 98%   BMI 29.83 kg/m   Wt Readings from  Last 3 Encounters:  06/12/21 182 lb (82.6 kg)  05/06/21 185 lb 3.2 oz (84 kg)  03/07/21 187 lb (84.8 kg)     Exam: General: Pt appears well and is in NAD  Lungs: Clear with good BS bilat with no rales, rhonchi, or wheezes  Heart: RRR with normal S1 and S2 and no gallops; no murmurs; no rub  Extremities: Trace pretibial edema. No tremor.   Neuro: MS is good with appropriate affect, pt is alert and Ox3     DM foot exam: 06/12/2021 The skin  of the feet is intact without sores or ulcerations.Bilateral planter callous formation noted and thickened, discolored toe nails The pedal pulses are undetectable  The sensation is decreased  to a screening 5.07, 10 gram monofilament bilaterally            DATA REVIEWED:  Lab Results  Component Value Date   HGBA1C 7.3 (A) 06/12/2021   HGBA1C 7.4 (A) 12/03/2020   HGBA1C 7.9 (A) 07/30/2020   Lab Results  Component Value Date   MICROALBUR 9.7 (H) 12/03/2020   LDLCALC 53 02/05/2021   CREATININE 2.26 (H) 05/16/2021    Lab Results  Component Value Date   CHOL 98 02/05/2021   HDL 24 (L) 02/05/2021   LDLCALC 53 02/05/2021   TRIG 103 02/05/2021   CHOLHDL 4.1 02/05/2021          ASSESSMENT / PLAN / RECOMMENDATIONS:   1) Type 2 Diabetes Mellitus, Sub-OPtimally controlled, With CKD III complications and PVD - Most recent A1c of 7.3 %. Goal A1c < 7.0 %.    - Dexcom has been cost prohibitive - Freestyle libre was inaccurate with 40-60 points lower then finger stick - We discussed switching to insulin pens as they are a safer and easier alternative but he declines and would like to stick with vials based on 11/2020 visit  - Historically unable to add glycemic agents due to concerns about the cost.     MEDICATIONS: - Continue  Novolin Mix (70/30) 14 units with Breakfast and with supper    EDUCATION / INSTRUCTIONS: BG monitoring instructions: Patient is instructed to check his blood sugars 2 times a day, before breakfast and  supper. Call [place] [facility] if: BG persistently < 70 I reviewed the Rule of 15 for the treatment of hypoglycemia in detail with the patient. Literature supplied.   F/U  4 months     Signed electronically by: Mack Guise, MD  [facility]  [address] [place], [place] [address] Phone: [phone] FAX: [phone]   CC: Glenda Chroman, MD [place] [address] Phone: [phone]  Fax: [phone]  Return to Endocrinology clinic as below: Future Appointments  Date Time Provider [place]  12/12/2021  9:30 AM Breslin Burklow, Melanie Crazier, MD [id number] None

## 2021-06-12 NOTE — Patient Instructions (Signed)
-   Continue Novolin -Mix  14 units with Breakfast and 14  units with Supper     - HOW TO TREAT LOW BLOOD SUGARS (Blood sugar LESS THAN 70 MG/DL) Please follow the RULE OF 15 for the treatment of hypoglycemia treatment (when your (blood sugars are less than 70 mg/dL)   STEP 1: Take 15 grams of carbohydrates when your blood sugar is low, which includes:  3-4 GLUCOSE TABS  OR 3-4 OZ OF JUICE OR REGULAR SODA OR ONE TUBE OF GLUCOSE GEL    STEP 2: RECHECK blood sugar in 15 MINUTES STEP 3: If your blood sugar is still low at the 15 minute recheck --> then, go back to STEP 1 and treat AGAIN with another 15 grams of carbohydrates. 

## 2021-06-13 ENCOUNTER — Other Ambulatory Visit (HOSPITAL_COMMUNITY): Payer: Self-pay | Admitting: Cardiology

## 2021-07-12 DIAGNOSIS — I429 Cardiomyopathy, unspecified: Secondary | ICD-10-CM | POA: Diagnosis not present

## 2021-07-12 DIAGNOSIS — Z299 Encounter for prophylactic measures, unspecified: Secondary | ICD-10-CM | POA: Diagnosis not present

## 2021-07-12 DIAGNOSIS — I10 Essential (primary) hypertension: Secondary | ICD-10-CM | POA: Diagnosis not present

## 2021-07-12 DIAGNOSIS — E1165 Type 2 diabetes mellitus with hyperglycemia: Secondary | ICD-10-CM | POA: Diagnosis not present

## 2021-07-12 DIAGNOSIS — I5022 Chronic systolic (congestive) heart failure: Secondary | ICD-10-CM | POA: Diagnosis not present

## 2021-07-12 DIAGNOSIS — Z794 Long term (current) use of insulin: Secondary | ICD-10-CM | POA: Diagnosis not present

## 2021-07-16 ENCOUNTER — Other Ambulatory Visit (HOSPITAL_COMMUNITY): Payer: Self-pay | Admitting: Internal Medicine

## 2021-07-19 DIAGNOSIS — Z299 Encounter for prophylactic measures, unspecified: Secondary | ICD-10-CM | POA: Diagnosis not present

## 2021-07-19 DIAGNOSIS — I10 Essential (primary) hypertension: Secondary | ICD-10-CM | POA: Diagnosis not present

## 2021-07-19 DIAGNOSIS — H6121 Impacted cerumen, right ear: Secondary | ICD-10-CM | POA: Diagnosis not present

## 2021-07-22 DIAGNOSIS — H401132 Primary open-angle glaucoma, bilateral, moderate stage: Secondary | ICD-10-CM | POA: Diagnosis not present

## 2021-07-22 DIAGNOSIS — Z794 Long term (current) use of insulin: Secondary | ICD-10-CM | POA: Diagnosis not present

## 2021-07-22 DIAGNOSIS — E119 Type 2 diabetes mellitus without complications: Secondary | ICD-10-CM | POA: Diagnosis not present

## 2021-07-22 DIAGNOSIS — H524 Presbyopia: Secondary | ICD-10-CM | POA: Diagnosis not present

## 2021-08-13 DIAGNOSIS — M79676 Pain in unspecified toe(s): Secondary | ICD-10-CM | POA: Diagnosis not present

## 2021-08-13 DIAGNOSIS — E1142 Type 2 diabetes mellitus with diabetic polyneuropathy: Secondary | ICD-10-CM | POA: Diagnosis not present

## 2021-08-13 DIAGNOSIS — B351 Tinea unguium: Secondary | ICD-10-CM | POA: Diagnosis not present

## 2021-08-13 DIAGNOSIS — L84 Corns and callosities: Secondary | ICD-10-CM | POA: Diagnosis not present

## 2021-08-30 DIAGNOSIS — Z299 Encounter for prophylactic measures, unspecified: Secondary | ICD-10-CM | POA: Diagnosis not present

## 2021-08-30 DIAGNOSIS — Z7189 Other specified counseling: Secondary | ICD-10-CM | POA: Diagnosis not present

## 2021-08-30 DIAGNOSIS — Z0000 Encounter for general adult medical examination without abnormal findings: Secondary | ICD-10-CM | POA: Diagnosis not present

## 2021-08-30 DIAGNOSIS — I5022 Chronic systolic (congestive) heart failure: Secondary | ICD-10-CM | POA: Diagnosis not present

## 2021-08-30 DIAGNOSIS — I10 Essential (primary) hypertension: Secondary | ICD-10-CM | POA: Diagnosis not present

## 2021-08-30 DIAGNOSIS — Z125 Encounter for screening for malignant neoplasm of prostate: Secondary | ICD-10-CM | POA: Diagnosis not present

## 2021-08-30 DIAGNOSIS — Z1339 Encounter for screening examination for other mental health and behavioral disorders: Secondary | ICD-10-CM | POA: Diagnosis not present

## 2021-08-30 DIAGNOSIS — E7800 Pure hypercholesterolemia, unspecified: Secondary | ICD-10-CM | POA: Diagnosis not present

## 2021-08-30 DIAGNOSIS — R5383 Other fatigue: Secondary | ICD-10-CM | POA: Diagnosis not present

## 2021-08-30 DIAGNOSIS — E1165 Type 2 diabetes mellitus with hyperglycemia: Secondary | ICD-10-CM | POA: Diagnosis not present

## 2021-08-30 DIAGNOSIS — Z1331 Encounter for screening for depression: Secondary | ICD-10-CM | POA: Diagnosis not present

## 2021-08-30 DIAGNOSIS — Z6831 Body mass index (BMI) 31.0-31.9, adult: Secondary | ICD-10-CM | POA: Diagnosis not present

## 2021-08-30 DIAGNOSIS — Z79899 Other long term (current) drug therapy: Secondary | ICD-10-CM | POA: Diagnosis not present

## 2021-09-23 ENCOUNTER — Other Ambulatory Visit: Payer: Self-pay | Admitting: Internal Medicine

## 2021-09-23 ENCOUNTER — Other Ambulatory Visit (HOSPITAL_COMMUNITY): Payer: Self-pay | Admitting: Internal Medicine

## 2021-10-11 DIAGNOSIS — Z23 Encounter for immunization: Secondary | ICD-10-CM | POA: Diagnosis not present

## 2021-10-17 DIAGNOSIS — Z23 Encounter for immunization: Secondary | ICD-10-CM | POA: Diagnosis not present

## 2021-10-22 ENCOUNTER — Other Ambulatory Visit: Payer: Self-pay | Admitting: Internal Medicine

## 2021-11-18 ENCOUNTER — Other Ambulatory Visit (HOSPITAL_COMMUNITY): Payer: Self-pay

## 2021-11-18 MED ORDER — ENTRESTO 49-51 MG PO TABS: 1.0000 | ORAL_TABLET | Freq: Two times a day (BID) | ORAL | 0 refills | Status: DC

## 2021-11-19 ENCOUNTER — Other Ambulatory Visit (HOSPITAL_COMMUNITY): Payer: Self-pay | Admitting: *Deleted

## 2021-11-19 DIAGNOSIS — M79676 Pain in unspecified toe(s): Secondary | ICD-10-CM | POA: Diagnosis not present

## 2021-11-19 DIAGNOSIS — E1142 Type 2 diabetes mellitus with diabetic polyneuropathy: Secondary | ICD-10-CM | POA: Diagnosis not present

## 2021-11-19 DIAGNOSIS — L84 Corns and callosities: Secondary | ICD-10-CM | POA: Diagnosis not present

## 2021-11-19 DIAGNOSIS — B351 Tinea unguium: Secondary | ICD-10-CM | POA: Diagnosis not present

## 2021-11-19 MED ORDER — ENTRESTO 49-51 MG PO TABS: 1.0000 | ORAL_TABLET | Freq: Two times a day (BID) | ORAL | 3 refills | Status: DC

## 2021-11-22 ENCOUNTER — Other Ambulatory Visit: Payer: Self-pay | Admitting: Internal Medicine

## 2021-11-25 DIAGNOSIS — H401132 Primary open-angle glaucoma, bilateral, moderate stage: Secondary | ICD-10-CM | POA: Diagnosis not present

## 2021-11-27 ENCOUNTER — Telehealth (HOSPITAL_COMMUNITY): Payer: Self-pay | Admitting: Pharmacy Technician

## 2021-11-27 NOTE — Telephone Encounter (Signed)
Advanced Heart Failure Patient Advocate Encounter  Sent in [employer] application via fax, 40/76.  Will follow up.

## 2021-11-28 ENCOUNTER — Other Ambulatory Visit (HOSPITAL_COMMUNITY): Payer: Self-pay | Admitting: Cardiology

## 2021-12-12 ENCOUNTER — Encounter: Payer: Self-pay | Admitting: Internal Medicine

## 2021-12-12 ENCOUNTER — Ambulatory Visit: Payer: HMO | Admitting: Internal Medicine

## 2021-12-12 ENCOUNTER — Other Ambulatory Visit: Payer: Self-pay

## 2021-12-12 VITALS — BP 138/70 | HR 87 | Ht 65.5 in | Wt 181.6 lb

## 2021-12-12 DIAGNOSIS — Z794 Long term (current) use of insulin: Secondary | ICD-10-CM

## 2021-12-12 DIAGNOSIS — E785 Hyperlipidemia, unspecified: Secondary | ICD-10-CM

## 2021-12-12 DIAGNOSIS — E1142 Type 2 diabetes mellitus with diabetic polyneuropathy: Secondary | ICD-10-CM | POA: Diagnosis not present

## 2021-12-12 DIAGNOSIS — E1122 Type 2 diabetes mellitus with diabetic chronic kidney disease: Secondary | ICD-10-CM | POA: Diagnosis not present

## 2021-12-12 DIAGNOSIS — N1832 Chronic kidney disease, stage 3b: Secondary | ICD-10-CM

## 2021-12-12 DIAGNOSIS — E118 Type 2 diabetes mellitus with unspecified complications: Secondary | ICD-10-CM | POA: Diagnosis not present

## 2021-12-12 LAB — LIPID PANEL
Cholesterol: 103 mg/dL (ref 0–200)
HDL: 25.7 mg/dL — ABNORMAL LOW (ref >39.00)
LDL Cholesterol: 57 mg/dL (ref 0–99)
NonHDL: 76.83
Total CHOL/HDL Ratio: 4
Triglycerides: 97 mg/dL (ref 0.0–149.0)
VLDL: 19.4 mg/dL (ref 0.0–40.0)

## 2021-12-12 LAB — BASIC METABOLIC PANEL
BUN: 28 mg/dL — ABNORMAL HIGH (ref 6–23)
CO2: 25 mEq/L (ref 19–32)
Calcium: 8.9 mg/dL (ref 8.4–10.5)
Chloride: 109 mEq/L (ref 96–112)
Creatinine, Ser: 2.22 mg/dL — ABNORMAL HIGH (ref 0.40–1.50)
GFR: 30.21 mL/min — ABNORMAL LOW (ref >60.00)
Glucose, Bld: 159 mg/dL — ABNORMAL HIGH (ref 70–99)
Potassium: 5 mEq/L (ref 3.5–5.1)
Sodium: 138 mEq/L (ref 135–145)

## 2021-12-12 LAB — POCT GLYCOSYLATED HEMOGLOBIN (HGB A1C): Hemoglobin A1C: 7.2 % — AB (ref 4.0–5.6)

## 2021-12-12 LAB — MICROALBUMIN / CREATININE URINE RATIO
Creatinine,U: 91.1 mg/dL
Microalb Creat Ratio: 2.3 mg/g (ref 0.0–30.0)
Microalb, Ur: 2.1 mg/dL — ABNORMAL HIGH (ref 0.0–1.9)

## 2021-12-12 MED ORDER — NOVOLIN 70/30 RELION (70-30) 100 UNIT/ML ~~LOC~~ SUSP
14.0000 [IU] | Freq: Two times a day (BID) | SUBCUTANEOUS | 3 refills | Status: DC
Start: 2021-12-12 — End: 2022-06-19

## 2021-12-12 MED ORDER — NOVOLIN 70/30 RELION (70-30) 100 UNIT/ML ~~LOC~~ SUSP: 14.0000 [IU] | Freq: Two times a day (BID) | SUBCUTANEOUS | 3 refills | Status: DC

## 2021-12-12 NOTE — Progress Notes (Signed)
Name: Derek Mckenzie  Age/ Sex: 65 y.o., male   MRN/ DOB: [id number], 1955/12/31     PCP: Glenda Chroman, MD   Reason for Endocrinology Evaluation: Type 2 Diabetes Mellitus  Initial Endocrine Consultative Visit: 05/16/2019    PATIENT IDENTIFIER: Derek Mckenzie is a 65 y.o. male with a past medical history of HTN, T2DM, cardiomyopathy and dyslipidemia. The patient has followed with Endocrinology clinic since 05/16/2019 for consultative assistance with management of his diabetes.  DIABETIC HISTORY:  Derek Mckenzie was diagnosed with T2DM > 20 yrs ago. He does not recall prior oral glycemic agents, he has been on insulin for years.  His hemoglobin A1c has ranged from 12.2% in 2016, peaking at 13.1% in  2019   On his initial visit to our clinic, his A1c was 12.2% and he was on Novolin- N only. We started him on Novolin- Mix   He declined switching to Vials in 2022  SUBJECTIVE:   During the last visit (06/12/2021): A1c 7.3% . We adjusted novolin- Mix (70/30)      Today (12/12/2021): Derek Mckenzie is here for a 6 month follow up on diabetes management.  He checks his blood sugars 2 times daily, preprandial to breakfast and supper.  The patient has has  had hypoglycemic episodes since the last clinic visit.  He denies sob , nausea or vomiting or diarrhea      HOME DIABETES REGIMEN:  Novolin Mix (70/30) 14 units BID with Breakfast and supper      METER DOWNLOAD SUMMARY: 11/16-12/15/2022  Overall Mean FS Glucose = 138 Standard Deviation = 37  BG Ranges: Low = 67 High = 218   Hypoglycemic Events/30 Days: BG < 50 = 0 Episodes of symptomatic severe hypoglycemia = 0     DIABETIC COMPLICATIONS: Microvascular complications:  CKD III Denies: neuropathy, retinopathy Last eye exam: Completed 10/2021   Macrovascular complications:  CHF, PVD Denies: CAD, CVA    HISTORY:  Past Medical History:  Past Medical History:  Diagnosis Date   CHF (congestive heart  failure) (HCC)    Deafness in left ear    ED (erectile dysfunction)    Hearing loss in right ear    Hyperlipidemia    Hypertension    PAD (peripheral artery disease) ([place])    Type II diabetes mellitus ([place])    Past Surgical History:  Past Surgical History:  Procedure Laterality Date   CATARACT EXTRACTION W/PHACO Left 03/11/2016   Procedure: CATARACT EXTRACTION PHACO AND INTRAOCULAR LENS PLACEMENT ([place]);  Surgeon: Rutherford Guys, MD;  Location: [facility];  Service: Ophthalmology;  Laterality: Left;  CDE:2.71   CATARACT EXTRACTION W/PHACO Right 05/20/2016   Procedure: CATARACT EXTRACTION PHACO AND INTRAOCULAR LENS PLACEMENT ([facility]);  Surgeon: Rutherford Guys, MD;  Location: [facility];  Service: Ophthalmology;  Laterality: Right;  CDE: 4.33   COLONOSCOPY N/A 01/20/2020   Procedure: COLONOSCOPY;  Surgeon: Danie Binder, MD;  Location[facility];  Service: Endoscopy;  Laterality: N/A;  8:30am   POLYPECTOMY  01/20/2020   Procedure: POLYPECTOMY;  Surgeon: Danie Binder, MD;  [facility];  Service: Endoscopy;;   RETINAL LASER PROCEDURE     VASECTOMY     YAG LASER APPLICATION Right 04/11/2439   Procedure: YAG LASER APPLICATION;  Surgeon: Rutherford Guys, MD;  Location: [facility];  Service: Ophthalmology;  Laterality: Right;   Social History:  reports that he has quit smoking. His smoking use included cigarettes. He has a 17.50 pack-year smoking history. He has  never used smokeless tobacco. He reports that he does not drink alcohol and does not use drugs. Family History:  Family History  Problem Relation Age of Onset   Hypertension Mother    Stroke Mother    Heart disease Father    Other Neg Hx        gynecomastia   Colon cancer Neg Hx    Colon polyps Neg Hx      HOME MEDICATIONS: Allergies as of 12/12/2021       Reactions   Atenolol Shortness Of Breath   Amlodipine Besy-benazepril Hcl Other (See Comments)   Headaches        Medication List        Accurate as of December 12, 2021 12:28 PM. If you have any questions, ask your nurse or doctor.          Alpha-Lipoic Acid 200 MG Caps Take 200 mg by mouth daily.   amLODipine 10 MG tablet Commonly known as: NORVASC TAKE 1 TABLET BY MOUTH ONCE DAILY   aspirin EC 81 MG tablet Take 1 tablet (81 mg total) by mouth daily.   atorvastatin 80 MG tablet Commonly known as: LIPITOR TAKE 1 TABLET BY MOUTH ONCE DAILY.   carvedilol 25 MG tablet Commonly known as: COREG Take 2 tablets (50 mg total) by mouth 2 (two) times daily with a meal.   empagliflozin 10 MG Tabs tablet Commonly known as: Jardiance Take 1 tablet (10 mg total) by mouth daily before breakfast.   Entresto 49-51 MG Generic drug: sacubitril-valsartan Take 1 tablet by mouth 2 (two) times daily.   ezetimibe 10 MG tablet Commonly known as: ZETIA TAKE 1 TABLET BY MOUTH ONCE DAILY.   fenofibrate 145 MG tablet Commonly known as: TRICOR TAKE 1 TABLET BY MOUTH ONCE DAILY.   folic acid 284 MCG tablet Commonly known as: FOLVITE Take 400 mcg by mouth daily.   hydrALAZINE 100 MG tablet Commonly known as: APRESOLINE TAKE 1 TABLET BY MOUTH 3 TIMES DAILY.   isosorbide mononitrate 60 MG 24 hr tablet Commonly known as: IMDUR TAKE 1 TABLET IN THE MORNING AND 1 TABLET AT BEDTIME.   latanoprost 0.005 % ophthalmic solution Commonly known as: XALATAN Place 1 drop into both eyes at bedtime.   NovoLIN 70/30 ReliOn (70-30) 100 UNIT/ML injection Generic drug: insulin NPH-regular Human Inject 14 Units into the skin 2 (two) times daily with a meal. What changed: See the new instructions. Changed by: [name], MD   spironolactone 25 MG tablet Commonly known as: ALDACTONE TAKE (1/2) TABLET BY MOUTH DAILY.   vitamin B-12 1000 MCG tablet Commonly known as: CYANOCOBALAMIN Take 1,000 mcg by mouth daily.         OBJECTIVE:   Vital Signs: BP 138/70 (BP Location: Left Arm, Patient Position: Sitting, Cuff Size: Normal)    Pulse 87    Ht 5'  5.5" (1.664 m)    Wt 181 lb 9.6 oz (82.4 kg)    SpO2 95%    BMI 29.76 kg/m   Wt Readings from Last 3 Encounters:  12/12/21 181 lb 9.6 oz (82.4 kg)  06/12/21 182 lb (82.6 kg)  05/06/21 185 lb 3.2 oz (84 kg)     Exam: General: Pt appears well and is in NAD  Lungs: Clear with good BS bilat with no rales, rhonchi, or wheezes  Heart: RRR with normal S1 and S2 and no gallops; no murmurs; no rub  Extremities: Trace pretibial edema. No tremor.   Neuro: MS  is good with appropriate affect, pt is alert and Ox3     DM foot exam: 06/12/2021 The skin of the feet is intact without sores or ulcerations.Bilateral planter callous formation noted and thickened, discolored toe nails The pedal pulses are undetectable  The sensation is decreased  to a screening 5.07, 10 gram monofilament bilaterally            DATA REVIEWED:  Lab Results  Component Value Date   HGBA1C 7.2 (A) 12/12/2021   HGBA1C 7.3 (A) 06/12/2021   HGBA1C 7.4 (A) 12/03/2020    Latest Reference Range & Units 12/12/21 09:56  Sodium 135 - 145 mEq/L 138  Potassium 3.5 - 5.1 mEq/L 5.0  Chloride 96 - 112 mEq/L 109  CO2 19 - 32 mEq/L 25  Glucose 70 - 99 mg/dL 159 (H)  BUN 6 - 23 mg/dL 28 (H)  Creatinine 0.40 - 1.50 mg/dL 2.22 (H)  Calcium 8.4 - 10.5 mg/dL 8.9  GFR >60.00 mL/min 30.21 (L)  Total CHOL/HDL Ratio  4  Cholesterol 0 - 200 mg/dL 103  HDL Cholesterol >39.00 mg/dL 25.70 (L)  LDL (calc) 0 - 99 mg/dL 57  MICROALB/CREAT RATIO 0.0 - 30.0 mg/g 2.3  NonHDL  76.83  Triglycerides 0.0 - 149.0 mg/dL 97.0  VLDL 0.0 - 40.0 mg/dL 19.4  Creatinine,U mg/dL 91.1  Microalb, Ur 0.0 - 1.9 mg/dL 2.1 (H)    ASSESSMENT / PLAN / RECOMMENDATIONS:   1) Type 2 Diabetes Mellitus, Sub-OPtimally controlled, With CKD III complications and PVD - Most recent A1c of 7.2 %. Goal A1c < 7.0 %.   -His A1c continues to be acceptable -He has been noted with hypoglycemia in late afternoon, I have suggested reducing his morning insulin but he  attributed hypoglycemia to increase physical activity and now that its cold weather he is going to be more sedentary and he would like to stay on the current dose of insulin. - Dexcom has been cost prohibitive - Freestyle libre was inaccurate with 40-60 points lower then finger stick - We discussed switching to insulin pens as they are a safer and easier alternative but he declines and would like to stick with vials based on 11/2020 visit  - Historically unable to add glycemic agents due to concerns about the cost.     MEDICATIONS: - Continue  Novolin Mix (70/30) 14 units with Breakfast and with supper    EDUCATION / INSTRUCTIONS: BG monitoring instructions: Patient is instructed to check his blood sugars 2 times a day, before breakfast and supper. Call [facility] if: BG persistently < 70 I reviewed the Rule of 15 for the treatment of hypoglycemia in detail with the patient. Literature supplied.    2) Dyslipidemia:  - LDL at goal , no changes    Medication Continue Atorvastatin 80 mg daily  F/U  6 months     Signed electronically by: Mack Guise, MD  [facility]  [facility] [address] [place], [place] [address] Phone: [phone] FAX: [phone]   CC: Glenda Chroman, MD [place] [address] Phone: [phone]  Fax: [phone]  Return to Endocrinology clinic as below: Future Appointments  Date Time Provider [place]  06/19/2022  9:30 AM Donna Snooks, Melanie Crazier, MD [facility] None

## 2021-12-12 NOTE — Patient Instructions (Signed)
-   Continue Novolin -Mix  14 units with Breakfast and 14  units with Supper     - HOW TO TREAT LOW BLOOD SUGARS (Blood sugar LESS THAN 70 MG/DL) Please follow the RULE OF 15 for the treatment of hypoglycemia treatment (when your (blood sugars are less than 70 mg/dL)   STEP 1: Take 15 grams of carbohydrates when your blood sugar is low, which includes:  3-4 GLUCOSE TABS  OR 3-4 OZ OF JUICE OR REGULAR SODA OR ONE TUBE OF GLUCOSE GEL    STEP 2: RECHECK blood sugar in 15 MINUTES STEP 3: If your blood sugar is still low at the 15 minute recheck --> then, go back to STEP 1 and treat AGAIN with another 15 grams of carbohydrates. 

## 2022-01-10 ENCOUNTER — Telehealth (HOSPITAL_COMMUNITY): Payer: Self-pay | Admitting: Pharmacist

## 2022-01-10 NOTE — Telephone Encounter (Signed)
Advanced Heart Failure Patient Advocate Encounter   Patient was approved to receive Entresto from [employer].   Patient ID: [id number] Effective dates: 12/29/21 through 12/28/22  Audry Riles, PharmD, BCPS, BCCP, CPP Heart Failure Clinic Pharmacist [phone]

## 2022-02-06 ENCOUNTER — Other Ambulatory Visit (HOSPITAL_COMMUNITY): Payer: Self-pay | Admitting: Cardiology

## 2022-02-10 ENCOUNTER — Other Ambulatory Visit (HOSPITAL_COMMUNITY): Payer: Self-pay

## 2022-02-10 MED ORDER — ENTRESTO 49-51 MG PO TABS: 1.0000 | ORAL_TABLET | Freq: Two times a day (BID) | ORAL | 0 refills | Status: DC

## 2022-02-17 ENCOUNTER — Other Ambulatory Visit (HOSPITAL_COMMUNITY): Payer: Self-pay | Admitting: Cardiology

## 2022-02-18 DIAGNOSIS — L84 Corns and callosities: Secondary | ICD-10-CM | POA: Diagnosis not present

## 2022-02-18 DIAGNOSIS — M79676 Pain in unspecified toe(s): Secondary | ICD-10-CM | POA: Diagnosis not present

## 2022-02-18 DIAGNOSIS — B351 Tinea unguium: Secondary | ICD-10-CM | POA: Diagnosis not present

## 2022-02-18 DIAGNOSIS — E1142 Type 2 diabetes mellitus with diabetic polyneuropathy: Secondary | ICD-10-CM | POA: Diagnosis not present

## 2022-03-24 ENCOUNTER — Other Ambulatory Visit (HOSPITAL_COMMUNITY): Payer: Self-pay | Admitting: Cardiology

## 2022-03-27 ENCOUNTER — Ambulatory Visit (INDEPENDENT_AMBULATORY_CARE_PROVIDER_SITE_OTHER): Payer: HMO

## 2022-03-27 ENCOUNTER — Encounter: Payer: Self-pay | Admitting: Physician Assistant

## 2022-03-27 ENCOUNTER — Ambulatory Visit: Payer: HMO | Admitting: Physician Assistant

## 2022-03-27 DIAGNOSIS — M25512 Pain in left shoulder: Secondary | ICD-10-CM | POA: Diagnosis not present

## 2022-03-27 DIAGNOSIS — G8929 Other chronic pain: Secondary | ICD-10-CM

## 2022-03-27 DIAGNOSIS — H401132 Primary open-angle glaucoma, bilateral, moderate stage: Secondary | ICD-10-CM | POA: Diagnosis not present

## 2022-03-27 MED ORDER — METHYLPREDNISOLONE ACETATE 40 MG/ML IJ SUSP
40.0000 mg | INTRAMUSCULAR | Status: AC | PRN
  Administered 2022-03-27: 40 mg via INTRA_ARTICULAR

## 2022-03-27 MED ORDER — LIDOCAINE HCL 1 % IJ SOLN
5.0000 mL | INTRAMUSCULAR | Status: AC | PRN
  Administered 2022-03-27: 5 mL

## 2022-03-27 NOTE — Progress Notes (Signed)
? ?Office Visit Note ?  ?Patient: Derek Mckenzie           ?Date of Birth: 1956/08/15           ?MRN: [id number] ?Visit Date: 03/27/2022 ?             ?Requested by: Glenda Chroman, MD ?[address] ?[place],  [place] [id number] ?PCP: Glenda Chroman, MD ? ?Chief Complaint  ?Patient presents with  ?? Left Shoulder - Pain  ? ? ? ? ?HPI: ?Patient is a pleasant 66 year old gentleman who presents with a 2 to 13-monthhistory of left shoulder pain.  He denies any injury but has previously been doing a lot of reaching with his job before he retired.  He denies any neck pain.  He has not tried any treatment for this.  He describes it as a dull aching that causes him to have difficulty sleeping denies any paresthesias ? ?Assessment & Plan: ?Visit Diagnoses:  ?1. Chronic left shoulder pain   ? ? ?Plan: Findings consistent with rotator cuff tendinitis.  He does have good strength.  Explained the natural history of this to the patient.  I will go forward with an injection today he understands this may temporarily alter his blood sugars..  [place]  He will follow-up with [facility] 4 to 6 weeks for reevaluation.  He may follow-up with [facility] [place] have to [place]? ?Follow-Up Instructions: No follow-ups on file.  ? ?Ortho Exam ? ?Patient is alert, oriented, no adenopathy, well-dressed, normal affect, normal respiratory effort. ?Examination of his shoulder he has forward elevation to about 160 degrees he does have some stiffness past that.  He does not want to internally rotate behind his back because of pain.  He has 5 out of 5 strength with resisted abduction external and internal rotation.  Distal strength is intact distal sensation is intact.  He has a positive empty can test and impingement findings ? ?Imaging: ?No results found. ?No images are attached to the encounter. ? ?Labs: ?Lab Results  ?Component Value Date  ? HGBA1C 7.2 (A) 12/12/2021  ?  HGBA1C 7.3 (A) 06/12/2021  ? HGBA1C 7.4 (A) 12/03/2020  ? ? ? ?Lab Results  ?Component Value Date  ? ALBUMIN 3.5 02/05/2021  ? ALBUMIN 3.7 10/18/2020  ? ALBUMIN 3.8 09/28/2017  ? ? ?Lab Results  ?Component Value Date  ? MG 2.5 (H) 05/06/2021  ? MG 2.4 03/07/2021  ? ?No results found for: VD25OH ? ?No results found for: PREALBUMIN ? ?  Latest Ref Rng & Units 12/12/2016  ? 11:39 AM 03/05/2016  ?  1:10 PM 11/14/2015  ?  3:18 PM  ?CBC EXTENDED  ?WBC 4.0 - 10.5 K/uL 10.1   10.3   11.8    ?RBC 4.22 - 5.81 MIL/uL 4.21   3.91   4.92    ?Hemoglobin 13.0 - 17.0 g/dL 12.4   11.3   14.3    ?HCT 39.0 - 52.0 % 36.6   34.3   43.3    ?Platelets 150 - 400 K/uL 353   313   324    ? ? ? ?There is no height or weight on file to calculate BMI. ? ?Orders:  ?Orders Placed This Encounter  ?Procedures  ?? XR Shoulder Left  ?? Ambulatory referral to Physical Therapy  ? ?No orders of the defined types were placed  in this encounter. ? ? ? Procedures: ?Large Joint Inj: L subacromial bursa on 03/27/2022 10:02 AM ?Indications: diagnostic evaluation and pain ?Details: 25 G 1.5 in needle, posterior approach ? ?Arthrogram: No ? ?Medications: 5 mL lidocaine 1 %; 40 mg methylPREDNISolone acetate 40 MG/ML ?Outcome: tolerated well, no immediate complications ?Procedure, treatment alternatives, risks and benefits explained, specific risks discussed. Consent was given by the patient.  ? ? ? ?Clinical Data: ?No additional findings. ? ?ROS: ? ?All other systems negative, except as noted in the HPI. ?Review of Systems ? ?Objective: ?Vital Signs: There were no vitals taken for this visit. ? ?Specialty Comments:  ?No specialty comments available. ? ?PMFS History: ?Patient Active Problem List  ? Diagnosis Date Noted  ?? Type 2 diabetes mellitus with diabetic polyneuropathy, with long-term current use of insulin ([place]) 01/31/2020  ?? History of colonic polyps   ?? Heme positive stool 10/12/2019  ?? Gynecomastia 08/27/2017  ?? Screening for prostate cancer 08/27/2017   ?? Diabetes mellitus ([place]) 10/31/2015  ?? Hyperlipidemia 10/31/2015  ?? Severe uncontrolled hypertension 10/31/2015  ?? PAD (peripheral artery disease) ([place]) 10/31/2015  ?? Orthopnea 10/31/2015  ?? Chronic systolic CHF (congestive heart failure) ([place]) 10/31/2015  ?? Uncontrolled hypertension   ?? Type 2 diabetes mellitus with complication, with long-term current use of insulin ([place])   ?? HCD (hypertensive cardiovascular disease) 06/26/2014  ?? Breath shortness 06/26/2014  ? ?Past Medical History:  ?Diagnosis Date  ?? CHF (congestive heart failure) ([place])   ?? Deafness in left ear   ?? ED (erectile dysfunction)   ?? Hearing loss in right ear   ?? Hyperlipidemia   ?? Hypertension   ?? PAD (peripheral artery disease) ([place])   ?? Type II diabetes mellitus ([place])   ?  ?Family History  ?Problem Relation Age of Onset  ?? Hypertension Mother   ?? Stroke Mother   ?? Heart disease Father   ?? Other Neg Hx   ?     gynecomastia  ?? Colon cancer Neg Hx   ?? Colon polyps Neg Hx   ?  ?Past Surgical History:  ?Procedure Laterality Date  ?? CATARACT EXTRACTION W/PHACO Left 03/11/2016  ? Procedure: CATARACT EXTRACTION PHACO AND INTRAOCULAR LENS PLACEMENT ([facility]);  Surgeon: Rutherford Guys, MD;  Location: [facility];  Service: Ophthalmology;  Laterality: Left;  CDE:2.71  ?? CATARACT EXTRACTION W/PHACO Right 05/20/2016  ? Procedure: CATARACT EXTRACTION PHACO AND INTRAOCULAR LENS PLACEMENT ([facility]);  Surgeon: Rutherford Guys, MD;  Location: [facility];  Service: Ophthalmology;  Laterality: Right;  CDE: 4.33  ?? COLONOSCOPY N/A 01/20/2020  ? Procedure: COLONOSCOPY;  Surgeon: Danie Binder, MD;  [facility];  Service: Endoscopy;  Laterality: N/A;  8:30am  ?? POLYPECTOMY  01/20/2020  ? Procedure: POLYPECTOMY;  Surgeon: Danie Binder, MD;  [facility];  Service: Endoscopy;;  ?? RETINAL LASER PROCEDURE    ?? VASECTOMY    ?? YAG LASER APPLICATION Right 8/58/8502  ? Procedure: YAG LASER APPLICATION;  Surgeon: Rutherford Guys, MD;  Location: [facility];  Service: Ophthalmology;  Laterality: Right;  ? ?Social History  ? ?Occupational History  ?? Not on file  ?Tobacco Use  ?? Smoking status: Former  ?  Packs/day: 0.50  ?  Years: 35.00  ?  Pack years: 17.50  ?  Types: Cigarettes  ?? Smokeless tobacco: Never  ?? Tobacco comments:  ?  "quit smoking in ~ 2007"  ?Substance and Sexual Activity  ?? Alcohol use: No  ?? Drug use: No  ??  Sexual activity: Never  ? ? ? ? ? ?

## 2022-04-02 DIAGNOSIS — M25512 Pain in left shoulder: Secondary | ICD-10-CM | POA: Diagnosis not present

## 2022-04-07 ENCOUNTER — Other Ambulatory Visit (HOSPITAL_COMMUNITY): Payer: Self-pay | Admitting: Cardiology

## 2022-04-09 DIAGNOSIS — M25512 Pain in left shoulder: Secondary | ICD-10-CM | POA: Diagnosis not present

## 2022-04-22 DIAGNOSIS — M25512 Pain in left shoulder: Secondary | ICD-10-CM | POA: Diagnosis not present

## 2022-04-24 ENCOUNTER — Other Ambulatory Visit (HOSPITAL_COMMUNITY): Payer: Self-pay | Admitting: Cardiology

## 2022-04-24 ENCOUNTER — Other Ambulatory Visit (HOSPITAL_COMMUNITY): Payer: Self-pay | Admitting: Internal Medicine

## 2022-04-30 ENCOUNTER — Ambulatory Visit: Payer: HMO | Admitting: Orthopaedic Surgery

## 2022-05-12 ENCOUNTER — Other Ambulatory Visit (HOSPITAL_COMMUNITY): Payer: Self-pay

## 2022-05-12 MED ORDER — ENTRESTO 49-51 MG PO TABS: 1.0000 | ORAL_TABLET | Freq: Two times a day (BID) | ORAL | 0 refills | Status: DC

## 2022-05-27 DIAGNOSIS — B351 Tinea unguium: Secondary | ICD-10-CM | POA: Diagnosis not present

## 2022-05-27 DIAGNOSIS — M79676 Pain in unspecified toe(s): Secondary | ICD-10-CM | POA: Diagnosis not present

## 2022-05-27 DIAGNOSIS — E1142 Type 2 diabetes mellitus with diabetic polyneuropathy: Secondary | ICD-10-CM | POA: Diagnosis not present

## 2022-05-27 DIAGNOSIS — L84 Corns and callosities: Secondary | ICD-10-CM | POA: Diagnosis not present

## 2022-05-29 ENCOUNTER — Other Ambulatory Visit (HOSPITAL_COMMUNITY): Payer: Self-pay

## 2022-05-29 MED ORDER — ENTRESTO 49-51 MG PO TABS: 1.0000 | ORAL_TABLET | Freq: Two times a day (BID) | ORAL | 0 refills | Status: DC

## 2022-06-03 DIAGNOSIS — N183 Chronic kidney disease, stage 3 unspecified: Secondary | ICD-10-CM | POA: Diagnosis not present

## 2022-06-03 DIAGNOSIS — Z789 Other specified health status: Secondary | ICD-10-CM | POA: Diagnosis not present

## 2022-06-03 DIAGNOSIS — M79605 Pain in left leg: Secondary | ICD-10-CM | POA: Diagnosis not present

## 2022-06-03 DIAGNOSIS — Z299 Encounter for prophylactic measures, unspecified: Secondary | ICD-10-CM | POA: Diagnosis not present

## 2022-06-03 DIAGNOSIS — I5022 Chronic systolic (congestive) heart failure: Secondary | ICD-10-CM | POA: Diagnosis not present

## 2022-06-03 DIAGNOSIS — M79604 Pain in right leg: Secondary | ICD-10-CM | POA: Diagnosis not present

## 2022-06-04 ENCOUNTER — Other Ambulatory Visit (HOSPITAL_COMMUNITY): Payer: Self-pay | Admitting: Cardiology

## 2022-06-09 ENCOUNTER — Other Ambulatory Visit (HOSPITAL_COMMUNITY): Payer: Self-pay | Admitting: Cardiology

## 2022-06-18 DIAGNOSIS — I743 Embolism and thrombosis of arteries of the lower extremities: Secondary | ICD-10-CM | POA: Diagnosis not present

## 2022-06-18 DIAGNOSIS — I70213 Atherosclerosis of native arteries of extremities with intermittent claudication, bilateral legs: Secondary | ICD-10-CM | POA: Diagnosis not present

## 2022-06-19 ENCOUNTER — Ambulatory Visit (INDEPENDENT_AMBULATORY_CARE_PROVIDER_SITE_OTHER): Payer: HMO | Admitting: Internal Medicine

## 2022-06-19 ENCOUNTER — Encounter: Payer: Self-pay | Admitting: Internal Medicine

## 2022-06-19 VITALS — BP 124/76 | HR 85 | Ht 65.5 in | Wt 185.2 lb

## 2022-06-19 DIAGNOSIS — N1832 Chronic kidney disease, stage 3b: Secondary | ICD-10-CM | POA: Diagnosis not present

## 2022-06-19 DIAGNOSIS — E1142 Type 2 diabetes mellitus with diabetic polyneuropathy: Secondary | ICD-10-CM | POA: Diagnosis not present

## 2022-06-19 DIAGNOSIS — E785 Hyperlipidemia, unspecified: Secondary | ICD-10-CM

## 2022-06-19 DIAGNOSIS — E118 Type 2 diabetes mellitus with unspecified complications: Secondary | ICD-10-CM | POA: Diagnosis not present

## 2022-06-19 DIAGNOSIS — Z794 Long term (current) use of insulin: Secondary | ICD-10-CM

## 2022-06-19 DIAGNOSIS — E1122 Type 2 diabetes mellitus with diabetic chronic kidney disease: Secondary | ICD-10-CM | POA: Diagnosis not present

## 2022-06-19 LAB — POCT GLYCOSYLATED HEMOGLOBIN (HGB A1C): Hemoglobin A1C: 7 % — AB (ref 4.0–5.6)

## 2022-06-19 MED ORDER — NOVOLIN 70/30 RELION (70-30) 100 UNIT/ML ~~LOC~~ SUSP: 14.0000 [IU] | Freq: Two times a day (BID) | SUBCUTANEOUS | 3 refills | Status: DC

## 2022-06-19 NOTE — Patient Instructions (Signed)
-   Continue Novolin -Mix  14 units with Breakfast and 14  units with Supper     - HOW TO TREAT LOW BLOOD SUGARS (Blood sugar LESS THAN 70 MG/DL) Please follow the RULE OF 15 for the treatment of hypoglycemia treatment (when your (blood sugars are less than 70 mg/dL)   STEP 1: Take 15 grams of carbohydrates when your blood sugar is low, which includes:  3-4 GLUCOSE TABS  OR 3-4 OZ OF JUICE OR REGULAR SODA OR ONE TUBE OF GLUCOSE GEL    STEP 2: RECHECK blood sugar in 15 MINUTES STEP 3: If your blood sugar is still low at the 15 minute recheck --> then, go back to STEP 1 and treat AGAIN with another 15 grams of carbohydrates. 

## 2022-06-19 NOTE — Progress Notes (Signed)
Name: Derek Mckenzie  Age/ Sex: 66 y.o., male   MRN/ DOB: [id number], [birth year]     PCP: Glenda Chroman, MD   Reason for Endocrinology Evaluation: Type 2 Diabetes Mellitus  Initial Endocrine Consultative Visit: 05/16/2019    PATIENT IDENTIFIER: Derek Mckenzie is a 66 y.o. male with a past medical history of HTN, T2DM, cardiomyopathy and dyslipidemia. The patient has followed with Endocrinology clinic since 05/16/2019 for consultative assistance with management of his diabetes.  DIABETIC HISTORY:  Derek Mckenzie was diagnosed with T2DM > 20 yrs ago. He does not recall prior oral glycemic agents, he has been on insulin for years.  His hemoglobin A1c has ranged from 12.2% in 2016, peaking at 13.1% in  2019   On his initial visit to our clinic, his A1c was 12.2% and he was on Novolin- N only. We started him on Novolin- Mix   He declined switching to Vials in 2022  SUBJECTIVE:   During the last visit (12/12/2021): A1c 7.2% . We adjusted novolin- Mix (70/30)      Today (06/19/2022): Derek Mckenzie is here for a  follow up on diabetes management.  He checks his blood sugars 2 times daily, preprandial to breakfast and supper.  The patient has has  had hypoglycemic episodes since the last clinic visit.  He denies sob , nausea or vomiting or diarrhea   He received left shoulder intra-articular which caused hyperglycemia for a week    HOME DIABETES REGIMEN:  Novolin Mix (70/30) 14 units BID with Breakfast and supper      METER DOWNLOAD SUMMARY: 6/9- 06/19/2022  Overall Mean FS Glucose = 152 Standard Deviation = 36  BG Ranges: Low = 77 High = 220   Hypoglycemic Events/30 Days: BG < 50 = 0 Episodes of symptomatic severe hypoglycemia = 0     DIABETIC COMPLICATIONS: Microvascular complications:  CKD III Denies: neuropathy, retinopathy Last eye exam: Completed 07/23/2021   Macrovascular complications:  CHF, PVD Denies: CAD, CVA    HISTORY:  Past Medical  History:  Past Medical History:  Diagnosis Date   CHF (congestive heart failure) (HCC)    Deafness in left ear    ED (erectile dysfunction)    Hearing loss in right ear    Hyperlipidemia    Hypertension    PAD (peripheral artery disease) ([place])    Type II diabetes mellitus ([place])    Past Surgical History:  Past Surgical History:  Procedure Laterality Date   CATARACT EXTRACTION W/PHACO Left 03/11/2016   Procedure: CATARACT EXTRACTION PHACO AND INTRAOCULAR LENS PLACEMENT ([place]);  Surgeon: Rutherford Guys, MD;  Location: [facility];  Service: Ophthalmology;  Laterality: Left;  CDE:2.71   CATARACT EXTRACTION W/PHACO Right 05/20/2016   Procedure: CATARACT EXTRACTION PHACO AND INTRAOCULAR LENS PLACEMENT ([facility]);  Surgeon: Rutherford Guys, MD;  Location: [facility];  Service: Ophthalmology;  Laterality: Right;  CDE: 4.33   COLONOSCOPY N/A 01/20/2020   Procedure: COLONOSCOPY;  Surgeon: Danie Binder, MD;  Location: [facility];  Service: Endoscopy;  Laterality: N/A;  8:30am   POLYPECTOMY  01/20/2020   Procedure: POLYPECTOMY;  Surgeon: Danie Binder, MD;  Location: [facility];  Service: Endoscopy;;   RETINAL LASER PROCEDURE     VASECTOMY     YAG LASER APPLICATION Right 04/24/622   Procedure: YAG LASER APPLICATION;  Surgeon: Rutherford Guys, MD;  Location: [facility];  Service: Ophthalmology;  Laterality: Right;   Social History:  reports that he has quit smoking. His smoking use  included cigarettes. He has a 17.50 pack-year smoking history. He has never used smokeless tobacco. He reports that he does not drink alcohol and does not use drugs. Family History:  Family History  Problem Relation Age of Onset   Hypertension Mother    Stroke Mother    Heart disease Father    Other Neg Hx        gynecomastia   Colon cancer Neg Hx    Colon polyps Neg Hx      HOME MEDICATIONS: Allergies as of 06/19/2022       Reactions   Atenolol Shortness Of Breath   Amlodipine Besy-benazepril Hcl Other (See Comments)    Headaches        Medication List        Accurate as of June 19, 2022  9:39 AM. If you have any questions, ask your nurse or doctor.          Alpha-Lipoic Acid 200 MG Caps Take 200 mg by mouth daily.   amLODipine 10 MG tablet Commonly known as: NORVASC TAKE 1 TABLET BY MOUTH ONCE DAILY   aspirin EC 81 MG tablet Take 1 tablet (81 mg total) by mouth daily.   atorvastatin 80 MG tablet Commonly known as: LIPITOR Take 1 tablet (80 mg total) by mouth daily. NEEDS FOLLOW UP APPOINTMENT FOR ANYMORE REFILLS   carvedilol 25 MG tablet Commonly known as: COREG Take 1 tablet (25 mg total) by mouth 2 (two) times daily with a meal. NEEDS FOLLOW UP APPOINTMENT FOR MORE REFILLS   empagliflozin 10 MG Tabs tablet Commonly known as: Jardiance Take 1 tablet (10 mg total) by mouth daily before breakfast.   Entresto 49-51 MG Generic drug: sacubitril-valsartan Take 1 tablet by mouth 2 (two) times daily. Need to make an appointment for further refills   ezetimibe 10 MG tablet Commonly known as: ZETIA TAKE 1 TABLET BY MOUTH ONCE DAILY.   fenofibrate 145 MG tablet Commonly known as: TRICOR Take 1 tablet (145 mg total) by mouth daily. NEEDS FOLLOW UP APPOINTMENT FOR ANYMORE REFILLS   folic acid 856 MCG tablet Commonly known as: FOLVITE Take 400 mcg by mouth daily.   hydrALAZINE 100 MG tablet Commonly known as: APRESOLINE TAKE 1 TABLET BY MOUTH 3 TIMES DAILY.   isosorbide mononitrate 60 MG 24 hr tablet Commonly known as: IMDUR TAKE 1 TABLET IN THE MORNING AND 1 TABLET AT BEDTIME.   latanoprost 0.005 % ophthalmic solution Commonly known as: XALATAN Place 1 drop into both eyes at bedtime.   NovoLIN 70/30 ReliOn (70-30) 100 UNIT/ML injection Generic drug: insulin NPH-regular Human Inject 14 Units into the skin 2 (two) times daily with a meal.   spironolactone 25 MG tablet Commonly known as: ALDACTONE TAKE (1/2) TABLET BY MOUTH DAILY.   vitamin B-12 1000 MCG tablet Commonly  known as: CYANOCOBALAMIN Take 1,000 mcg by mouth daily.         OBJECTIVE:   Vital Signs: BP 124/76 (BP Location: Left Arm, Patient Position: Sitting)   Pulse 85   Ht 5' 5.5" (1.664 m)   Wt 185 lb 3.2 oz (84 kg)   SpO2 97%   BMI 30.35 kg/m   Wt Readings from Last 3 Encounters:  06/19/22 185 lb 3.2 oz (84 kg)  12/12/21 181 lb 9.6 oz (82.4 kg)  06/12/21 182 lb (82.6 kg)     Exam: General: Pt appears well and is in NAD  Lungs: Clear with good BS bilat with no rales, rhonchi, or wheezes  Heart: RRR   Extremities: Trace pretibial edema.   Neuro: MS is good with appropriate affect, pt is alert and Ox3     DM foot exam: 06/19/2022 The skin of the feet is  without sores or ulcerations.Bilateral planter callous formation noted and thickened, discolored toe nails The pedal pulses are 1 + bilaterally  The sensation is absent   to a screening 5.07, 10 gram monofilament bilaterally            DATA REVIEWED:  Lab Results  Component Value Date   HGBA1C 7.0 (A) 06/19/2022   HGBA1C 7.2 (A) 12/12/2021   HGBA1C 7.3 (A) 06/12/2021    Latest Reference Range & Units 12/12/21 09:56  Sodium 135 - 145 mEq/L 138  Potassium 3.5 - 5.1 mEq/L 5.0  Chloride 96 - 112 mEq/L 109  CO2 19 - 32 mEq/L 25  Glucose 70 - 99 mg/dL 159 (H)  BUN 6 - 23 mg/dL 28 (H)  Creatinine 0.40 - 1.50 mg/dL 2.22 (H)  Calcium 8.4 - 10.5 mg/dL 8.9  GFR >60.00 mL/min 30.21 (L)  Total CHOL/HDL Ratio  4  Cholesterol 0 - 200 mg/dL 103  HDL Cholesterol >39.00 mg/dL 25.70 (L)  LDL (calc) 0 - 99 mg/dL 57  MICROALB/CREAT RATIO 0.0 - 30.0 mg/g 2.3  NonHDL  76.83  Triglycerides 0.0 - 149.0 mg/dL 97.0  VLDL 0.0 - 40.0 mg/dL 19.4  Creatinine,U mg/dL 91.1  Microalb, Ur 0.0 - 1.9 mg/dL 2.1 (H)    ASSESSMENT / PLAN / RECOMMENDATIONS:   1) Type 2 Diabetes Mellitus, OPtimally controlled, With CKD III complications and PVD - Most recent A1c of 7.0 %. Goal A1c < 7.0 %.   -His A1c continues to be acceptable - Dexcom  has been cost prohibitive - Freestyle libre was inaccurate with 40-60 points lower then finger stick - We discussed switching to insulin pens as they are a safer and easier alternative but he declines and would like to stick with vials due to cost  - Historically unable to add glycemic agents due to concerns about the cost.  - We discussed avoiding CHO intake for 36 hr post intra-articular injection to prevent hyperglycemia    MEDICATIONS: - Continue  Novolin Mix (70/30) 14 units with Breakfast and with supper    EDUCATION / INSTRUCTIONS: BG monitoring instructions: Patient is instructed to check his blood sugars 2 times a day, before breakfast and supper. Call [facility] if: BG persistently < 70 I reviewed the Rule of 15 for the treatment of hypoglycemia in detail with the patient. Literature supplied.    2) Dyslipidemia:  - LDL at goal , no changes    Medication Continue Atorvastatin 80 mg daily  F/U  6 months     Signed electronically by: Mack Guise, MD  [facility] Endocrinology  [facility] [address] [place], [place] [address] Phone: [phone] FAX: [phone]   CC: Glenda Chroman, MD [place] [address] Phone: [phone]  Fax: [phone]  Return to Endocrinology clinic as below: No future appointments.

## 2022-07-07 ENCOUNTER — Other Ambulatory Visit (HOSPITAL_COMMUNITY): Payer: Self-pay | Admitting: Cardiology

## 2022-07-14 ENCOUNTER — Other Ambulatory Visit (HOSPITAL_COMMUNITY): Payer: Self-pay | Admitting: Cardiology

## 2022-07-17 ENCOUNTER — Other Ambulatory Visit (HOSPITAL_COMMUNITY): Payer: Self-pay | Admitting: Cardiology

## 2022-07-30 DIAGNOSIS — H524 Presbyopia: Secondary | ICD-10-CM | POA: Diagnosis not present

## 2022-07-30 DIAGNOSIS — Z794 Long term (current) use of insulin: Secondary | ICD-10-CM | POA: Diagnosis not present

## 2022-07-30 DIAGNOSIS — E119 Type 2 diabetes mellitus without complications: Secondary | ICD-10-CM | POA: Diagnosis not present

## 2022-07-30 DIAGNOSIS — Z961 Presence of intraocular lens: Secondary | ICD-10-CM | POA: Diagnosis not present

## 2022-07-30 DIAGNOSIS — H5202 Hypermetropia, left eye: Secondary | ICD-10-CM | POA: Diagnosis not present

## 2022-07-30 DIAGNOSIS — H5211 Myopia, right eye: Secondary | ICD-10-CM | POA: Diagnosis not present

## 2022-07-30 DIAGNOSIS — H401132 Primary open-angle glaucoma, bilateral, moderate stage: Secondary | ICD-10-CM | POA: Diagnosis not present

## 2022-07-30 DIAGNOSIS — H52203 Unspecified astigmatism, bilateral: Secondary | ICD-10-CM | POA: Diagnosis not present

## 2022-08-11 ENCOUNTER — Other Ambulatory Visit (HOSPITAL_COMMUNITY): Payer: Self-pay | Admitting: Cardiology

## 2022-08-22 ENCOUNTER — Other Ambulatory Visit (HOSPITAL_COMMUNITY): Payer: Self-pay

## 2022-08-22 MED ORDER — ENTRESTO 49-51 MG PO TABS: 1.0000 | ORAL_TABLET | Freq: Two times a day (BID) | ORAL | 3 refills | Status: DC

## 2022-08-26 DIAGNOSIS — E1142 Type 2 diabetes mellitus with diabetic polyneuropathy: Secondary | ICD-10-CM | POA: Diagnosis not present

## 2022-08-26 DIAGNOSIS — L84 Corns and callosities: Secondary | ICD-10-CM | POA: Diagnosis not present

## 2022-08-26 DIAGNOSIS — M79676 Pain in unspecified toe(s): Secondary | ICD-10-CM | POA: Diagnosis not present

## 2022-08-26 DIAGNOSIS — B351 Tinea unguium: Secondary | ICD-10-CM | POA: Diagnosis not present

## 2022-09-02 DIAGNOSIS — E7800 Pure hypercholesterolemia, unspecified: Secondary | ICD-10-CM | POA: Diagnosis not present

## 2022-09-02 DIAGNOSIS — Z1331 Encounter for screening for depression: Secondary | ICD-10-CM | POA: Diagnosis not present

## 2022-09-02 DIAGNOSIS — Z0000 Encounter for general adult medical examination without abnormal findings: Secondary | ICD-10-CM | POA: Diagnosis not present

## 2022-09-02 DIAGNOSIS — Z6831 Body mass index (BMI) 31.0-31.9, adult: Secondary | ICD-10-CM | POA: Diagnosis not present

## 2022-09-02 DIAGNOSIS — R5383 Other fatigue: Secondary | ICD-10-CM | POA: Diagnosis not present

## 2022-09-02 DIAGNOSIS — Z1339 Encounter for screening examination for other mental health and behavioral disorders: Secondary | ICD-10-CM | POA: Diagnosis not present

## 2022-09-02 DIAGNOSIS — Z79899 Other long term (current) drug therapy: Secondary | ICD-10-CM | POA: Diagnosis not present

## 2022-09-02 DIAGNOSIS — E1165 Type 2 diabetes mellitus with hyperglycemia: Secondary | ICD-10-CM | POA: Diagnosis not present

## 2022-09-02 DIAGNOSIS — Z7189 Other specified counseling: Secondary | ICD-10-CM | POA: Diagnosis not present

## 2022-09-02 DIAGNOSIS — I10 Essential (primary) hypertension: Secondary | ICD-10-CM | POA: Diagnosis not present

## 2022-09-02 DIAGNOSIS — Z299 Encounter for prophylactic measures, unspecified: Secondary | ICD-10-CM | POA: Diagnosis not present

## 2022-09-03 DIAGNOSIS — Z79899 Other long term (current) drug therapy: Secondary | ICD-10-CM | POA: Diagnosis not present

## 2022-09-03 DIAGNOSIS — E7800 Pure hypercholesterolemia, unspecified: Secondary | ICD-10-CM | POA: Diagnosis not present

## 2022-09-03 DIAGNOSIS — R5383 Other fatigue: Secondary | ICD-10-CM | POA: Diagnosis not present

## 2022-09-03 DIAGNOSIS — Z125 Encounter for screening for malignant neoplasm of prostate: Secondary | ICD-10-CM | POA: Diagnosis not present

## 2022-09-18 ENCOUNTER — Other Ambulatory Visit (HOSPITAL_COMMUNITY): Payer: Self-pay | Admitting: Cardiology

## 2022-10-20 ENCOUNTER — Other Ambulatory Visit (HOSPITAL_COMMUNITY): Payer: Self-pay | Admitting: Cardiology

## 2022-10-30 ENCOUNTER — Ambulatory Visit (HOSPITAL_COMMUNITY)
Admission: RE | Admit: 2022-10-30 | Discharge: 2022-10-30 | Disposition: A | Discharge status: Home / Self Care | Payer: HMO | Source: Ambulatory Visit | Attending: Cardiology | Admitting: Cardiology

## 2022-10-30 VITALS — BP 142/78 | HR 88 | Wt 188.0 lb

## 2022-10-30 DIAGNOSIS — N183 Chronic kidney disease, stage 3 unspecified: Secondary | ICD-10-CM | POA: Diagnosis not present

## 2022-10-30 DIAGNOSIS — E785 Hyperlipidemia, unspecified: Secondary | ICD-10-CM | POA: Diagnosis not present

## 2022-10-30 DIAGNOSIS — Z7984 Long term (current) use of oral hypoglycemic drugs: Secondary | ICD-10-CM | POA: Insufficient documentation

## 2022-10-30 DIAGNOSIS — I130 Hypertensive heart and chronic kidney disease with heart failure and stage 1 through stage 4 chronic kidney disease, or unspecified chronic kidney disease: Secondary | ICD-10-CM | POA: Diagnosis present

## 2022-10-30 DIAGNOSIS — I5022 Chronic systolic (congestive) heart failure: Secondary | ICD-10-CM | POA: Diagnosis not present

## 2022-10-30 DIAGNOSIS — Z794 Long term (current) use of insulin: Secondary | ICD-10-CM | POA: Insufficient documentation

## 2022-10-30 DIAGNOSIS — I739 Peripheral vascular disease, unspecified: Secondary | ICD-10-CM

## 2022-10-30 DIAGNOSIS — E1122 Type 2 diabetes mellitus with diabetic chronic kidney disease: Secondary | ICD-10-CM | POA: Diagnosis not present

## 2022-10-30 DIAGNOSIS — E782 Mixed hyperlipidemia: Secondary | ICD-10-CM

## 2022-10-30 DIAGNOSIS — I5042 Chronic combined systolic (congestive) and diastolic (congestive) heart failure: Secondary | ICD-10-CM | POA: Insufficient documentation

## 2022-10-30 DIAGNOSIS — Z79899 Other long term (current) drug therapy: Secondary | ICD-10-CM | POA: Diagnosis not present

## 2022-10-30 DIAGNOSIS — R5383 Other fatigue: Secondary | ICD-10-CM | POA: Insufficient documentation

## 2022-10-30 LAB — LIPID PANEL
Cholesterol: 105 mg/dL (ref 0–200)
HDL: 25 mg/dL — ABNORMAL LOW (ref >40)
LDL Cholesterol: 62 mg/dL (ref 0–99)
Total CHOL/HDL Ratio: 4.2 RATIO
Triglycerides: 89 mg/dL (ref <150)
VLDL: 18 mg/dL (ref 0–40)

## 2022-10-30 LAB — CBC
HCT: 35.7 % — ABNORMAL LOW (ref 39.0–52.0)
Hemoglobin: 11.5 g/dL — ABNORMAL LOW (ref 13.0–17.0)
MCH: 29 pg (ref 26.0–34.0)
MCHC: 32.2 g/dL (ref 30.0–36.0)
MCV: 90.2 fL (ref 80.0–100.0)
Platelets: 278 10*3/uL (ref 150–400)
RBC: 3.96 MIL/uL — ABNORMAL LOW (ref 4.22–5.81)
RDW: 14.5 % (ref 11.5–15.5)
WBC: 9.2 10*3/uL (ref 4.0–10.5)
nRBC: 0 % (ref 0.0–0.2)

## 2022-10-30 LAB — BASIC METABOLIC PANEL
Anion gap: 5 (ref 5–15)
BUN: 32 mg/dL — ABNORMAL HIGH (ref 8–23)
CO2: 24 mmol/L (ref 22–32)
Calcium: 9 mg/dL (ref 8.9–10.3)
Chloride: 111 mmol/L (ref 98–111)
Creatinine, Ser: 2.28 mg/dL — ABNORMAL HIGH (ref 0.61–1.24)
GFR, Estimated: 31 mL/min — ABNORMAL LOW (ref >60)
Glucose, Bld: 118 mg/dL — ABNORMAL HIGH (ref 70–99)
Potassium: 4.7 mmol/L (ref 3.5–5.1)
Sodium: 140 mmol/L (ref 135–145)

## 2022-10-30 LAB — BRAIN NATRIURETIC PEPTIDE: B Natriuretic Peptide: 54.2 pg/mL (ref 0.0–100.0)

## 2022-10-30 NOTE — Progress Notes (Signed)
Patient ID: Derek Mckenzie, male   DOB: 04-18-1956, 66 y.o.   MRN: [id number] PCP: Dr. Woody Seller HF Cardiology: Dr. Aundra Dubin  66 y.o. with history of HTN, DM, PAD, CKD and systolic CHF presents for CHF clinic followup.  He has had a long history of difficult-to-control HTN.  Medications were adjusted and repeat echo in 12/17 showed improvement in EF to 60-65%.  He has known PAD, significant on 12/17 peripheral arterial dopplers.  He saw Dr. Gwenlyn Found for evaluation and conservative treatment for now was recommended.  Echo in 12/18 showed EF 60-65%, moderate LVH.  Echo in 11/20 with EF 60-65%, moderate LVH, normal RV.   Echo in 5/22 showed EF 60-65% with moderate LVH and normal RV.   Patient returns for followup of CHF and HTN. Very hard of hearing.  Weight is up 3 lbs.  BP is mildly elevated today but he says that SBP runs in 120s when he checks at home.  Unfortunately, his wife passed away last weekend.  He is grieving.  He denies chest pain or exertional dyspnea.  Main complaint is fatigue/tiredness in his calves when he walks.  He has a history of PAD that has been managed conservatively.  No rest pain or pedal ulcers. No lightheadedness or syncope.   ECG (personally reviewed): NSR, nonspecific lateral/anterolateral TWIs  Labs (11/16): LDL 109, HDL 26, K 4.1, creatinine 1.42, BNP 197, TSH normal Labs (3/17): K 4.2, creatinine 1.73 Labs (12/17): K 5.4, creatinine 1.5, TGs 448, unable to calculate LDL, BNP 51, hgb 12.4 Labs (1/18): K 5.1, creatinine 1.62 Labs (7/18): LDL 84, HDL 21, TGs 215 Labs (8/18): K 5.1, creatinine 1.84 Labs (10/18): LDL 64, HDL 22, LFTs normal Labs (12/18): K 4.8, creatinine 1.85 Labs (1/20): K 5.1, creatinine 1.82 Labs (10/21): LDL 82, HDL 27, K 4.1, creatinine 1.75, LFTs normal Labs (2/22): LDL 53, TGs 103 Labs (3/22): K 4.3, creatinine 1.98 Labs (12/22): HDL 57, HDL 56, K 5, creatinine 2.22  PMH:  1. Type II diabetes. 2. HTN: x years, poorly controlled. 11/16 renal  artery dopplers with no significant stenosis.  3. Deafness 4. PAD: 2012 peripheral arterial dopplers with right distal SFA stenosis and left mid SFA stenosis.  - Peripheral arterial dopplers (12/17): Probable right iliac obstruction and distal left SFA obstruction.  - ABIs (12/18): 0.61 right, 0.9 left (improved).  - ABIs (1/20): 0.62 right, 0.85 left - ABIs (12/21): moderate right PAD, no changes.  5. Sleep study negative for OSA 6. Chronic systolic CHF: Echo (30/07) with EF 30-35%, moderate LVH.  Possible hypertensive cardiomyopathy.  - Echo (12/17): EF 60-65%.  - Echo (12/18): EF 60-65%, moderate LVH.  - Echo (11/20): EF 60-65%, moderate LVH, normal RV.  - Echo (5/22): EF 60-65% with moderate LVH and normal RV 7. CKD: Stage 3.  8. Hyperlipidemia  FH: No cardiac disease that he knows of.  +HTN.   Social History   Socioeconomic History   Marital status: Single    Spouse name: Not on file   Number of children: Not on file   Years of education: Not on file   Highest education level: Not on file  Occupational History   Not on file  Tobacco Use   Smoking status: Former    Packs/day: 0.50    Years: 35.00    Total pack years: 17.50    Types: Cigarettes   Smokeless tobacco: Never   Tobacco comments:    "quit smoking in ~ 2007"  Substance and Sexual Activity  Alcohol use: No   Drug use: No   Sexual activity: Never  Other Topics Concern   Not on file  Social History Narrative   IN A LONG TERM PLATONIC RELATIONSHIP. BEEN GOOD FRIENDS FOR YEARS AND TAKING CARE OF EACH OTHER. SEES KIDS VERY RARELY. 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 HEARING AID DID NOT WORK.   Social Determinants of Health   Financial Resource Strain: Not on file  Food Insecurity: Not on file  Transportation Needs: Not on file  Physical Activity: Not on file  Stress: Not on file  Social Connections: Not on file  Intimate Partner Violence: Not on file   ROS: All systems reviewed and  negative except as per HPI.   Current Outpatient Medications  Medication Sig Dispense Refill   Alpha-Lipoic Acid 200 MG CAPS Take 200 mg by mouth daily.      amLODipine (NORVASC) 10 MG tablet TAKE 1 TABLET BY MOUTH ONCE DAILY 30 tablet 3   atorvastatin (LIPITOR) 80 MG tablet TAKE 1 TABLET BY MOUTH ONCE DAILY. 30 tablet 11   carvedilol (COREG) 25 MG tablet TAKE 1 TABLET 2 TIMES A DAY WITH A MEAL. 60 tablet 0   empagliflozin (JARDIANCE) 10 MG TABS tablet Take 1 tablet (10 mg total) by mouth daily before breakfast. NEEDS FOLLOW UP APPOINTMENT FOR ANYMORE REFILL. 60 tablet 0   ezetimibe (ZETIA) 10 MG tablet TAKE 1 TABLET BY MOUTH ONCE DAILY. 30 tablet 11   fenofibrate (TRICOR) 145 MG tablet Take 1 tablet (145 mg total) by mouth daily. 90 tablet 3   folic acid (FOLVITE) 932 MCG tablet Take 400 mcg by mouth daily.      hydrALAZINE (APRESOLINE) 100 MG tablet TAKE 1 TABLET BY MOUTH 3 TIMES DAILY. 90 tablet 11   insulin NPH-regular Human (NOVOLIN 70/30 RELION) (70-30) 100 UNIT/ML injection Inject 14 Units into the skin 2 (two) times daily with a meal. 30 mL 3   isosorbide mononitrate (IMDUR) 60 MG 24 hr tablet TAKE 1 TABLET IN THE MORNING AND 1 TABLET AT BEDTIME. 180 tablet 3   latanoprost (XALATAN) 0.005 % ophthalmic solution Place 1 drop into both eyes at bedtime.     sacubitril-valsartan (ENTRESTO) 49-51 MG Take 1 tablet by mouth 2 (two) times daily. Must keep appointments for further refills 60 tablet 3   spironolactone (ALDACTONE) 25 MG tablet TAKE (1/2) TABLET BY MOUTH DAILY. 15 tablet 11   vitamin B-12 (CYANOCOBALAMIN) 1000 MCG tablet Take 1,000 mcg by mouth daily.      No current facility-administered medications for this encounter.   BP (!) 142/78   Pulse 88   Wt 85.3 kg (188 lb)   SpO2 98%   BMI 30.81 kg/m  General: NAD Neck: No JVD, no thyromegaly or thyroid nodule.  Lungs: Clear to auscultation bilaterally with normal respiratory effort. CV: Nondisplaced PMI.  Heart regular S1/S2,  no S3/S4, no murmur.  1+ ankle edema.  No carotid bruit.  Unable to palpate pedal pulses.  Abdomen: Soft, nontender, no hepatosplenomegaly, no distention.  Skin: Intact without lesions or rashes.  Neurologic: Alert and oriented x 3.  Psych: Normal affect. Extremities: No clubbing or cyanosis.  HEENT: Normal.   Assessment/Plan: 1. Chronic systolic => diastolic CHF: Echo 67/12 with EF 30-35%.  Given improvement in EF to 60-65% with BP control, suspect this was a hypertensive cardiomyopathy. Has moderate LVH on echo.  NYHA class I-II, but he is limited by leg weakness/fatigue that is likely  a form of claudication.   - Continue, Entresto, hydralazine/Imdur, spironolactone, Jardiance, and Coreg.  BMET/BNP today.  2. HTN: Renal arterial dopplers in 11/16 did not show evidence for renal artery stenosis. Moderate LVH on 5/22 echo.  BP controlled on current regimen.  3. PAD: Last ABIs abnormal in 12/21 but stable.  He has been getting leg fatigue/tiredness with exertion recently.  No rest pain or pedal ulcerations.   - Check peripheral arterial dopplers.  Would need high threshold for intervention with significant CKD.  If more markedly abnormal, will make PV referral. Would be helpful to keep his vascular care within our system as it has been in the past rather than fragmenting care (apparently had ABIs done at [facility] by PCP but I cannot review).   - Continue ASA 81 and statin.   4. Hyperlipidemia:  Check lipids today.    5. CKD: Stage 3.   - BMET today.  - Continue Jardiance.   Followup 6 months.    Loralie Champagne 10/30/2022

## 2022-10-30 NOTE — Patient Instructions (Signed)
There has been no changes to your medications.  Labs done today, your results will be available in MyChart, we will contact you for abnormal readings.  Your provider has ordered Peripheral Dopplers at the [facility] office. They will call you to arrange the appointment.  Your physician recommends that you schedule a follow-up appointment in: 6 months ( May 2024) ** please call the office in February to arrange your appointment **  If you have any questions or concerns before your next appointment please send [place] a message through [place] or call our office at [phone].    TO LEAVE A MESSAGE FOR THE NURSE SELECT OPTION 2, PLEASE LEAVE A MESSAGE INCLUDING: YOUR NAME DATE OF BIRTH CALL BACK NUMBER REASON FOR CALL**this is important as we prioritize the call backs  YOU WILL RECEIVE A CALL BACK THE SAME DAY AS LONG AS YOU CALL BEFORE 4:00 PM  At the [facility], you and your health needs are our priority. As part of our continuing mission to provide you with exceptional heart care, we have created designated Provider Care Teams. These Care Teams include your primary Cardiologist (physician) and Advanced Practice Providers (APPs- Physician Assistants and Nurse Practitioners) who all work together to provide you with the care you need, when you need it.   You may see any of the following providers on your designated Care Team at your next follow up: Dr Glori Bickers Dr Loralie Champagne Dr. Roxana Hires, NP Lyda Jester, [place] [facility] [place], [place] Forestine Na, NP Audry Riles, PharmD   Please be sure to bring in all your medications bottles to every appointment.

## 2022-11-03 ENCOUNTER — Ambulatory Visit: Payer: HMO | Attending: Cardiology

## 2022-11-03 DIAGNOSIS — I739 Peripheral vascular disease, unspecified: Secondary | ICD-10-CM | POA: Diagnosis not present

## 2022-11-11 ENCOUNTER — Encounter (HOSPITAL_COMMUNITY): Payer: Self-pay

## 2022-11-12 ENCOUNTER — Telehealth (HOSPITAL_COMMUNITY): Payer: Self-pay

## 2022-11-12 NOTE — Telephone Encounter (Signed)
Advanced Heart Failure Patient Advocate Encounter   Received renewal notification for [employer]). This patient is also currently taking Jardiance [employer]) and is eligible for a grant that is currently open and would cover the cost of both medications.  Left voicemail for patient to call back to start application process.   Clista Bernhardt, CPhT Rx Patient Advocate Phone: [phone]

## 2022-11-18 ENCOUNTER — Other Ambulatory Visit (HOSPITAL_COMMUNITY): Payer: Self-pay | Admitting: Cardiology

## 2022-11-24 ENCOUNTER — Telehealth (HOSPITAL_COMMUNITY): Payer: Self-pay | Admitting: Cardiology

## 2022-11-24 DIAGNOSIS — I739 Peripheral vascular disease, unspecified: Secondary | ICD-10-CM

## 2022-11-24 NOTE — Telephone Encounter (Signed)
ABI results reviewed with patient Referral placed

## 2022-11-24 NOTE — Telephone Encounter (Signed)
Advanced Heart Failure Patient Advocate Encounter  Second attempt to reach patient. Renewal form that was mailed to patient has been filled our and dropped off with [facility].  Left voicemail, unable to confirm preference for renewal or grant at this time.

## 2022-12-05 NOTE — Telephone Encounter (Signed)
Advanced Heart Failure Patient Advocate Encounter  Left voicemail for patient to call back

## 2022-12-08 ENCOUNTER — Other Ambulatory Visit (HOSPITAL_COMMUNITY): Payer: Self-pay | Admitting: Cardiology

## 2022-12-08 NOTE — Telephone Encounter (Signed)
Advanced Heart Failure Patient Advocate Encounter  No response from patient at this time. Submitted renewal forms to [employer] on 12/08/22. Application forms attached to patient chart.

## 2022-12-09 ENCOUNTER — Encounter: Payer: Self-pay | Admitting: Internal Medicine

## 2022-12-09 ENCOUNTER — Ambulatory Visit (INDEPENDENT_AMBULATORY_CARE_PROVIDER_SITE_OTHER): Payer: HMO | Admitting: Internal Medicine

## 2022-12-09 ENCOUNTER — Other Ambulatory Visit (HOSPITAL_COMMUNITY): Payer: Self-pay | Admitting: Cardiology

## 2022-12-09 VITALS — BP 120/70 | HR 82 | Ht 65.5 in | Wt 186.0 lb

## 2022-12-09 DIAGNOSIS — E1122 Type 2 diabetes mellitus with diabetic chronic kidney disease: Secondary | ICD-10-CM | POA: Diagnosis not present

## 2022-12-09 DIAGNOSIS — E1142 Type 2 diabetes mellitus with diabetic polyneuropathy: Secondary | ICD-10-CM | POA: Diagnosis not present

## 2022-12-09 DIAGNOSIS — E785 Hyperlipidemia, unspecified: Secondary | ICD-10-CM | POA: Diagnosis not present

## 2022-12-09 DIAGNOSIS — N1832 Chronic kidney disease, stage 3b: Secondary | ICD-10-CM

## 2022-12-09 DIAGNOSIS — Z794 Long term (current) use of insulin: Secondary | ICD-10-CM | POA: Diagnosis not present

## 2022-12-09 DIAGNOSIS — E118 Type 2 diabetes mellitus with unspecified complications: Secondary | ICD-10-CM

## 2022-12-09 LAB — POCT GLYCOSYLATED HEMOGLOBIN (HGB A1C): Hemoglobin A1C: 6.1 % — AB (ref 4.0–5.6)

## 2022-12-09 MED ORDER — NOVOLIN 70/30 RELION (70-30) 100 UNIT/ML ~~LOC~~ SUSP: 14.0000 [IU] | Freq: Two times a day (BID) | SUBCUTANEOUS | 3 refills | Status: DC

## 2022-12-09 NOTE — Progress Notes (Signed)
Name: Derek Mckenzie  Age/ Sex: 66 y.o., male   MRN/ DOB: [id number], September 22, 1956     PCP: Glenda Chroman, MD   Reason for Endocrinology Evaluation: Type 2 Diabetes Mellitus  Initial Endocrine Consultative Visit: 05/16/2019    PATIENT IDENTIFIER: Mr. Derek Mckenzie is a 66 y.o. male with a past medical history of HTN, T2DM, cardiomyopathy and dyslipidemia. The patient has followed with Endocrinology clinic since 05/16/2019 for consultative assistance with management of his diabetes.  DIABETIC HISTORY:  Mr. Derek Mckenzie was diagnosed with T2DM > 20 yrs ago. He does not recall prior oral glycemic agents, he has been on insulin for years.  His hemoglobin A1c has ranged from 12.2% in 2016, peaking at 13.1% in  2019   On his initial visit to our clinic, his A1c was 12.2% and he was on Novolin- N only. We started him on Novolin- Mix   He declined switching to Vials in 2022  SUBJECTIVE:   During the last visit (06/19/2022): A1c 7.2% . We adjusted novolin- Mix (70/30)      Today (12/09/2022): Mr. Derek Mckenzie is here for a  follow up on diabetes management.  He checks his blood sugars 2 times daily, preprandial to breakfast and supper.  The patient has has  had hypoglycemic episodes since the last clinic visit.   He had an eye exam for evaluation of glaucoma by Dr. Gershon Crane 12/01/2022 He was seen by podiatry 08/26/2022 He is  on Jardiance through cardiology  He denies any sob or abdominal pain  Has right foot stabbing pains at times   HOME DIABETES REGIMEN:  Novolin Mix (70/30) 14 units BID with Breakfast and supper   Jardiance 10 mg daily - per cardio   METER DOWNLOAD SUMMARY: 11/29-12/11/2022  Overall Mean FS Glucose = 142 Standard Deviation = 42  BG Ranges: Low = 64 High = 265   Hypoglycemic Events/30 Days: BG < 50 = 0 Episodes of symptomatic severe hypoglycemia = 0     DIABETIC COMPLICATIONS: Microvascular complications:  CKD III Denies: neuropathy,  retinopathy Last eye exam: Completed 12/01/2022   Macrovascular complications:  CHF, PVD Denies: CAD, CVA    HISTORY:  Past Medical History:  Past Medical History:  Diagnosis Date   CHF (congestive heart failure) (HCC)    Deafness in left ear    ED (erectile dysfunction)    Hearing loss in right ear    Hyperlipidemia    Hypertension    PAD (peripheral artery disease) ([place])    Type II diabetes mellitus ([place])    Past Surgical History:  Past Surgical History:  Procedure Laterality Date   CATARACT EXTRACTION W/PHACO Left 03/11/2016   Procedure: CATARACT EXTRACTION PHACO AND INTRAOCULAR LENS PLACEMENT ([place]);  Surgeon: Rutherford Guys, MD;  Location: [facility];  Service: Ophthalmology;  Laterality: Left;  CDE:2.71   CATARACT EXTRACTION W/PHACO Right 05/20/2016   Procedure: CATARACT EXTRACTION PHACO AND INTRAOCULAR LENS PLACEMENT ([facility]);  Surgeon: Rutherford Guys, MD;  Location: [facility];  Service: Ophthalmology;  Laterality: Right;  CDE: 4.33   COLONOSCOPY N/A 01/20/2020   Procedure: COLONOSCOPY;  Surgeon: Danie Binder, MD;  Location: [facility];  Service: Endoscopy;  Laterality: N/A;  8:30am   POLYPECTOMY  01/20/2020   Procedure: POLYPECTOMY;  Surgeon: Danie Binder, MD;  Location[facility];  Service: Endoscopy;;   RETINAL LASER PROCEDURE     VASECTOMY     YAG LASER APPLICATION Right 04/06/8118   Procedure: YAG LASER APPLICATION;  Surgeon: Rutherford Guys,  MD;  Location: [facility];  Service: Ophthalmology;  Laterality: Right;   Social History:  reports that he has quit smoking. His smoking use included cigarettes. He has a 17.50 pack-year smoking history. He has never used smokeless tobacco. He reports that he does not drink alcohol and does not use drugs. Family History:  Family History  Problem Relation Age of Onset   Hypertension Mother    Stroke Mother    Heart disease Father    Other Neg Hx        gynecomastia   Colon cancer Neg Hx    Colon polyps Neg Hx      HOME  MEDICATIONS: Allergies as of 12/09/2022       Reactions   Atenolol Shortness Of Breath   Amlodipine Besy-benazepril Hcl Other (See Comments)   Headaches        Medication List        Accurate as of December 09, 2022 10:39 AM. If you have any questions, ask your nurse or doctor.          Alpha-Lipoic Acid 200 MG Caps Take 200 mg by mouth daily.   amLODipine 10 MG tablet Commonly known as: NORVASC TAKE 1 TABLET BY MOUTH ONCE DAILY   atorvastatin 80 MG tablet Commonly known as: LIPITOR TAKE 1 TABLET BY MOUTH ONCE DAILY.   carvedilol 25 MG tablet Commonly known as: COREG TAKE 1 TABLET 2 TIMES A DAY WITH A MEAL.   cyanocobalamin 1000 MCG tablet Commonly known as: VITAMIN B12 Take 1,000 mcg by mouth daily.   empagliflozin 10 MG Tabs tablet Commonly known as: Jardiance Take 1 tablet (10 mg total) by mouth daily before breakfast. NEEDS FOLLOW UP APPOINTMENT FOR ANYMORE REFILL.   Entresto 49-51 MG Generic drug: sacubitril-valsartan Take 1 tablet by mouth 2 (two) times daily. Must keep appointments for further refills   ezetimibe 10 MG tablet Commonly known as: ZETIA TAKE 1 TABLET BY MOUTH ONCE DAILY.   fenofibrate 145 MG tablet Commonly known as: TRICOR Take 1 tablet (145 mg total) by mouth daily.   folic acid 128 MCG tablet Commonly known as: FOLVITE Take 400 mcg by mouth daily.   hydrALAZINE 100 MG tablet Commonly known as: APRESOLINE TAKE 1 TABLET BY MOUTH 3 TIMES DAILY.   isosorbide mononitrate 60 MG 24 hr tablet Commonly known as: IMDUR TAKE 1 TABLET IN THE MORNING AND 1 TABLET AT BEDTIME.   latanoprost 0.005 % ophthalmic solution Commonly known as: XALATAN Place 1 drop into both eyes at bedtime.   NovoLIN 70/30 ReliOn (70-30) 100 UNIT/ML injection Generic drug: insulin NPH-regular Human Inject 14 Units into the skin 2 (two) times daily with a meal.   spironolactone 25 MG tablet Commonly known as: ALDACTONE TAKE (1/2) TABLET BY MOUTH DAILY.          OBJECTIVE:   Vital Signs: BP 120/70 (BP Location: Left Arm, Patient Position: Sitting, Cuff Size: Large)   Pulse 82   Ht 5' 5.5" (1.664 m)   Wt 186 lb (84.4 kg)   SpO2 98%   BMI 30.48 kg/m   Wt Readings from Last 3 Encounters:  12/09/22 186 lb (84.4 kg)  10/30/22 188 lb (85.3 kg)  06/19/22 185 lb 3.2 oz (84 kg)     Exam: General: Pt appears well and is in NAD  Lungs: Clear with good BS bilat with no rales, rhonchi, or wheezes  Heart: RRR   Extremities: Trace pretibial edema.   Neuro: MS is good with appropriate  affect, pt is alert and Ox3     DM foot exam: 06/19/2022 The skin of the feet is  without sores or ulcerations.Bilateral planter callous formation noted and thickened, discolored toe nails The pedal pulses are 1 + bilaterally  The sensation is absent   to a screening 5.07, 10 gram monofilament bilaterally            DATA REVIEWED:  Lab Results  Component Value Date   HGBA1C 6.1 (A) 12/09/2022   HGBA1C 7.0 (A) 06/19/2022   HGBA1C 7.2 (A) 12/12/2021    10/30/2022 BUN 32 Cr. 2.280 GFR 59 HDL  25 LDL 62    ASSESSMENT / PLAN / RECOMMENDATIONS:   1) Type 2 Diabetes Mellitus, OPtimally controlled, With CKD III complications and PVD - Most recent A1c of 6.1 %. Goal A1c < 7.0 %.   - He has been noted with glycemic excursions ranging between 64- 265 mg/dL this is not uncommon with his current insulin regimen as its insulin mix. He had opted to remain on the current regimen  due to cost concerns including remaining on vials  - Dexcom has been cost prohibitive - Freestyle libre was inaccurate with 40-60 points lower then finger stick -Historically unable to add glycemic agents due to concerns about the cost, but he is on Jardiance through cardiology and seems to be doing well with it   MEDICATIONS: - Continue  Novolin Mix (70/30) 14 units with Breakfast and with supper    EDUCATION / INSTRUCTIONS: BG monitoring instructions: Patient is  instructed to check his blood sugars 2 times a day, before breakfast and supper. Call [place] clinic if: BG persistently < 70 I reviewed the Rule of 15 for the treatment of hypoglycemia in detail with the patient. Literature supplied.    2) Dyslipidemia:  - LDL at goal , no changes    Medication Continue Atorvastatin 80 mg daily  F/U  6 months     Signed electronically by: Mack Guise, MD  [facility]  [facility] [address] [place], [place] [address] Phone: [phone] FAX: [phone]   CC: Glenda Chroman, MD [place] [address] Phone: [phone]  Fax: [phone]  Return to Endocrinology clinic as below: No future appointments.

## 2022-12-09 NOTE — Patient Instructions (Signed)
-   Continue Novolin -Mix  14 units with Breakfast and 14  units with Supper     - HOW TO TREAT LOW BLOOD SUGARS (Blood sugar LESS THAN 70 MG/DL) Please follow the RULE OF 15 for the treatment of hypoglycemia treatment (when your (blood sugars are less than 70 mg/dL)   STEP 1: Take 15 grams of carbohydrates when your blood sugar is low, which includes:  3-4 GLUCOSE TABS  OR 3-4 OZ OF JUICE OR REGULAR SODA OR ONE TUBE OF GLUCOSE GEL    STEP 2: RECHECK blood sugar in 15 MINUTES STEP 3: If your blood sugar is still low at the 15 minute recheck --> then, go back to STEP 1 and treat AGAIN with another 15 grams of carbohydrates.

## 2022-12-15 NOTE — Telephone Encounter (Signed)
Advanced Heart Failure Patient Advocate Encounter  Patient was approved to receive Entresto from [employer] Effective 12/15/22 to 12/29/23 Determination letter has been added to patient chart.  Clista Bernhardt, CPhT Rx Patient Advocate Phone: [phone]

## 2022-12-17 ENCOUNTER — Other Ambulatory Visit (HOSPITAL_COMMUNITY): Payer: Self-pay | Admitting: Cardiology

## 2023-01-07 ENCOUNTER — Other Ambulatory Visit (HOSPITAL_COMMUNITY): Payer: Self-pay | Admitting: Cardiology

## 2023-01-12 ENCOUNTER — Other Ambulatory Visit: Payer: Self-pay | Admitting: Cardiology

## 2023-01-12 DIAGNOSIS — I739 Peripheral vascular disease, unspecified: Secondary | ICD-10-CM

## 2023-01-19 ENCOUNTER — Other Ambulatory Visit (HOSPITAL_COMMUNITY): Payer: Self-pay | Admitting: Cardiology

## 2023-01-27 ENCOUNTER — Other Ambulatory Visit (HOSPITAL_COMMUNITY): Payer: Self-pay | Admitting: Cardiology

## 2023-02-03 DIAGNOSIS — L281 Prurigo nodularis: Secondary | ICD-10-CM | POA: Diagnosis not present

## 2023-02-03 DIAGNOSIS — L2089 Other atopic dermatitis: Secondary | ICD-10-CM | POA: Diagnosis not present

## 2023-02-05 ENCOUNTER — Encounter: Payer: Self-pay | Admitting: Gastroenterology

## 2023-02-05 ENCOUNTER — Other Ambulatory Visit (HOSPITAL_COMMUNITY): Payer: Self-pay | Admitting: Cardiology

## 2023-02-05 ENCOUNTER — Ambulatory Visit (INDEPENDENT_AMBULATORY_CARE_PROVIDER_SITE_OTHER): Payer: PPO | Admitting: Gastroenterology

## 2023-02-05 VITALS — BP 130/65 | HR 86 | Temp 98.2°F | Ht 65.0 in | Wt 187.6 lb

## 2023-02-05 DIAGNOSIS — Z86010 Personal history of colonic polyps: Secondary | ICD-10-CM

## 2023-02-05 NOTE — Patient Instructions (Signed)
We are arranging a colonoscopy with Dr. Abbey Chatters in the near future!  Do not take Jardiance 3 days before the procedure.  No insulin the morning of the procedure.  Further recommendations to follow!  It was a pleasure to see you today. I want to create trusting relationships with patients and provide genuine, compassionate, and quality care. I truly value your feedback, so please be on the lookout for a survey regarding your visit with me today. I appreciate your time in completing this!    Annitta Needs, PhD, ANP-BC [facility]

## 2023-02-05 NOTE — Progress Notes (Signed)
     Gastroenterology Office Note    Referring Provider: Vyas, Dhruv B, MD Primary Care Physician:  Vyas, Dhruv B, MD  Primary GI: Dr. Carver    Chief Complaint   Chief Complaint  Patient presents with   Follow-up    Due for surveillance colonoscopy      History of Present Illness   Derek Mckenzie is a 66 y.o. male presenting today to arrange surveillance colonoscopy. He has a history of multiple polyps in 2021 and due for surveillance now.   No abdominal pain, N/V, changes in bowel habits, constipation, diarrhea, overt GI bleeding, GERD, dysphagia, unexplained weight loss, lack of appetite, unexplained weight gain.      Colonoscopy  2021: Seven 3-6 mm polyps in sigmoid, transverse, ascending colon, cecum, two 8-12 mm polyps in mid transverse, (two serrated polyps, six simple adenomas, one hyperplastic polyp).    Past Medical History:  Diagnosis Date   CHF (congestive heart failure) (HCC)    Deafness in left ear    ED (erectile dysfunction)    Hearing loss in right ear    Hyperlipidemia    Hypertension    PAD (peripheral artery disease) (HCC)    Type II diabetes mellitus (HCC)     Past Surgical History:  Procedure Laterality Date   CATARACT EXTRACTION W/PHACO Left 03/11/2016   Procedure: CATARACT EXTRACTION PHACO AND INTRAOCULAR LENS PLACEMENT ([facility]);  Surgeon: Mark Shapiro, MD;  Location: [facility];  Service: Ophthalmology;  Laterality: Left;  CDE:2.71   CATARACT EXTRACTION W/PHACO Right 05/20/2016   Procedure: CATARACT EXTRACTION PHACO AND INTRAOCULAR LENS PLACEMENT ([facility]);  Surgeon: Mark Shapiro, MD;  Location: [facility];  Service: Ophthalmology;  Laterality: Right;  CDE: 4.33   COLONOSCOPY N/A 01/20/2020   Procedure: COLONOSCOPY;  Surgeon: Fields, Sandi L, MD;  [facility];  Service: Endoscopy;  Laterality: N/A;  8:30am   POLYPECTOMY  01/20/2020   Procedure: POLYPECTOMY;  Surgeon: Fields, Sandi L, MD;  [facility];  Service: Endoscopy;;    RETINAL LASER PROCEDURE     VASECTOMY     YAG LASER APPLICATION Right 02/24/2017   Procedure: YAG LASER APPLICATION;  Surgeon: Mark Shapiro, MD;  Location: [facility];  Service: Ophthalmology;  Laterality: Right;    Current Outpatient Medications  Medication Sig Dispense Refill   Alpha-Lipoic Acid 200 MG CAPS Take 200 mg by mouth daily.      amLODipine (NORVASC) 10 MG tablet TAKE 1 TABLET BY MOUTH ONCE DAILY 30 tablet 3   atorvastatin (LIPITOR) 80 MG tablet TAKE 1 TABLET BY MOUTH ONCE DAILY. 30 tablet 11   carvedilol (COREG) 25 MG tablet TAKE 1 TABLET 2 TIMES A DAY WITH A MEAL. 60 tablet 0   ezetimibe (ZETIA) 10 MG tablet TAKE 1 TABLET BY MOUTH ONCE DAILY. 30 tablet 0   fenofibrate (TRICOR) 145 MG tablet Take 1 tablet (145 mg total) by mouth daily. 90 tablet 3   folic acid (FOLVITE) 400 MCG tablet Take 400 mcg by mouth daily.      hydrALAZINE (APRESOLINE) 100 MG tablet TAKE 1 TABLET BY MOUTH 3 TIMES DAILY. 90 tablet 0   insulin NPH-regular Human (NOVOLIN 70/30 RELION) (70-30) 100 UNIT/ML injection Inject 14 Units into the skin 2 (two) times daily with a meal. 30 mL 3   isosorbide mononitrate (IMDUR) 60 MG 24 hr tablet TAKE 1 TABLET IN THE MORNING AND 1 TABLET AT BEDTIME. 180 tablet 3   JARDIANCE 10 MG TABS tablet TAKE 1 TABLET   DAILY BEFORE BREAKFAST. 30 tablet 0   latanoprost (XALATAN) 0.005 % ophthalmic solution Place 1 drop into both eyes at bedtime.     sacubitril-valsartan (ENTRESTO) 49-51 MG Take 1 tablet by mouth 2 (two) times daily. Must keep appointments for further refills 60 tablet 3   spironolactone (ALDACTONE) 25 MG tablet TAKE (1/2) TABLET BY MOUTH DAILY. 15 tablet 0   vitamin B-12 (CYANOCOBALAMIN) 1000 MCG tablet Take 1,000 mcg by mouth daily.      No current facility-administered medications for this visit.    Allergies as of 02/05/2023 - Review Complete 02/05/2023  Allergen Reaction Noted   Atenolol Shortness Of Breath 10/31/2015   Amlodipine besy-benazepril hcl Other (See  Comments) 10/31/2015    Family History  Problem Relation Age of Onset   Hypertension Mother    Stroke Mother    Heart disease Father    Other Neg Hx        gynecomastia   Colon cancer Neg Hx    Colon polyps Neg Hx     Social History   Socioeconomic History   Marital status: Single    Spouse name: Not on file   Number of children: Not on file   Years of education: Not on file   Highest education level: Not on file  Occupational History   Not on file  Tobacco Use   Smoking status: Former    Packs/day: 0.50    Years: 35.00    Total pack years: 17.50    Types: Cigarettes   Smokeless tobacco: Never   Tobacco comments:    "quit smoking in ~ 2007"  Substance and Sexual Activity   Alcohol use: No   Drug use: No   Sexual activity: Never  Other Topics Concern   Not on file  Social History Narrative   IN A LONG TERM PLATONIC RELATIONSHIP. BEEN GOOD FRIENDS FOR YEARS AND TAKING CARE OF EACH OTHER. SEES KIDS VERY RARELY. 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 HEARING AID DID NOT WORK.   Social Determinants of Health   Financial Resource Strain: Not on file  Food Insecurity: Not on file  Transportation Needs: Not on file  Physical Activity: Not on file  Stress: Not on file  Social Connections: Not on file  Intimate Partner Violence: Not on file     Review of Systems   Gen: Denies any fever, chills, fatigue, weight loss, lack of appetite.  CV: Denies chest pain, heart palpitations, peripheral edema, syncope.  Resp: Denies shortness of breath at rest or with exertion. Denies wheezing or cough.  GI: Denies dysphagia or odynophagia. Denies jaundice, hematemesis, fecal incontinence. GU : Denies urinary burning, urinary frequency, urinary hesitancy MS: Denies joint pain, muscle weakness, cramps, or limitation of movement.  Derm: Denies rash, itching, dry skin Psych: Denies depression, anxiety, memory loss, and confusion Heme: Denies bruising, bleeding,  and enlarged lymph nodes.   Physical Exam   BP 130/65   Pulse 86   Temp 98.2 F (36.8 C)   Ht 5' 5" (1.651 m)   Wt 187 lb 9.6 oz (85.1 kg)   BMI 31.22 kg/m  General:   Alert and oriented. Pleasant and cooperative. Well-nourished and well-developed.  Head:  Normocephalic and atraumatic. Eyes:  Without icterus Ears:  very hard of hearing Lungs:  Clear to auscultation bilaterally.  Heart:  S1, S2 present without murmurs appreciated.  Abdomen:  +BS, soft, non-tender and non-distended. No HSM noted. No guarding or rebound. No   masses appreciated.  Rectal:  Deferred  Msk:  Symmetrical without gross deformities. Normal posture. Extremities:  Without edema. Neurologic:  Alert and  oriented x4;  grossly normal neurologically. Skin:  Intact without significant lesions or rashes. Psych:  Alert and cooperative. Normal mood and affect.   Assessment   Derek Mckenzie is a 66 y.o. male presenting today to arrange 3 year surveillance colonoscopy due to history of multiple polyps in 2021.   He has no concerning lower or upper GI signs/symptoms. As of note, he is very hard of hearing.   PLAN   Proceed with colonoscopy by Dr. Carver  in near future: the risks, benefits, and alternatives have been discussed with the patient in detail. The patient states understanding and desires to proceed. ASA 3.   Hold Jardiance X 72 hours prior  No insulin the morning of the procedure  Namiko Pritts W. Lauraann Missey, PhD, ANP-BC [facility]    

## 2023-02-05 NOTE — H&P (View-Only) (Signed)
Gastroenterology Office Note    Referring Provider: Glenda Chroman, MD Primary Care Physician:  Glenda Chroman, MD  Primary GI: Dr. Abbey Chatters    Chief Complaint   Chief Complaint  Patient presents with   Follow-up    Due for surveillance colonoscopy      History of Present Illness   Derek Mckenzie is a 67 y.o. male presenting today to arrange surveillance colonoscopy. He has a history of multiple polyps in 2021 and due for surveillance now.   No abdominal pain, N/V, changes in bowel habits, constipation, diarrhea, overt GI bleeding, GERD, dysphagia, unexplained weight loss, lack of appetite, unexplained weight gain.      Colonoscopy  2021: Seven 3-6 mm polyps in sigmoid, transverse, ascending colon, cecum, two 8-12 mm polyps in mid transverse, (two serrated polyps, six simple adenomas, one hyperplastic polyp).    Past Medical History:  Diagnosis Date   CHF (congestive heart failure) (HCC)    Deafness in left ear    ED (erectile dysfunction)    Hearing loss in right ear    Hyperlipidemia    Hypertension    PAD (peripheral artery disease) ([place])    Type II diabetes mellitus ([place])     Past Surgical History:  Procedure Laterality Date   CATARACT EXTRACTION W/PHACO Left 03/11/2016   Procedure: CATARACT EXTRACTION PHACO AND INTRAOCULAR LENS PLACEMENT ([place]);  Surgeon: Rutherford Guys, MD;  Location: [facility];  Service: Ophthalmology;  Laterality: Left;  CDE:2.71   CATARACT EXTRACTION W/PHACO Right 05/20/2016   Procedure: CATARACT EXTRACTION PHACO AND INTRAOCULAR LENS PLACEMENT ([facility]);  Surgeon: Rutherford Guys, MD;  Location: [facility];  Service: Ophthalmology;  Laterality: Right;  CDE: 4.33   COLONOSCOPY N/A 01/20/2020   Procedure: COLONOSCOPY;  Surgeon: Danie Binder, MD;  Location: [facility];  Service: Endoscopy;  Laterality: N/A;  8:30am   POLYPECTOMY  01/20/2020   Procedure: POLYPECTOMY;  Surgeon: Danie Binder, MD;  Location: [facility];  Service: Endoscopy;;    RETINAL LASER PROCEDURE     VASECTOMY     YAG LASER APPLICATION Right [id number]   Procedure: YAG LASER APPLICATION;  Surgeon: Rutherford Guys, MD;  Location: [facility];  Service: Ophthalmology;  Laterality: Right;    Current Outpatient Medications  Medication Sig Dispense Refill   Alpha-Lipoic Acid 200 MG CAPS Take 200 mg by mouth daily.      amLODipine (NORVASC) 10 MG tablet TAKE 1 TABLET BY MOUTH ONCE DAILY 30 tablet 3   atorvastatin (LIPITOR) 80 MG tablet TAKE 1 TABLET BY MOUTH ONCE DAILY. 30 tablet 11   carvedilol (COREG) 25 MG tablet TAKE 1 TABLET 2 TIMES A DAY WITH A MEAL. 60 tablet 0   ezetimibe (ZETIA) 10 MG tablet TAKE 1 TABLET BY MOUTH ONCE DAILY. 30 tablet 0   fenofibrate (TRICOR) 145 MG tablet Take 1 tablet (145 mg total) by mouth daily. 90 tablet 3   folic acid (FOLVITE) A999333 MCG tablet Take 400 mcg by mouth daily.      hydrALAZINE (APRESOLINE) 100 MG tablet TAKE 1 TABLET BY MOUTH 3 TIMES DAILY. 90 tablet 0   insulin NPH-regular Human (NOVOLIN 70/30 RELION) (70-30) 100 UNIT/ML injection Inject 14 Units into the skin 2 (two) times daily with a meal. 30 mL 3   isosorbide mononitrate (IMDUR) 60 MG 24 hr tablet TAKE 1 TABLET IN THE MORNING AND 1 TABLET AT BEDTIME. 180 tablet 3   JARDIANCE 10 MG TABS tablet TAKE 1 TABLET  DAILY BEFORE BREAKFAST. 30 tablet 0   latanoprost (XALATAN) 0.005 % ophthalmic solution Place 1 drop into both eyes at bedtime.     sacubitril-valsartan (ENTRESTO) 49-51 MG Take 1 tablet by mouth 2 (two) times daily. Must keep appointments for further refills 60 tablet 3   spironolactone (ALDACTONE) 25 MG tablet TAKE (1/2) TABLET BY MOUTH DAILY. 15 tablet 0   vitamin B-12 (CYANOCOBALAMIN) 1000 MCG tablet Take 1,000 mcg by mouth daily.      No current facility-administered medications for this visit.    Allergies as of 02/05/2023 - Review Complete 02/05/2023  Allergen Reaction Noted   Atenolol Shortness Of Breath 10/31/2015   Amlodipine besy-benazepril hcl Other (See  Comments) 10/31/2015    Family History  Problem Relation Age of Onset   Hypertension Mother    Stroke Mother    Heart disease Father    Other Neg Hx        gynecomastia   Colon cancer Neg Hx    Colon polyps Neg Hx     Social History   Socioeconomic History   Marital status: Single    Spouse name: Not on file   Number of children: Not on file   Years of education: Not on file   Highest education level: Not on file  Occupational History   Not on file  Tobacco Use   Smoking status: Former    Packs/day: 0.50    Years: 35.00    Total pack years: 17.50    Types: Cigarettes   Smokeless tobacco: Never   Tobacco comments:    "quit smoking in ~ 2007"  Substance and Sexual Activity   Alcohol use: No   Drug use: No   Sexual activity: Never  Other Topics Concern   Not on file  Social History Narrative   IN A LONG TERM PLATONIC RELATIONSHIP. BEEN GOOD FRIENDS FOR YEARS AND TAKING CARE OF EACH OTHER. SEES KIDS VERY RARELY. 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 HEARING AID DID NOT WORK.   Social Determinants of Health   Financial Resource Strain: Not on file  Food Insecurity: Not on file  Transportation Needs: Not on file  Physical Activity: Not on file  Stress: Not on file  Social Connections: Not on file  Intimate Partner Violence: Not on file     Review of Systems   Gen: Denies any fever, chills, fatigue, weight loss, lack of appetite.  CV: Denies chest pain, heart palpitations, peripheral edema, syncope.  Resp: Denies shortness of breath at rest or with exertion. Denies wheezing or cough.  GI: Denies dysphagia or odynophagia. Denies jaundice, hematemesis, fecal incontinence. GU : Denies urinary burning, urinary frequency, urinary hesitancy MS: Denies joint pain, muscle weakness, cramps, or limitation of movement.  Derm: Denies rash, itching, dry skin Psych: Denies depression, anxiety, memory loss, and confusion Heme: Denies bruising, bleeding,  and enlarged lymph nodes.   Physical Exam   BP 130/65   Pulse 86   Temp 98.2 F (36.8 C)   Ht 5' 5"$  (1.651 m)   Wt 187 lb 9.6 oz (85.1 kg)   BMI 31.22 kg/m  General:   Alert and oriented. Pleasant and cooperative. Well-nourished and well-developed.  Head:  Normocephalic and atraumatic. Eyes:  Without icterus Ears:  very hard of hearing Lungs:  Clear to auscultation bilaterally.  Heart:  S1, S2 present without murmurs appreciated.  Abdomen:  +BS, soft, non-tender and non-distended. No HSM noted. No guarding or rebound. No  masses appreciated.  Rectal:  Deferred  Msk:  Symmetrical without gross deformities. Normal posture. Extremities:  Without edema. Neurologic:  Alert and  oriented x4;  grossly normal neurologically. Skin:  Intact without significant lesions or rashes. Psych:  Alert and cooperative. Normal mood and affect.   Assessment   Derek Mckenzie is a 67 y.o. male presenting today to arrange 3 year surveillance colonoscopy due to history of multiple polyps in 2021.   He has no concerning lower or upper GI signs/symptoms. As of note, he is very hard of hearing.   PLAN   Proceed with colonoscopy by Dr. Abbey Chatters  in near future: the risks, benefits, and alternatives have been discussed with the patient in detail. The patient states understanding and desires to proceed. ASA 3.   Hold Jardiance X 72 hours prior  No insulin the morning of the procedure  Annitta Needs, PhD, ANP-BC [facility]

## 2023-02-06 ENCOUNTER — Telehealth: Payer: Self-pay | Admitting: *Deleted

## 2023-02-06 ENCOUNTER — Encounter: Payer: Self-pay | Admitting: *Deleted

## 2023-02-06 MED ORDER — PEG 3350-KCL-NA BICARB-NACL 420 G PO SOLR: 4000.0000 mL | Freq: Once | ORAL | 0 refills | Status: AC

## 2023-02-06 NOTE — Telephone Encounter (Signed)
Spoke with pt. Scheduled for TCS with Dr. Abbey Chatters ASA 3 on 2/19. Aware will send instructions/pre-op to mychart. Rx for prep sent to pharmacy

## 2023-02-10 NOTE — Patient Instructions (Signed)
   Your procedure is scheduled on: 02/16/2023  Report to [facility] Entrance at   845  AM.  Call this number if you have problems the morning of surgery: [phone]   Remember:              Follow Directions on the letter you received from Your Physician's office regarding the Bowel Prep              No Smoking the day of Procedure :   Take these medicines the morning of surgery with A SIP OF WATER: Carvedilol, amlodipine, hydralazine, and isosorbide  Hold Jardiance for 3 days  No diabetic medication am of procedure   Do not wear jewelry, make-up or nail polish.    Do not bring valuables to the hospital.  Contacts, dentures or bridgework may not be worn into surgery.  .   Patients discharged the day of surgery will not be allowed to drive home.     Colonoscopy, Adult, Care After This sheet gives you information about how to care for yourself after your procedure. Your health care provider may also give you more specific instructions. If you have problems or questions, contact your health care provider. What can I expect after the procedure? After the procedure, it is common to have: A small amount of blood in your stool for 24 hours after the procedure. Some gas. Mild abdominal cramping or bloating.  Follow these instructions at home: General instructions  For the first 24 hours after the procedure: Do not drive or use machinery. Do not sign important documents. Do not drink alcohol. Do your regular daily activities at a slower pace than normal. Eat soft, easy-to-digest foods. Rest often. Take over-the-counter or prescription medicines only as told by your health care provider. It is up to you to get the results of your procedure. Ask your health care provider, or the department performing the procedure, when your results will be ready. Relieving cramping and bloating Try walking around when you have cramps or feel bloated. Apply heat to your abdomen as told by  your health care provider. Use a heat source that your health care provider recommends, such as a moist heat pack or a heating pad. Place a towel between your skin and the heat source. Leave the heat on for 20-30 minutes. Remove the heat if your skin turns bright red. This is especially important if you are unable to feel pain, heat, or cold. You may have a greater risk of getting burned. Eating and drinking Drink enough fluid to keep your urine clear or pale yellow. Resume your normal diet as instructed by your health care provider. Avoid heavy or fried foods that are hard to digest. Avoid drinking alcohol for as long as instructed by your health care provider. Contact a health care provider if: You have blood in your stool 2-3 days after the procedure. Get help right away if: You have more than a small spotting of blood in your stool. You pass large blood clots in your stool. Your abdomen is swollen. You have nausea or vomiting. You have a fever. You have increasing abdominal pain that is not relieved with medicine. This information is not intended to replace advice given to you by your health care provider. Make sure you discuss any questions you have with your health care provider. Document Released: 07/29/2004 Document Revised: 09/08/2016 Document Reviewed: 02/26/2016 Elsevier Interactive Patient Education  [employer].

## 2023-02-11 ENCOUNTER — Encounter (HOSPITAL_COMMUNITY): Payer: Self-pay

## 2023-02-11 ENCOUNTER — Encounter (HOSPITAL_COMMUNITY)
Admission: RE | Admit: 2023-02-11 | Discharge: 2023-02-11 | Disposition: A | Discharge status: Home / Self Care | Payer: PPO | Source: Ambulatory Visit | Attending: Internal Medicine | Admitting: Internal Medicine

## 2023-02-11 VITALS — BP 126/50 | HR 83 | Temp 98.2°F | Resp 18 | Ht 65.0 in | Wt 187.6 lb

## 2023-02-11 DIAGNOSIS — Z01812 Encounter for preprocedural laboratory examination: Secondary | ICD-10-CM | POA: Insufficient documentation

## 2023-02-11 DIAGNOSIS — Z794 Long term (current) use of insulin: Secondary | ICD-10-CM | POA: Diagnosis not present

## 2023-02-11 DIAGNOSIS — R195 Other fecal abnormalities: Secondary | ICD-10-CM | POA: Insufficient documentation

## 2023-02-11 DIAGNOSIS — E1142 Type 2 diabetes mellitus with diabetic polyneuropathy: Secondary | ICD-10-CM | POA: Diagnosis not present

## 2023-02-11 LAB — BASIC METABOLIC PANEL
Anion gap: 9 (ref 5–15)
BUN: 41 mg/dL — ABNORMAL HIGH (ref 8–23)
CO2: 20 mmol/L — ABNORMAL LOW (ref 22–32)
Calcium: 9 mg/dL (ref 8.9–10.3)
Chloride: 110 mmol/L (ref 98–111)
Creatinine, Ser: 2.53 mg/dL — ABNORMAL HIGH (ref 0.61–1.24)
GFR, Estimated: 27 mL/min — ABNORMAL LOW (ref >60)
Glucose, Bld: 66 mg/dL — ABNORMAL LOW (ref 70–99)
Potassium: 4.6 mmol/L (ref 3.5–5.1)
Sodium: 139 mmol/L (ref 135–145)

## 2023-02-11 LAB — CBC WITH DIFFERENTIAL/PLATELET
Abs Immature Granulocytes: 0.03 10*3/uL (ref 0.00–0.07)
Basophils Absolute: 0 10*3/uL (ref 0.0–0.1)
Basophils Relative: 0 %
Eosinophils Absolute: 0.1 10*3/uL (ref 0.0–0.5)
Eosinophils Relative: 1 %
HCT: 35.3 % — ABNORMAL LOW (ref 39.0–52.0)
Hemoglobin: 11.4 g/dL — ABNORMAL LOW (ref 13.0–17.0)
Immature Granulocytes: 0 %
Lymphocytes Relative: 18 %
Lymphs Abs: 1.5 10*3/uL (ref 0.7–4.0)
MCH: 29.8 pg (ref 26.0–34.0)
MCHC: 32.3 g/dL (ref 30.0–36.0)
MCV: 92.2 fL (ref 80.0–100.0)
Monocytes Absolute: 0.8 10*3/uL (ref 0.1–1.0)
Monocytes Relative: 10 %
Neutro Abs: 5.9 10*3/uL (ref 1.7–7.7)
Neutrophils Relative %: 71 %
Platelets: 284 10*3/uL (ref 150–400)
RBC: 3.83 MIL/uL — ABNORMAL LOW (ref 4.22–5.81)
RDW: 15.4 % (ref 11.5–15.5)
WBC: 8.4 10*3/uL (ref 4.0–10.5)
nRBC: 0 % (ref 0.0–0.2)

## 2023-02-16 ENCOUNTER — Ambulatory Visit (HOSPITAL_BASED_OUTPATIENT_CLINIC_OR_DEPARTMENT_OTHER): Payer: PPO | Admitting: Anesthesiology

## 2023-02-16 ENCOUNTER — Encounter (HOSPITAL_COMMUNITY): Payer: Self-pay

## 2023-02-16 ENCOUNTER — Ambulatory Visit (HOSPITAL_COMMUNITY): Payer: PPO | Admitting: Anesthesiology

## 2023-02-16 ENCOUNTER — Ambulatory Visit (HOSPITAL_COMMUNITY)
Admission: RE | Admit: 2023-02-16 | Discharge: 2023-02-16 | Disposition: A | Discharge status: Home / Self Care | Payer: PPO | Attending: Internal Medicine | Admitting: Internal Medicine

## 2023-02-16 ENCOUNTER — Encounter (HOSPITAL_COMMUNITY)
Admission: RE | Disposition: A | Discharge status: Home / Self Care | Payer: Self-pay | Source: Home / Self Care | Attending: Internal Medicine

## 2023-02-16 ENCOUNTER — Other Ambulatory Visit: Payer: Self-pay

## 2023-02-16 DIAGNOSIS — E1151 Type 2 diabetes mellitus with diabetic peripheral angiopathy without gangrene: Secondary | ICD-10-CM | POA: Insufficient documentation

## 2023-02-16 DIAGNOSIS — Z7985 Long-term (current) use of injectable non-insulin antidiabetic drugs: Secondary | ICD-10-CM | POA: Insufficient documentation

## 2023-02-16 DIAGNOSIS — Q438 Other specified congenital malformations of intestine: Secondary | ICD-10-CM | POA: Diagnosis not present

## 2023-02-16 DIAGNOSIS — D124 Benign neoplasm of descending colon: Secondary | ICD-10-CM

## 2023-02-16 DIAGNOSIS — Z1211 Encounter for screening for malignant neoplasm of colon: Secondary | ICD-10-CM | POA: Diagnosis not present

## 2023-02-16 DIAGNOSIS — D120 Benign neoplasm of cecum: Secondary | ICD-10-CM | POA: Insufficient documentation

## 2023-02-16 DIAGNOSIS — K648 Other hemorrhoids: Secondary | ICD-10-CM | POA: Insufficient documentation

## 2023-02-16 DIAGNOSIS — Z86010 Personal history of colonic polyps: Secondary | ICD-10-CM

## 2023-02-16 DIAGNOSIS — Z87891 Personal history of nicotine dependence: Secondary | ICD-10-CM | POA: Insufficient documentation

## 2023-02-16 DIAGNOSIS — D175 Benign lipomatous neoplasm of intra-abdominal organs: Secondary | ICD-10-CM | POA: Insufficient documentation

## 2023-02-16 DIAGNOSIS — I509 Heart failure, unspecified: Secondary | ICD-10-CM | POA: Insufficient documentation

## 2023-02-16 DIAGNOSIS — D122 Benign neoplasm of ascending colon: Secondary | ICD-10-CM | POA: Insufficient documentation

## 2023-02-16 DIAGNOSIS — I110 Hypertensive heart disease with heart failure: Secondary | ICD-10-CM | POA: Insufficient documentation

## 2023-02-16 DIAGNOSIS — H9192 Unspecified hearing loss, left ear: Secondary | ICD-10-CM | POA: Insufficient documentation

## 2023-02-16 DIAGNOSIS — Z794 Long term (current) use of insulin: Secondary | ICD-10-CM | POA: Insufficient documentation

## 2023-02-16 DIAGNOSIS — K635 Polyp of colon: Secondary | ICD-10-CM | POA: Insufficient documentation

## 2023-02-16 DIAGNOSIS — Z09 Encounter for follow-up examination after completed treatment for conditions other than malignant neoplasm: Secondary | ICD-10-CM | POA: Diagnosis present

## 2023-02-16 HISTORY — PX: POLYPECTOMY: SHX5525

## 2023-02-16 HISTORY — PX: COLONOSCOPY WITH PROPOFOL: SHX5780

## 2023-02-16 LAB — GLUCOSE, CAPILLARY
Glucose-Capillary: 74 mg/dL (ref 70–99)
Glucose-Capillary: 127 mg/dL — ABNORMAL HIGH (ref 70–99)
Glucose-Capillary: 162 mg/dL — ABNORMAL HIGH (ref 70–99)

## 2023-02-16 SURGERY — COLONOSCOPY WITH PROPOFOL
Anesthesia: General

## 2023-02-16 MED ORDER — DEXTROSE 50 % IV SOLN
INTRAVENOUS | Status: AC
  Filled 2023-02-16: qty 50

## 2023-02-16 MED ORDER — LACTATED RINGERS IV SOLN: INTRAVENOUS | Status: DC

## 2023-02-16 MED ORDER — PROPOFOL 500 MG/50ML IV EMUL
INTRAVENOUS | Status: AC
  Filled 2023-02-16: qty 50

## 2023-02-16 MED ORDER — PROPOFOL 10 MG/ML IV BOLUS
INTRAVENOUS | Status: DC | PRN
  Administered 2023-02-16: 60 mg via INTRAVENOUS

## 2023-02-16 MED ORDER — DEXTROSE 50 % IV SOLN
25.0000 mL | Freq: Once | INTRAVENOUS | Status: AC
  Administered 2023-02-16: 25 mL via INTRAVENOUS

## 2023-02-16 MED ORDER — PROPOFOL 500 MG/50ML IV EMUL
INTRAVENOUS | Status: DC | PRN
  Administered 2023-02-16: 150 ug/kg/min via INTRAVENOUS

## 2023-02-16 NOTE — Transfer of Care (Signed)
Immediate Anesthesia Transfer of Care Note  Patient: Derek Mckenzie  Procedure(s) Performed: COLONOSCOPY WITH PROPOFOL POLYPECTOMY  Patient Location: PACU  Anesthesia Type:General  Level of Consciousness: awake, alert , and oriented  Airway & Oxygen Therapy: Patient Spontanous Breathing  Post-op Assessment: Report given to RN, Post -op Vital signs reviewed and stable, Patient moving all extremities X 4, and Patient able to stick tongue midline  Post vital signs: Reviewed  Last Vitals:  Vitals Value Taken Time  BP 162/77 02/16/23 1046  Temp 37 C 02/16/23 1046  Pulse 83 02/16/23 1046  Resp 15 02/16/23 1046  SpO2 99 % 02/16/23 1046    Last Pain:  Vitals:   02/16/23 1046  TempSrc: Oral  PainSc: 0-No pain      Patients Stated Pain Goal: 9 ([id number])  Complications: No notable events documented.

## 2023-02-16 NOTE — Anesthesia Postprocedure Evaluation (Signed)
Anesthesia Post Note  Patient: Parminder Forton  Procedure(s) Performed: COLONOSCOPY WITH PROPOFOL POLYPECTOMY  Patient location during evaluation: Phase II Anesthesia Type: General Level of consciousness: awake Pain management: pain level controlled Vital Signs Assessment: post-procedure vital signs reviewed and stable Respiratory status: spontaneous breathing and respiratory function stable Cardiovascular status: blood pressure returned to baseline and stable Postop Assessment: no headache and no apparent nausea or vomiting Anesthetic complications: no Comments: Late entry   No notable events documented.   Last Vitals:  Vitals:   02/16/23 0908 02/16/23 1046  BP: (!) 156/65 (!) 162/77  Pulse: 84 83  Resp: 14 15  Temp:  37 C  SpO2: 100% 99%    Last Pain:  Vitals:   02/16/23 1046  TempSrc: Oral  PainSc: 0-No pain                 Louann Sjogren

## 2023-02-16 NOTE — Anesthesia Preprocedure Evaluation (Signed)
Anesthesia Evaluation  Patient identified by MRN, date of birth, ID band Patient awake    Reviewed: Allergy & Precautions, H&P , NPO status , Patient's Chart, lab work & pertinent test results, reviewed documented beta blocker date and time   Airway Mallampati: II  TM Distance: >3 FB Neck ROM: full    Dental no notable dental hx.    Pulmonary neg pulmonary ROS, former smoker   Pulmonary exam normal breath sounds clear to auscultation       Cardiovascular Exercise Tolerance: Good hypertension, + Peripheral Vascular Disease, +CHF and + Orthopnea  negative cardio ROS  Rhythm:regular Rate:Normal     Neuro/Psych  Neuromuscular disease negative neurological ROS  negative psych ROS   GI/Hepatic negative GI ROS, Neg liver ROS,,,  Endo/Other  negative endocrine ROSdiabetes, Type 2    Renal/GU negative Renal ROS  negative genitourinary   Musculoskeletal   Abdominal   Peds  Hematology negative hematology ROS (+)   Anesthesia Other Findings   Reproductive/Obstetrics negative OB ROS                             Anesthesia Physical Anesthesia Plan  ASA: 3  Anesthesia Plan: General   Post-op Pain Management:    Induction:   PONV Risk Score and Plan: Propofol infusion  Airway Management Planned:   Additional Equipment:   Intra-op Plan:   Post-operative Plan:   Informed Consent: I have reviewed the patients History and Physical, chart, labs and discussed the procedure including the risks, benefits and alternatives for the proposed anesthesia with the patient or authorized representative who has indicated his/her understanding and acceptance.     Dental Advisory Given  Plan Discussed with: CRNA  Anesthesia Plan Comments:        Anesthesia Quick Evaluation

## 2023-02-16 NOTE — Interval H&P Note (Signed)
History and Physical Interval Note:  02/16/2023 10:13 AM  Derek Mckenzie  has presented today for surgery, with the diagnosis of HX POLYPS.  The various methods of treatment have been discussed with the patient and family. After consideration of risks, benefits and other options for treatment, the patient has consented to  Procedure(s) with comments: COLONOSCOPY WITH PROPOFOL (N/A) - 1030am, asa 3 as a surgical intervention.  The patient's history has been reviewed, patient examined, no change in status, stable for surgery.  I have reviewed the patient's chart and labs.  Questions were answered to the patient's satisfaction.     [name]

## 2023-02-16 NOTE — Anesthesia Procedure Notes (Signed)
Procedure Name: General with mask airway Date/Time: 02/16/2023 10:20 AM  Performed by: Maude Leriche, CRNAPre-anesthesia Checklist: Patient identified, Emergency Drugs available, Suction available, Patient being monitored and Timeout performed Patient Re-evaluated:Patient Re-evaluated prior to induction Oxygen Delivery Method: Nasal cannula Induction Type: IV induction Placement Confirmation: positive ETCO2 Dental Injury: Teeth and Oropharynx as per pre-operative assessment

## 2023-02-16 NOTE — Op Note (Signed)
[facility] Patient Name: Derek Mckenzie Procedure Date: 02/16/2023 9:42 AM MRN: [id number] Date of Birth: [birth year] Attending MD: Elon Alas. Abbey Chatters , [place], [id number] CSN: [id number] Age: 67 Admit Type: Outpatient Procedure:                Colonoscopy Indications:              Surveillance: Personal history of adenomatous                            polyps on last colonoscopy 3 years ago Providers:                Elon Alas. Abbey Chatters, DO, Lambert Mody, Aram Candela Referring MD:              Medicines:                See the Anesthesia note for documentation of the                            administered medications Complications:            No immediate complications. Estimated Blood Loss:     Estimated blood loss was minimal. Procedure:                Pre-Anesthesia Assessment:                           - The anesthesia plan was to use monitored                            anesthesia care (MAC).                           After obtaining informed consent, the colonoscope                            was passed under direct vision. Throughout the                            procedure, the patient's blood pressure, pulse, and                            oxygen saturations were monitored continuously. The                            [id number] scope was introduced through                            the anus and advanced to the the cecum, identified                            by appendiceal orifice and ileocecal valve. The                            patient tolerated the procedure well. The  quality                            of the bowel preparation was evaluated using the                            BBPS [facility] Bowel Preparation Scale) with scores                            of: Right Colon = 3, Transverse Colon = 3 and Left                            Colon = 3 (entire mucosa seen well with no residual                            staining, small  fragments of stool or opaque                            liquid). The total BBPS score equals 9. The                            colonoscopy was technically difficult and complex                            due to a redundant colon and significant looping.                            Successful completion of the procedure was aided by                            applying abdominal pressure. Scope In: 10:23:19 AM Scope Out: 10:41:18 AM Scope Withdrawal Time: 0 hours 14 minutes 52 seconds  Total Procedure Duration: 0 hours 17 minutes 59 seconds  Findings:      The perianal and digital rectal examinations were normal.      Non-bleeding internal hemorrhoids were found during endoscopy.      Three sessile polyps were found in the ascending colon and cecum. The       polyps were 3 to 5 mm in size. These polyps were removed with a cold       snare. Resection and retrieval were complete.      A 4 mm polyp was found in the descending colon. The polyp was sessile.       The polyp was removed with a cold snare. Resection and retrieval were       complete.      There was a medium-sized lipoma, in the ascending colon. Impression:               - Non-bleeding internal hemorrhoids.                           - Three 3 to 5 mm polyps in the ascending colon and                            in the cecum, removed with a  cold snare. Resected                            and retrieved.                           - One 4 mm polyp in the descending colon, removed                            with a cold snare. Resected and retrieved.                           - The examination was otherwise normal. Moderate Sedation:      Per Anesthesia Care Recommendation:           - Patient has a contact number available for                            emergencies. The signs and symptoms of potential                            delayed complications were discussed with the                            patient. Return to normal activities  tomorrow.                            Written discharge instructions were provided to the                            patient.                           - Resume previous diet.                           - Continue present medications.                           - Await pathology results.                           - Repeat colonoscopy in 5 years for surveillance.                           - Return to GI clinic PRN. Procedure Code(s):        --- Professional ---                           [phone], Colonoscopy, flexible; with removal of                            tumor(s), polyp(s), or other lesion(s) by snare                            technique Diagnosis Code(s):        --- Professional ---  Z86.010, Personal history of colonic polyps                           D12.2, Benign neoplasm of ascending colon                           D12.0, Benign neoplasm of cecum                           D12.4, Benign neoplasm of descending colon                           K64.8, Other hemorrhoids CPT copyright 2022 American Medical Association. All rights reserved. The codes documented in this report are preliminary and upon coder review may  be revised to meet current compliance requirements. Elon Alas. Abbey Chatters, DO [place] Abbey Chatters, DO 02/16/2023 10:44:38 AM This report has been signed electronically. Number of Addenda: 0

## 2023-02-16 NOTE — Discharge Instructions (Addendum)
  Colonoscopy Discharge Instructions  Read the instructions outlined below and refer to this sheet in the next few weeks. These discharge instructions provide you with general information on caring for yourself after you leave the hospital. Your doctor may also give you specific instructions. While your treatment has been planned according to the most current medical practices available, unavoidable complications occasionally occur.   ACTIVITY You may resume your regular activity, but move at a slower pace for the next 24 hours.  Take frequent rest periods for the next 24 hours.  Walking will help get rid of the air and reduce the bloated feeling in your belly (abdomen).  No driving for 24 hours (because of the medicine (anesthesia) used during the test).   Do not sign any important legal documents or operate any machinery for 24 hours (because of the anesthesia used during the test).  NUTRITION Drink plenty of fluids.  You may resume your normal diet as instructed by your doctor.  Begin with a light meal and progress to your normal diet. Heavy or fried foods are harder to digest and may make you feel sick to your stomach (nauseated).  Avoid alcoholic beverages for 24 hours or as instructed.  MEDICATIONS You may resume your normal medications unless your doctor tells you otherwise.  WHAT YOU CAN EXPECT TODAY Some feelings of bloating in the abdomen.  Passage of more gas than usual.  Spotting of blood in your stool or on the toilet paper.  IF YOU HAD POLYPS REMOVED DURING THE COLONOSCOPY: No aspirin products for 7 days or as instructed.  No alcohol for 7 days or as instructed.  Eat a soft diet for the next 24 hours.  FINDING OUT THE RESULTS OF YOUR TEST Not all test results are available during your visit. If your test results are not back during the visit, make an appointment with your caregiver to find out the results. Do not assume everything is normal if you have not heard from your  caregiver or the medical facility. It is important for you to follow up on all of your test results.  SEEK IMMEDIATE MEDICAL ATTENTION IF: You have more than a spotting of blood in your stool.  Your belly is swollen (abdominal distention).  You are nauseated or vomiting.  You have a temperature over 101.  You have abdominal pain or discomfort that is severe or gets worse throughout the day.   Your colonoscopy revealed 4 polyp(s) which I removed successfully. Await pathology results, my office will contact you. I recommend repeating colonoscopy in 5 years for surveillance purposes. Otherwise follow up with GI as needed.    I hope you have a great rest of your week!  Elon Alas. Abbey Chatters, D.O. Gastroenterology and Hepatology [facility]

## 2023-02-17 LAB — SURGICAL PATHOLOGY

## 2023-02-20 ENCOUNTER — Encounter (HOSPITAL_COMMUNITY): Payer: Self-pay | Admitting: Internal Medicine

## 2023-02-20 ENCOUNTER — Other Ambulatory Visit (HOSPITAL_COMMUNITY): Payer: Self-pay | Admitting: Cardiology

## 2023-03-04 DIAGNOSIS — R52 Pain, unspecified: Secondary | ICD-10-CM | POA: Diagnosis not present

## 2023-03-04 DIAGNOSIS — Z299 Encounter for prophylactic measures, unspecified: Secondary | ICD-10-CM | POA: Diagnosis not present

## 2023-03-04 DIAGNOSIS — E1165 Type 2 diabetes mellitus with hyperglycemia: Secondary | ICD-10-CM | POA: Diagnosis not present

## 2023-03-04 DIAGNOSIS — I739 Peripheral vascular disease, unspecified: Secondary | ICD-10-CM | POA: Diagnosis not present

## 2023-03-04 DIAGNOSIS — I10 Essential (primary) hypertension: Secondary | ICD-10-CM | POA: Diagnosis not present

## 2023-03-04 DIAGNOSIS — M25512 Pain in left shoulder: Secondary | ICD-10-CM | POA: Diagnosis not present

## 2023-03-05 ENCOUNTER — Other Ambulatory Visit (HOSPITAL_COMMUNITY): Payer: Self-pay | Admitting: Cardiology

## 2023-03-11 DIAGNOSIS — L2089 Other atopic dermatitis: Secondary | ICD-10-CM | POA: Diagnosis not present

## 2023-03-11 DIAGNOSIS — L281 Prurigo nodularis: Secondary | ICD-10-CM | POA: Diagnosis not present

## 2023-03-17 DIAGNOSIS — M79676 Pain in unspecified toe(s): Secondary | ICD-10-CM | POA: Diagnosis not present

## 2023-03-17 DIAGNOSIS — B351 Tinea unguium: Secondary | ICD-10-CM | POA: Diagnosis not present

## 2023-03-17 DIAGNOSIS — E1142 Type 2 diabetes mellitus with diabetic polyneuropathy: Secondary | ICD-10-CM | POA: Diagnosis not present

## 2023-03-17 DIAGNOSIS — L84 Corns and callosities: Secondary | ICD-10-CM | POA: Diagnosis not present

## 2023-03-20 ENCOUNTER — Other Ambulatory Visit: Payer: Self-pay

## 2023-03-20 NOTE — Telephone Encounter (Signed)
This is a CHF pt 

## 2023-03-23 MED ORDER — CARVEDILOL 25 MG PO TABS: ORAL_TABLET | ORAL | 0 refills | Status: DC

## 2023-04-02 DIAGNOSIS — H401132 Primary open-angle glaucoma, bilateral, moderate stage: Secondary | ICD-10-CM | POA: Diagnosis not present

## 2023-04-03 ENCOUNTER — Other Ambulatory Visit (HOSPITAL_COMMUNITY): Payer: Self-pay | Admitting: Cardiology

## 2023-04-24 ENCOUNTER — Other Ambulatory Visit: Payer: Self-pay | Admitting: Cardiology

## 2023-05-01 ENCOUNTER — Other Ambulatory Visit: Payer: Self-pay | Admitting: Cardiology

## 2023-05-11 DIAGNOSIS — L281 Prurigo nodularis: Secondary | ICD-10-CM | POA: Diagnosis not present

## 2023-05-11 DIAGNOSIS — L2089 Other atopic dermatitis: Secondary | ICD-10-CM | POA: Diagnosis not present

## 2023-05-15 DIAGNOSIS — Z1339 Encounter for screening examination for other mental health and behavioral disorders: Secondary | ICD-10-CM | POA: Diagnosis not present

## 2023-05-15 DIAGNOSIS — Z1331 Encounter for screening for depression: Secondary | ICD-10-CM | POA: Diagnosis not present

## 2023-05-15 DIAGNOSIS — Z299 Encounter for prophylactic measures, unspecified: Secondary | ICD-10-CM | POA: Diagnosis not present

## 2023-05-15 DIAGNOSIS — I429 Cardiomyopathy, unspecified: Secondary | ICD-10-CM | POA: Diagnosis not present

## 2023-05-15 DIAGNOSIS — Z87891 Personal history of nicotine dependence: Secondary | ICD-10-CM | POA: Diagnosis not present

## 2023-05-15 DIAGNOSIS — Z6832 Body mass index (BMI) 32.0-32.9, adult: Secondary | ICD-10-CM | POA: Diagnosis not present

## 2023-05-15 DIAGNOSIS — I739 Peripheral vascular disease, unspecified: Secondary | ICD-10-CM | POA: Diagnosis not present

## 2023-05-15 DIAGNOSIS — Z7189 Other specified counseling: Secondary | ICD-10-CM | POA: Diagnosis not present

## 2023-05-15 DIAGNOSIS — I10 Essential (primary) hypertension: Secondary | ICD-10-CM | POA: Diagnosis not present

## 2023-05-15 DIAGNOSIS — Z0000 Encounter for general adult medical examination without abnormal findings: Secondary | ICD-10-CM | POA: Diagnosis not present

## 2023-05-15 DIAGNOSIS — I5022 Chronic systolic (congestive) heart failure: Secondary | ICD-10-CM | POA: Diagnosis not present

## 2023-05-26 ENCOUNTER — Other Ambulatory Visit: Payer: Self-pay | Admitting: Cardiology

## 2023-05-28 DIAGNOSIS — S80812A Abrasion, left lower leg, initial encounter: Secondary | ICD-10-CM | POA: Diagnosis not present

## 2023-06-01 ENCOUNTER — Other Ambulatory Visit: Payer: Self-pay | Admitting: Cardiology

## 2023-06-10 NOTE — Progress Notes (Signed)
Patient ID: Derek Mckenzie, male   DOB: [birth year], 67 y.o.   MRN: [id number] PCP: Dr. Sherril Croon HF Cardiology: Dr. Shirlee Latch  67 y.o. with history of HTN, DM, PAD, CKD and systolic CHF presents for CHF clinic followup.  He has had a long history of difficult-to-control HTN.  Medications were adjusted and repeat echo in 12/17 showed improvement in EF to 60-65%.  He has known PAD, significant on 12/17 peripheral arterial dopplers.  He saw Dr. Allyson Sabal for evaluation and conservative treatment for now was recommended.  Echo in 12/18 showed EF 60-65%, moderate LVH.  Echo in 11/20 with EF 60-65%, moderate LVH, normal RV.   Echo in 5/22 showed EF 60-65% with moderate LVH and normal RV.   Follow up 11/23, NYHA I-II and volume stable. Peripheral arterial dopplers arranged showing moderate R and L PAD.  Today he returns for HF follow up. Overall feeling fine. He is not short of breath working in his yard. His spouse died 1 year ago and he is grieving. His legs feel heavy and weak. No rest pain or pedal ulcers. Denies palpitations, abnormal bleeding, CP, dizziness, edema, or PND/Orthopnea. Appetite ok. No fever or chills. Weight at home 178-188 pounds. Taking all medications. BP at home 120/70.   ECG (personally reviewed): NSR 77 bpm, nonspecific lateral TWIs  Labs (11/16): LDL 109, HDL 26, K 4.1, creatinine 7.82, BNP 197, TSH normal Labs (3/17): K 4.2, creatinine 1.73 Labs (12/17): K 5.4, creatinine 1.5, TGs 448, unable to calculate LDL, BNP 51, hgb 12.4 Labs (1/18): K 5.1, creatinine 1.62 Labs (7/18): LDL 84, HDL 21, TGs 215 Labs (8/18): K 5.1, creatinine 1.84 Labs (10/18): LDL 64, HDL 22, LFTs normal Labs (95/62): K 4.8, creatinine 1.85 Labs (1/20): K 5.1, creatinine 1.82 Labs (10/21): LDL 82, HDL 27, K 4.1, creatinine 1.30, LFTs normal Labs (2/22): LDL 53, TGs 103 Labs (3/22): K 4.3, creatinine 1.98 Labs (12/22): HDL 57, HDL 56, K 5, creatinine 2.22 Labs (11/23): LDL 62 Labs (2/24): K 4.6, creatinine  2.53  PMH:  1. Type II diabetes. 2. HTN: x years, poorly controlled. 11/16 renal artery dopplers with no significant stenosis.  3. Deafness 4. PAD: 2012 peripheral arterial dopplers with right distal SFA stenosis and left mid SFA stenosis.  - Peripheral arterial dopplers (12/17): Probable right iliac obstruction and distal left SFA obstruction.  - ABIs (12/18): 0.61 right, 0.9 left (improved).  - ABIs (1/20): 0.62 right, 0.85 left - ABIs (12/21): moderate right PAD, no changes.  - ABIs (11/23): moderate right and left PAD, referred to Dr. Allyson Sabal. 5. Sleep study negative for OSA 6. Chronic systolic CHF: Echo (11/16) with EF 30-35%, moderate LVH.  Possible hypertensive cardiomyopathy.  - Echo (12/17): EF 60-65%.  - Echo (12/18): EF 60-65%, moderate LVH.  - Echo (11/20): EF 60-65%, moderate LVH, normal RV.  - Echo (5/22): EF 60-65% with moderate LVH and normal RV 7. CKD: Stage 3.  8. Hyperlipidemia  FH: No cardiac disease that he knows of.  +HTN.   Social History   Socioeconomic History   Marital status: Single    Spouse name: Not on file   Number of children: Not on file   Years of education: Not on file   Highest education level: Not on file  Occupational History   Not on file  Tobacco Use   Smoking status: Former    Packs/day: 0.50    Years: 35.00    Additional pack years: 0.00    Total pack years: 17.50  Types: Cigarettes   Smokeless tobacco: Never   Tobacco comments:    "quit smoking in ~ 2007"  Vaping Use   Vaping Use: Never used  Substance and Sexual Activity   Alcohol use: No   Drug use: No   Sexual activity: Never  Other Topics Concern   Not on file  Social History Narrative   IN A LONG TERM PLATONIC RELATIONSHIP. BEEN GOOD FRIENDS FOR YEARS AND TAKING CARE OF EACH OTHER. SEES KIDS VERY RARELY. 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 HEARING AID DID NOT WORK.   Social Determinants of Health   Financial Resource Strain: Not on file   Food Insecurity: Not on file  Transportation Needs: Not on file  Physical Activity: Not on file  Stress: Not on file  Social Connections: Not on file  Intimate Partner Violence: Not on file   ROS: All systems reviewed and negative except as per HPI.   Current Outpatient Medications  Medication Sig Dispense Refill   Alpha-Lipoic Acid 200 MG CAPS Take 200 mg by mouth daily.      amLODipine (NORVASC) 10 MG tablet TAKE 1 TABLET BY MOUTH ONCE DAILY 30 tablet 3   atorvastatin (LIPITOR) 80 MG tablet TAKE 1 TABLET BY MOUTH ONCE DAILY. 30 tablet 0   carvedilol (COREG) 25 MG tablet take 1 tablet 2 times a day with a meal. 60 tablet 0   ezetimibe (ZETIA) 10 MG tablet Take 1 tablet (10 mg total) by mouth daily. NEEDS FOLLOW UP APPOINTMENT FOR MORE REFILLS 90 tablet 0   fenofibrate (TRICOR) 145 MG tablet TAKE 1 TABLET BY MOUTH DAILY--NEEDS FOLLOW UP APPOINTMENT FOR ANYMORE REFILLS. 30 tablet 0   folic acid (FOLVITE) 400 MCG tablet Take 400 mcg by mouth daily.      hydrALAZINE (APRESOLINE) 100 MG tablet Take 1 tablet (100 mg total) by mouth 3 (three) times daily. NEEDS FOLLOW UP APPOINTMENT FOR MORE REFILLS 90 tablet 0   insulin NPH-regular Human (NOVOLIN 70/30 RELION) (70-30) 100 UNIT/ML injection Inject 14 Units into the skin 2 (two) times daily with a meal. 30 mL 3   Insulin Syringe-Needle U-100 31G X 15/64" 0.3 ML MISC 1 Device by Does not apply route in the morning and at bedtime. 200 each 3   isosorbide mononitrate (IMDUR) 60 MG 24 hr tablet Take 1 tablet (60 mg total) by mouth daily. NEEDS FOLLOW UP APPOINTMENT FOR MORE REFILLS 90 tablet 0   JARDIANCE 10 MG TABS tablet TAKE 1 TABLET DAILY BEFORE BREAKFAST. 30 tablet 0   latanoprost (XALATAN) 0.005 % ophthalmic solution Place 1 drop into both eyes at bedtime.     sacubitril-valsartan (ENTRESTO) 49-51 MG Take 1 tablet by mouth 2 (two) times daily. Must keep appointments for further refills 60 tablet 3   spironolactone (ALDACTONE) 25 MG tablet Take  0.5 tablets (12.5 mg total) by mouth daily. NEEDS FOLLOW UP APPOINTMENT FOR MORE REFILLS 45 tablet 0   vitamin B-12 (CYANOCOBALAMIN) 1000 MCG tablet Take 1,000 mcg by mouth daily.      No current facility-administered medications for this encounter.   BP (!) 148/62   Pulse 80   Wt 83.9 kg (185 lb)   SpO2 98%   BMI 30.79 kg/m  Physical Exam General:  NAD. No resp difficulty, walked into clinic HEENT: + HOH Neck: Supple. No JVD. Carotids 2+ bilat; no bruits. No lymphadenopathy or thryomegaly appreciated. Cor: PMI nondisplaced. Regular rate & rhythm. No rubs, gallops or murmurs. Lungs:  Clear Abdomen: Soft, nontender, nondistended. No hepatosplenomegaly. No bruits or masses. Good bowel sounds. Extremities: No cyanosis, clubbing, rash, trace BLE edema w/ venous stasis changes Neuro: Alert & oriented x 3, cranial nerves grossly intact. Moves all 4 extremities w/o difficulty. Affect pleasant.  Assessment/Plan: 1. Chronic systolic => diastolic CHF: Echo 11/16 with EF 30-35%.  Given improvement in EF to 60-65% with BP control, suspect this was a hypertensive cardiomyopathy. Has moderate LVH on echo.  NYHA class I-II, but he is limited by leg weakness/fatigue that is likely a form of claudication.   - Continue Coreg 25 mg bid. - Continue hydralazine 100 mg tid + Imdur 60 mg daily. - Continue Jardiance 10 mg daily. - Continue Entresto 49/51 mg bid. - Continue spironolactone 12.5 mg daily. - Repeat echo next visit. 2. HTN: Renal arterial dopplers in 11/16 did not show evidence for renal artery stenosis. Moderate LVH on 5/22 echo.  BP elevated today, but BP controlled on current regimen at home.  3. PAD: ABIs (12/21) abnormal in 12/21 but stable. Repeat peripheral arterial dopplers (1/24) showed moderate right and left PAD. Would need high threshold for intervention with significant CKD.  He continues with leg weakness and heaviness, no rest pain or pedal ulcers. - Refer to Dr. Allyson Sabal. - Would be  helpful to keep his vascular care within our system as it has been in the past rather than fragmenting care (apparently had ABIs done at [facility] by PCP but I cannot review).  - Continue ASA 81 and statin.   4. Hyperlipidemia:  Good lipids 11/23.  5. CKD: Stage 3.  BMET today. - Continue Jardiance.   Follow up in 4 months with Dr. Shirlee Latch + echo  [facility] FNP-BC 06/12/2023

## 2023-06-11 ENCOUNTER — Ambulatory Visit (INDEPENDENT_AMBULATORY_CARE_PROVIDER_SITE_OTHER): Payer: PPO | Admitting: Internal Medicine

## 2023-06-11 ENCOUNTER — Encounter: Payer: Self-pay | Admitting: Internal Medicine

## 2023-06-11 VITALS — BP 126/80 | HR 70 | Ht 65.0 in | Wt 185.0 lb

## 2023-06-11 DIAGNOSIS — E118 Type 2 diabetes mellitus with unspecified complications: Secondary | ICD-10-CM | POA: Diagnosis not present

## 2023-06-11 DIAGNOSIS — E1142 Type 2 diabetes mellitus with diabetic polyneuropathy: Secondary | ICD-10-CM | POA: Diagnosis not present

## 2023-06-11 DIAGNOSIS — Z794 Long term (current) use of insulin: Secondary | ICD-10-CM | POA: Diagnosis not present

## 2023-06-11 DIAGNOSIS — E1122 Type 2 diabetes mellitus with diabetic chronic kidney disease: Secondary | ICD-10-CM | POA: Diagnosis not present

## 2023-06-11 DIAGNOSIS — N1832 Chronic kidney disease, stage 3b: Secondary | ICD-10-CM | POA: Diagnosis not present

## 2023-06-11 LAB — POCT GLYCOSYLATED HEMOGLOBIN (HGB A1C): Hemoglobin A1C: 7.2 % — AB (ref 4.0–5.6)

## 2023-06-11 MED ORDER — "INSULIN SYRINGE-NEEDLE U-100 31G X 15/64"" 0.3 ML MISC": 1.0000 | Freq: Two times a day (BID) | 3 refills | Status: DC

## 2023-06-11 MED ORDER — NOVOLIN 70/30 RELION (70-30) 100 UNIT/ML ~~LOC~~ SUSP: 14.0000 [IU] | Freq: Two times a day (BID) | SUBCUTANEOUS | 3 refills | Status: DC

## 2023-06-11 NOTE — Progress Notes (Signed)
Name: Derek Mckenzie  Age/ Sex: 67 y.o., male   MRN/ DOB: [id number], [birth year]     PCP: Ignatius Specking, MD   Reason for Endocrinology Evaluation: Type 2 Diabetes Mellitus  Initial Endocrine Consultative Visit: 05/16/2019    PATIENT IDENTIFIER: Mr. Derek Mckenzie is a 67 y.o. male with a past medical history of HTN, T2DM, cardiomyopathy and dyslipidemia. The patient has followed with Endocrinology clinic since 05/16/2019 for consultative assistance with management of his diabetes.  DIABETIC HISTORY:  Mr. Derek Mckenzie was diagnosed with T2DM > 20 yrs ago. He does not recall prior oral glycemic agents, he has been on insulin for years.  His hemoglobin A1c has ranged from 12.2% in 2016, peaking at 13.1% in  2019   On his initial visit to our clinic, his A1c was 12.2% and he was on Novolin- N only. We started him on Novolin- Mix   He declined switching to Vials in 2022  SUBJECTIVE:   During the last visit (12/09/2022): A1c 6.1%     Today (06/11/2023): Mr. Derek Mckenzie is here for a  follow up on diabetes management.  He checks his blood sugars 2 times daily, preprandial to breakfast and supper.  The patient has has  had hypoglycemic episodes since the last clinic visit.    He is  on Jardiance through cardiology He had a colonoscopy since his last visit here He continues to follow-up with ophthalmology for glaucoma He follows with podiatry every 3 months He denies any nausea or vomiting or abdominal pain He denies constipation or diarrhea   HOME DIABETES REGIMEN:  Novolin Mix (70/30) 14 units BID with Breakfast and supper   Jardiance 10 mg daily - per cardio   METER DOWNLOAD SUMMARY: 5/15 - 06/11/2023  Overall Mean FS Glucose = 144 Standard Deviation = 34  BG Ranges: Low = 74 High = 247   Hypoglycemic Events/30 Days: BG < 50 = 0 Episodes of symptomatic severe hypoglycemia = 0     DIABETIC COMPLICATIONS: Microvascular complications:  CKD III Denies: neuropathy,  retinopathy Last eye exam: Completed 04/02/2023   Macrovascular complications:  CHF, PVD Denies: CAD, CVA    HISTORY:  Past Medical History:  Past Medical History:  Diagnosis Date   CHF (congestive heart failure) (HCC)    Deafness in left ear    ED (erectile dysfunction)    Hearing loss in right ear    Hyperlipidemia    Hypertension    PAD (peripheral artery disease) (HCC)    Type II diabetes mellitus (HCC)    Past Surgical History:  Past Surgical History:  Procedure Laterality Date   CATARACT EXTRACTION W/PHACO Left 03/11/2016   Procedure: CATARACT EXTRACTION PHACO AND INTRAOCULAR LENS PLACEMENT (IOC);  Surgeon: Jethro Bolus, MD;  Location: [facility];  Service: Ophthalmology;  Laterality: Left;  CDE:2.71   CATARACT EXTRACTION W/PHACO Right 05/20/2016   Procedure: CATARACT EXTRACTION PHACO AND INTRAOCULAR LENS PLACEMENT (IOC);  Surgeon: Jethro Bolus, MD;  Location: [facility];  Service: Ophthalmology;  Laterality: Right;  CDE: 4.33   COLONOSCOPY N/A 01/20/2020   Procedure: COLONOSCOPY;  Surgeon: West Bali, MD;  Location: [facility];  Service: Endoscopy;  Laterality: N/A;  8:30am   COLONOSCOPY WITH PROPOFOL N/A 02/16/2023   Procedure: COLONOSCOPY WITH PROPOFOL;  Surgeon: Lanelle Bal, DO;  Location: [facility];  Service: Endoscopy;  Laterality: N/A;  1030am, asa 3   POLYPECTOMY  01/20/2020   Procedure: POLYPECTOMY;  Surgeon: West Bali, MD;  Location: [facility];  Service: Endoscopy;;   POLYPECTOMY  02/16/2023   Procedure: POLYPECTOMY;  Surgeon: Lanelle Bal, DO;  [facility];  Service: Endoscopy;;   RETINAL LASER PROCEDURE     VASECTOMY     YAG LASER APPLICATION Right 02/24/2017   Procedure: YAG LASER APPLICATION;  Surgeon: Jethro Bolus, MD;  Location: [facility];  Service: Ophthalmology;  Laterality: Right;   Social History:  reports that he has quit smoking. His smoking use included cigarettes. He has a 17.50 pack-year smoking history. He has never  used smokeless tobacco. He reports that he does not drink alcohol and does not use drugs. Family History:  Family History  Problem Relation Age of Onset   Hypertension Mother    Stroke Mother    Heart disease Father    Other Neg Hx        gynecomastia   Colon cancer Neg Hx    Colon polyps Neg Hx      HOME MEDICATIONS: Allergies as of 06/11/2023       Reactions   Atenolol Shortness Of Breath   Amlodipine Besy-benazepril Hcl Other (See Comments)   Headaches        Medication List        Accurate as of June 11, 2023 10:02 AM. If you have any questions, ask your nurse or doctor.          Alpha-Lipoic Acid 200 MG Caps Take 200 mg by mouth daily.   amLODipine 10 MG tablet Commonly known as: NORVASC TAKE 1 TABLET BY MOUTH ONCE DAILY   atorvastatin 80 MG tablet Commonly known as: LIPITOR TAKE 1 TABLET BY MOUTH ONCE DAILY.   carvedilol 25 MG tablet Commonly known as: COREG take 1 tablet 2 times a day with a meal.   cyanocobalamin 1000 MCG tablet Commonly known as: VITAMIN B12 Take 1,000 mcg by mouth daily.   Entresto 49-51 MG Generic drug: sacubitril-valsartan Take 1 tablet by mouth 2 (two) times daily. Must keep appointments for further refills   ezetimibe 10 MG tablet Commonly known as: ZETIA Take 1 tablet (10 mg total) by mouth daily. NEEDS FOLLOW UP APPOINTMENT FOR MORE REFILLS   fenofibrate 145 MG tablet Commonly known as: TRICOR TAKE 1 TABLET BY MOUTH DAILY--NEEDS FOLLOW UP APPOINTMENT FOR ANYMORE REFILLS.   folic acid 400 MCG tablet Commonly known as: FOLVITE Take 400 mcg by mouth daily.   hydrALAZINE 100 MG tablet Commonly known as: APRESOLINE Take 1 tablet (100 mg total) by mouth 3 (three) times daily. NEEDS FOLLOW UP APPOINTMENT FOR MORE REFILLS   Insulin Syringe-Needle U-100 31G X 15/64" 0.3 ML Misc 1 Device by Does not apply route in the morning and at bedtime. Started by: Scarlette Shorts, MD   isosorbide mononitrate 60 MG 24 hr  tablet Commonly known as: IMDUR Take 1 tablet (60 mg total) by mouth daily. NEEDS FOLLOW UP APPOINTMENT FOR MORE REFILLS   Jardiance 10 MG Tabs tablet Generic drug: empagliflozin TAKE 1 TABLET DAILY BEFORE BREAKFAST.   latanoprost 0.005 % ophthalmic solution Commonly known as: XALATAN Place 1 drop into both eyes at bedtime.   NovoLIN 70/30 ReliOn (70-30) 100 UNIT/ML injection Generic drug: insulin NPH-regular Human Inject 14 Units into the skin 2 (two) times daily with a meal.   spironolactone 25 MG tablet Commonly known as: ALDACTONE Take 0.5 tablets (12.5 mg total) by mouth daily. NEEDS FOLLOW UP APPOINTMENT FOR MORE REFILLS         OBJECTIVE:   Vital  Signs: BP 126/80 (BP Location: Left Arm, Patient Position: Sitting, Cuff Size: Large)   Pulse 70   Ht 5\' 5"  (1.651 m)   Wt 185 lb (83.9 kg)   SpO2 94%   BMI 30.79 kg/m   Wt Readings from Last 3 Encounters:  06/11/23 185 lb (83.9 kg)  02/11/23 187 lb 9.6 oz (85.1 kg)  02/05/23 187 lb 9.6 oz (85.1 kg)     Exam: General: Pt appears well and is in NAD  Lungs: Clear with good BS bilat with no rales, rhonchi, or wheezes  Heart: RRR   Extremities: Trace pretibial edema.   Neuro: MS is good with appropriate affect, pt is alert and Ox3     DM foot exam: 06/11/2023 The skin of the feet is  without sores or ulcerations.Bilateral planter callous formation noted and thickened, discolored toe nails The pedal pulses are absent  The sensation is absent   to a screening 5.07, 10 gram monofilament bilaterally            DATA REVIEWED:  Lab Results  Component Value Date   HGBA1C 7.2 (A) 06/11/2023   HGBA1C 6.1 (A) 12/09/2022   HGBA1C 7.0 (A) 06/19/2022    Latest Reference Range & Units 02/11/23 12:58  Sodium 135 - 145 mmol/L 139  Potassium 3.5 - 5.1 mmol/L 4.6  Chloride 98 - 111 mmol/L 110  CO2 22 - 32 mmol/L 20 (L)  Glucose 70 - 99 mg/dL 66 (L)  BUN 8 - 23 mg/dL 41 (H)  Creatinine 1.61 - 1.24 mg/dL 0.96 (H)   Calcium 8.9 - 10.3 mg/dL 9.0  Anion gap 5 - 15  9  GFR, Estimated >60 mL/min 27 (L)  (L): Data is abnormally low (H): Data is abnormally high    ASSESSMENT / PLAN / RECOMMENDATIONS:   1) Type 2 Diabetes Mellitus, OPtimally controlled, With CKD III complications and PVD - Most recent A1c of 7.2 %. Goal A1c < 7.0 %.   -A1c has trended up, but there is no hypoglycemic episodes - Dexcom has been cost prohibitive - Freestyle libre was inaccurate with 40-60 points lower then finger stick -Historically unable to add glycemic agents due to concerns about the cost -He is on Jardiance through cardiology -No changes at this time   MEDICATIONS: - Continue  Novolin Mix (70/30) 14 units with Breakfast and with supper    EDUCATION / INSTRUCTIONS: BG monitoring instructions: Patient is instructed to check his blood sugars 2 times a day, before breakfast and supper. Call [facility] if: BG persistently < 70 I reviewed the Rule of 15 for the treatment of hypoglycemia in detail with the patient. Literature supplied.    2) Dyslipidemia:  -Per cardiology  F/U  6 months     Signed electronically by: Lyndle Herrlich, MD  [facility] Endocrinology  [facility] [address] [place] [place], [place] [address] Phone: [phone] FAX: [phone]   CC: Ignatius Specking, MD [address] [place] [address] Phone: [phone]  Fax: [phone]  Return to Endocrinology clinic as below: Future Appointments  Date Time Provider Department Center  06/12/2023 11:00 AM [facility] PA/NP [facility] None

## 2023-06-11 NOTE — Patient Instructions (Signed)
-   Continue Novolin -Mix  14 units with Breakfast and 14  units with Supper     - HOW TO TREAT LOW BLOOD SUGARS (Blood sugar LESS THAN 70 MG/DL) Please follow the RULE OF 15 for the treatment of hypoglycemia treatment (when your (blood sugars are less than 70 mg/dL)   STEP 1: Take 15 grams of carbohydrates when your blood sugar is low, which includes:  3-4 GLUCOSE TABS  OR 3-4 OZ OF JUICE OR REGULAR SODA OR ONE TUBE OF GLUCOSE GEL    STEP 2: RECHECK blood sugar in 15 MINUTES STEP 3: If your blood sugar is still low at the 15 minute recheck --> then, go back to STEP 1 and treat AGAIN with another 15 grams of carbohydrates. 

## 2023-06-12 ENCOUNTER — Encounter (HOSPITAL_COMMUNITY): Payer: Self-pay

## 2023-06-12 ENCOUNTER — Ambulatory Visit (HOSPITAL_COMMUNITY)
Admission: RE | Admit: 2023-06-12 | Discharge: 2023-06-12 | Disposition: A | Discharge status: Home / Self Care | Payer: PPO | Source: Ambulatory Visit | Attending: Family Medicine | Admitting: Family Medicine

## 2023-06-12 ENCOUNTER — Other Ambulatory Visit (HOSPITAL_COMMUNITY): Payer: Self-pay

## 2023-06-12 VITALS — BP 148/62 | HR 80 | Wt 185.0 lb

## 2023-06-12 DIAGNOSIS — Z79899 Other long term (current) drug therapy: Secondary | ICD-10-CM | POA: Insufficient documentation

## 2023-06-12 DIAGNOSIS — E1151 Type 2 diabetes mellitus with diabetic peripheral angiopathy without gangrene: Secondary | ICD-10-CM | POA: Insufficient documentation

## 2023-06-12 DIAGNOSIS — E785 Hyperlipidemia, unspecified: Secondary | ICD-10-CM | POA: Diagnosis not present

## 2023-06-12 DIAGNOSIS — Z7984 Long term (current) use of oral hypoglycemic drugs: Secondary | ICD-10-CM | POA: Diagnosis not present

## 2023-06-12 DIAGNOSIS — I739 Peripheral vascular disease, unspecified: Secondary | ICD-10-CM | POA: Diagnosis not present

## 2023-06-12 DIAGNOSIS — Z794 Long term (current) use of insulin: Secondary | ICD-10-CM | POA: Diagnosis not present

## 2023-06-12 DIAGNOSIS — I130 Hypertensive heart and chronic kidney disease with heart failure and stage 1 through stage 4 chronic kidney disease, or unspecified chronic kidney disease: Secondary | ICD-10-CM | POA: Diagnosis not present

## 2023-06-12 DIAGNOSIS — I5022 Chronic systolic (congestive) heart failure: Secondary | ICD-10-CM | POA: Insufficient documentation

## 2023-06-12 DIAGNOSIS — Z87891 Personal history of nicotine dependence: Secondary | ICD-10-CM | POA: Insufficient documentation

## 2023-06-12 DIAGNOSIS — E782 Mixed hyperlipidemia: Secondary | ICD-10-CM

## 2023-06-12 DIAGNOSIS — N183 Chronic kidney disease, stage 3 unspecified: Secondary | ICD-10-CM | POA: Diagnosis not present

## 2023-06-12 DIAGNOSIS — I10 Essential (primary) hypertension: Secondary | ICD-10-CM | POA: Diagnosis not present

## 2023-06-12 DIAGNOSIS — E1122 Type 2 diabetes mellitus with diabetic chronic kidney disease: Secondary | ICD-10-CM | POA: Insufficient documentation

## 2023-06-12 LAB — BASIC METABOLIC PANEL
Anion gap: 7 (ref 5–15)
BUN: 38 mg/dL — ABNORMAL HIGH (ref 8–23)
CO2: 22 mmol/L (ref 22–32)
Calcium: 8.9 mg/dL (ref 8.9–10.3)
Chloride: 110 mmol/L (ref 98–111)
Creatinine, Ser: 2.67 mg/dL — ABNORMAL HIGH (ref 0.61–1.24)
GFR, Estimated: 25 mL/min — ABNORMAL LOW (ref >60)
Glucose, Bld: 94 mg/dL (ref 70–99)
Potassium: 4.8 mmol/L (ref 3.5–5.1)
Sodium: 139 mmol/L (ref 135–145)

## 2023-06-12 MED ORDER — EMPAGLIFLOZIN 10 MG PO TABS: 10.0000 mg | ORAL_TABLET | Freq: Every day | ORAL | 3 refills | Status: DC

## 2023-06-12 MED ORDER — ATORVASTATIN CALCIUM 80 MG PO TABS: 80.0000 mg | ORAL_TABLET | Freq: Every day | ORAL | 3 refills | Status: DC

## 2023-06-12 MED ORDER — AMLODIPINE BESYLATE 10 MG PO TABS: 10.0000 mg | ORAL_TABLET | Freq: Every day | ORAL | 3 refills | Status: DC

## 2023-06-12 MED ORDER — ENTRESTO 49-51 MG PO TABS: 1.0000 | ORAL_TABLET | Freq: Two times a day (BID) | ORAL | 3 refills | Status: DC

## 2023-06-12 MED ORDER — EZETIMIBE 10 MG PO TABS: 10.0000 mg | ORAL_TABLET | Freq: Every day | ORAL | 3 refills | Status: DC

## 2023-06-12 MED ORDER — ISOSORBIDE MONONITRATE ER 60 MG PO TB24: 60.0000 mg | ORAL_TABLET | Freq: Every day | ORAL | 3 refills | Status: DC

## 2023-06-12 MED ORDER — FENOFIBRATE 145 MG PO TABS: 145.0000 mg | ORAL_TABLET | Freq: Every day | ORAL | 3 refills | Status: DC

## 2023-06-12 MED ORDER — CARVEDILOL 25 MG PO TABS: 25.0000 mg | ORAL_TABLET | Freq: Two times a day (BID) | ORAL | 3 refills | Status: DC

## 2023-06-12 MED ORDER — SPIRONOLACTONE 25 MG PO TABS: 12.5000 mg | ORAL_TABLET | Freq: Every day | ORAL | 3 refills | Status: DC

## 2023-06-12 NOTE — Patient Instructions (Signed)
There has been no changes to your medications.  Labs done today, your results will be available in MyChart, we will contact you for abnormal readings.  Your physician has requested that you have an echocardiogram. Echocardiography is a painless test that uses sound waves to create images of your heart. It provides your doctor with information about the size and shape of your heart and how well your heart's chambers and valves are working. This procedure takes approximately one hour. There are no restrictions for this procedure. Please do NOT wear cologne, perfume, aftershave, or lotions (deodorant is allowed). Please arrive 15 minutes prior to your appointment time.   You have been referred to Dr.Berry's office. They should contact you to arrange your appointment.  Your physician recommends that you schedule a follow-up appointment in: 4 months with an echocardiogram (October) ** please contact the office in August to arrange your follow up appointment.  If you have any questions or concerns before your next appointment please send [place] a message through [place] or call our office at [phone].    TO LEAVE A MESSAGE FOR THE NURSE SELECT OPTION 2, PLEASE LEAVE A MESSAGE INCLUDING: YOUR NAME DATE OF BIRTH CALL BACK NUMBER REASON FOR CALL**this is important as we prioritize the call backs  YOU WILL RECEIVE A CALL BACK THE SAME DAY AS LONG AS YOU CALL BEFORE 4:00 PM  At the [facility], you and your health needs are our priority. As part of our continuing mission to provide you with exceptional heart care, we have created designated Provider Care Teams. These Care Teams include your primary Cardiologist (physician) and Advanced Practice Providers (APPs- Physician Assistants and Nurse Practitioners) who all work together to provide you with the care you need, when you need it.   You may see any of the following providers on your designated Care Team at your next follow up: Dr  Arvilla Meres Dr Marca Ancona Dr. Marcos Eke, NP Robbie Lis, [place] [facility] [place], [place] Brynda Peon, NP Karle Plumber, PharmD   Please be sure to bring in all your medications bottles to every appointment.    Thank you for choosing [facility]

## 2023-06-16 DIAGNOSIS — M79676 Pain in unspecified toe(s): Secondary | ICD-10-CM | POA: Diagnosis not present

## 2023-06-16 DIAGNOSIS — B351 Tinea unguium: Secondary | ICD-10-CM | POA: Diagnosis not present

## 2023-06-16 DIAGNOSIS — L84 Corns and callosities: Secondary | ICD-10-CM | POA: Diagnosis not present

## 2023-06-16 DIAGNOSIS — E1142 Type 2 diabetes mellitus with diabetic polyneuropathy: Secondary | ICD-10-CM | POA: Diagnosis not present

## 2023-07-03 ENCOUNTER — Other Ambulatory Visit: Payer: Self-pay | Admitting: Cardiology

## 2023-07-06 ENCOUNTER — Other Ambulatory Visit: Payer: Self-pay | Admitting: Cardiology

## 2023-07-07 ENCOUNTER — Telehealth (HOSPITAL_COMMUNITY): Payer: Self-pay | Admitting: Pharmacist

## 2023-07-07 MED ORDER — ISOSORBIDE MONONITRATE ER 60 MG PO TB24: 60.0000 mg | ORAL_TABLET | Freq: Two times a day (BID) | ORAL | 3 refills | Status: DC

## 2023-07-07 NOTE — Telephone Encounter (Signed)
Received message from [facility]. Message states that they received prescription from our office for Imdur with instructions to take 60 mg (1 tablet) daily. Patient told them the correct directions should be 60 mg BID. Reviewed chart and patient is correct, dose should be 60 mg BID. It appears instructions may have gotten changed incorrectly when an interim supply was submitted (patient needed to be seen before more refills would be sent).   Updated prescription for Imdur 60 mg BID send to [facility] per patient request. Patient expressed understanding.   Karle Plumber, PharmD, BCPS, BCCP, CPP Heart Failure Clinic Pharmacist [phone]

## 2023-08-04 ENCOUNTER — Other Ambulatory Visit: Payer: Self-pay | Admitting: Cardiology

## 2023-08-04 DIAGNOSIS — Z79899 Other long term (current) drug therapy: Secondary | ICD-10-CM | POA: Diagnosis not present

## 2023-08-04 DIAGNOSIS — Z125 Encounter for screening for malignant neoplasm of prostate: Secondary | ICD-10-CM | POA: Diagnosis not present

## 2023-08-04 DIAGNOSIS — E7800 Pure hypercholesterolemia, unspecified: Secondary | ICD-10-CM | POA: Diagnosis not present

## 2023-08-04 DIAGNOSIS — R5383 Other fatigue: Secondary | ICD-10-CM | POA: Diagnosis not present

## 2023-08-10 DIAGNOSIS — H401132 Primary open-angle glaucoma, bilateral, moderate stage: Secondary | ICD-10-CM | POA: Diagnosis not present

## 2023-08-10 DIAGNOSIS — H524 Presbyopia: Secondary | ICD-10-CM | POA: Diagnosis not present

## 2023-08-19 ENCOUNTER — Encounter: Payer: Self-pay | Admitting: Cardiovascular Disease

## 2023-08-19 ENCOUNTER — Ambulatory Visit: Payer: PPO | Attending: Cardiovascular Disease | Admitting: Cardiovascular Disease

## 2023-08-19 VITALS — BP 122/62 | HR 84 | Ht 65.0 in | Wt 183.2 lb

## 2023-08-19 DIAGNOSIS — I739 Peripheral vascular disease, unspecified: Secondary | ICD-10-CM | POA: Diagnosis not present

## 2023-08-19 NOTE — Assessment & Plan Note (Signed)
Mr. Boni was referred back to me for evaluation of PAD.  I last saw him in the office 01/21/2017.  That time his claudication was not lifestyle limiting and his Dopplers revealed multilevel disease.  His creatinine at that time was in the 1.6-1.7 range.  Currently his creatinine is in the mid to high 2 range with a creatinine clearance of 25.  His most recent Doppler studies show a right ABI of 0.53 and a left of 0.72 with monophasic waveforms on the right and biphasic on the left.  At this point, his symptoms do not warrant an invasive evaluation and I would be hesitant to do this anyway in a diabetic with a creatinine clearance of 25 cc.  Going to get aortoiliac and lower extremity actual Doppler studies to better define his PAD.

## 2023-08-19 NOTE — Patient Instructions (Signed)
Medication Instructions:  NO CHANGES  *If you need a refill on your cardiac medications before your next appointment, please call your pharmacy*   Testing/Procedures: Lower extremity arterial doppler and aorta/iliac doppler    Follow-Up: At [facility], you and your health needs are our priority.  As part of our continuing mission to provide you with exceptional heart care, we have created designated Provider Care Teams.  These Care Teams include your primary Cardiologist (physician) and Advanced Practice Providers (APPs -  Physician Assistants and Nurse Practitioners) who all work together to provide you with the care you need, when you need it.  We recommend signing up for the patient portal called "MyChart".  Sign up information is provided on this After Visit Summary.  MyChart is used to connect with patients for Virtual Visits (Telemedicine).  Patients are able to view lab/test results, encounter notes, upcoming appointments, etc.  Non-urgent messages can be sent to your provider as well.   To learn more about what you can do with MyChart, go to [email or web address].    Your next appointment:   AS NEEDED with Dr. Allyson Sabal

## 2023-08-19 NOTE — Progress Notes (Signed)
08/19/2023 Derek Mckenzie   05-Apr-1956  [id number]  Primary Physician Ignatius Specking, MD Primary Cardiologist: Runell Gess MD Nicholes Calamity, [place]  HPI:  Derek Mckenzie is a 67 y.o.   moderately overweight recently widowed African-American male father of 3 children referred by Dr. Shirlee Latch for peripheral vascular evaluation because of claudication and abnormal Doppler studies. He does have a history of remote tobacco abuse having quit in 2007 having smoked 30 pack years, treated hypertension, diabetes and hyperlipidemia. He has never had a heart attack or stroke. He does complain of some claudication which is not lifestyle limiting. He did have Doppler studies performed oh/27/17 that showed multilevel disease bilaterally. His serum creatinine runs in the 1.6-1.7 range.  I last saw him in the office 01/21/2017.  He was referred back to me by Prince Rome FNP for reevaluation of PAD.  His renal function has deteriorated over the last 6 years.  When asked about the severity of symptoms he says they are not lifestyle limiting.  He has no evidence of critical limb ischemia.  His last Doppler studies performed 11/03/2022 revealed a right ABI of 0.53 and a left of 0.72.   Current Meds  Medication Sig   Alpha-Lipoic Acid 200 MG CAPS Take 200 mg by mouth daily.    amLODipine (NORVASC) 10 MG tablet Take 1 tablet (10 mg total) by mouth daily.   atorvastatin (LIPITOR) 80 MG tablet Take 1 tablet (80 mg total) by mouth daily.   carvedilol (COREG) 25 MG tablet Take 1 tablet (25 mg total) by mouth 2 (two) times daily with a meal.   empagliflozin (JARDIANCE) 10 MG TABS tablet Take 1 tablet (10 mg total) by mouth daily before breakfast.   ezetimibe (ZETIA) 10 MG tablet Take 1 tablet (10 mg total) by mouth daily.   fenofibrate (TRICOR) 145 MG tablet Take 1 tablet (145 mg total) by mouth daily.   folic acid (FOLVITE) 400 MCG tablet Take 400 mcg by mouth daily.    hydrALAZINE (APRESOLINE) 100 MG  tablet take 1 tablet (100 milligram total) by mouth 3 (three) times daily. needs follow up appointment for more refills   insulin NPH-regular Human (NOVOLIN 70/30 RELION) (70-30) 100 UNIT/ML injection Inject 14 Units into the skin 2 (two) times daily with a meal.   Insulin Syringe-Needle U-100 31G X 15/64" 0.3 ML MISC 1 Device by Does not apply route in the morning and at bedtime.   isosorbide mononitrate (IMDUR) 60 MG 24 hr tablet Take 1 tablet (60 mg total) by mouth in the morning and at bedtime.   latanoprost (XALATAN) 0.005 % ophthalmic solution Place 1 drop into both eyes at bedtime.   sacubitril-valsartan (ENTRESTO) 49-51 MG Take 1 tablet by mouth 2 (two) times daily.   spironolactone (ALDACTONE) 25 MG tablet Take 0.5 tablets (12.5 mg total) by mouth daily.   vitamin B-12 (CYANOCOBALAMIN) 1000 MCG tablet Take 1,000 mcg by mouth daily.      Allergies  Allergen Reactions   Atenolol Shortness Of Breath   Amlodipine Besy-Benazepril Hcl Other (See Comments)    Headaches    Social History   Socioeconomic History   Marital status: Single    Spouse name: Not on file   Number of children: Not on file   Years of education: Not on file   Highest education level: Not on file  Occupational History   Not on file  Tobacco Use   Smoking status: Former    Current packs/day:  0.50    Average packs/day: 0.5 packs/day for 35.0 years (17.5 ttl pk-yrs)    Types: Cigarettes   Smokeless tobacco: Never   Tobacco comments:    "quit smoking in ~ 2007"  Vaping Use   Vaping status: Never Used  Substance and Sexual Activity   Alcohol use: No   Drug use: No   Sexual activity: Never  Other Topics Concern   Not on file  Social History Narrative   IN A LONG TERM PLATONIC RELATIONSHIP. BEEN GOOD FRIENDS FOR YEARS AND TAKING CARE OF EACH OTHER. SEES KIDS VERY RARELY. 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 HEARING AID DID NOT WORK.   Social Determinants of Health   Financial  Resource Strain: Not on file  Food Insecurity: Not on file  Transportation Needs: Not on file  Physical Activity: Not on file  Stress: Not on file  Social Connections: Not on file  Intimate Partner Violence: Not on file     Review of Systems: General: negative for chills, fever, night sweats or weight changes.  Cardiovascular: negative for chest pain, dyspnea on exertion, edema, orthopnea, palpitations, paroxysmal nocturnal dyspnea or shortness of breath Dermatological: negative for rash Respiratory: negative for cough or wheezing Urologic: negative for hematuria Abdominal: negative for nausea, vomiting, diarrhea, bright red blood per rectum, melena, or hematemesis Neurologic: negative for visual changes, syncope, or dizziness All other systems reviewed and are otherwise negative except as noted above.    Blood pressure 122/62, pulse 84, height 5\' 5"  (1.651 m), weight 183 lb 3.2 oz (83.1 kg), SpO2 93%.  General appearance: alert and no distress Neck: no adenopathy, no carotid bruit, no JVD, supple, symmetrical, trachea midline, and thyroid not enlarged, symmetric, no tenderness/mass/nodules Lungs: clear to auscultation bilaterally Heart: regular rate and rhythm, S1, S2 normal, no murmur, click, rub or gallop Extremities: extremities normal, atraumatic, no cyanosis or edema Pulses: Diminished pedal pulses bilaterally Skin: Skin color, texture, turgor normal. No rashes or lesions Neurologic: Grossly normal  EKG not performed today      ASSESSMENT AND PLAN:   PAD (peripheral artery disease) (HCC) Mr. Hege was referred back to me for evaluation of PAD.  I last saw him in the office 01/21/2017.  That time his claudication was not lifestyle limiting and his Dopplers revealed multilevel disease.  His creatinine at that time was in the 1.6-1.7 range.  Currently his creatinine is in the mid to high 2 range with a creatinine clearance of 25.  His most recent Doppler studies show a right  ABI of 0.53 and a left of 0.72 with monophasic waveforms on the right and biphasic on the left.  At this point, his symptoms do not warrant an invasive evaluation and I would be hesitant to do this anyway in a diabetic with a creatinine clearance of 25 cc.  Going to get aortoiliac and lower extremity actual Doppler studies to better define his PAD.     Runell Gess MD FACP,FACC,FAHA, [facility] 08/19/2023 3:10 PM

## 2023-08-27 DIAGNOSIS — E1165 Type 2 diabetes mellitus with hyperglycemia: Secondary | ICD-10-CM | POA: Diagnosis not present

## 2023-08-27 DIAGNOSIS — Z299 Encounter for prophylactic measures, unspecified: Secondary | ICD-10-CM | POA: Diagnosis not present

## 2023-08-27 DIAGNOSIS — Z0000 Encounter for general adult medical examination without abnormal findings: Secondary | ICD-10-CM | POA: Diagnosis not present

## 2023-08-27 DIAGNOSIS — I10 Essential (primary) hypertension: Secondary | ICD-10-CM | POA: Diagnosis not present

## 2023-08-27 DIAGNOSIS — Z794 Long term (current) use of insulin: Secondary | ICD-10-CM | POA: Diagnosis not present

## 2023-08-27 DIAGNOSIS — Z87891 Personal history of nicotine dependence: Secondary | ICD-10-CM | POA: Diagnosis not present

## 2023-08-27 DIAGNOSIS — N184 Chronic kidney disease, stage 4 (severe): Secondary | ICD-10-CM | POA: Diagnosis not present

## 2023-09-01 ENCOUNTER — Other Ambulatory Visit: Payer: Self-pay | Admitting: Cardiology

## 2023-09-03 DIAGNOSIS — I129 Hypertensive chronic kidney disease with stage 1 through stage 4 chronic kidney disease, or unspecified chronic kidney disease: Secondary | ICD-10-CM | POA: Diagnosis not present

## 2023-09-03 DIAGNOSIS — N184 Chronic kidney disease, stage 4 (severe): Secondary | ICD-10-CM | POA: Diagnosis not present

## 2023-09-03 DIAGNOSIS — E1122 Type 2 diabetes mellitus with diabetic chronic kidney disease: Secondary | ICD-10-CM | POA: Diagnosis not present

## 2023-09-03 DIAGNOSIS — E8722 Chronic metabolic acidosis: Secondary | ICD-10-CM | POA: Diagnosis not present

## 2023-09-07 ENCOUNTER — Other Ambulatory Visit (HOSPITAL_COMMUNITY): Payer: Self-pay | Admitting: Nephrology

## 2023-09-07 DIAGNOSIS — N184 Chronic kidney disease, stage 4 (severe): Secondary | ICD-10-CM

## 2023-09-14 ENCOUNTER — Ambulatory Visit (HOSPITAL_COMMUNITY)
Admission: RE | Admit: 2023-09-14 | Discharge: 2023-09-14 | Disposition: A | Discharge status: Home / Self Care | Payer: PPO | Source: Ambulatory Visit | Attending: Nephrology | Admitting: Nephrology

## 2023-09-14 DIAGNOSIS — N184 Chronic kidney disease, stage 4 (severe): Secondary | ICD-10-CM | POA: Diagnosis not present

## 2023-09-14 DIAGNOSIS — N2889 Other specified disorders of kidney and ureter: Secondary | ICD-10-CM | POA: Diagnosis not present

## 2023-09-14 DIAGNOSIS — N281 Cyst of kidney, acquired: Secondary | ICD-10-CM | POA: Diagnosis not present

## 2023-09-22 DIAGNOSIS — E1142 Type 2 diabetes mellitus with diabetic polyneuropathy: Secondary | ICD-10-CM | POA: Diagnosis not present

## 2023-09-22 DIAGNOSIS — L84 Corns and callosities: Secondary | ICD-10-CM | POA: Diagnosis not present

## 2023-09-22 DIAGNOSIS — M79676 Pain in unspecified toe(s): Secondary | ICD-10-CM | POA: Diagnosis not present

## 2023-09-22 DIAGNOSIS — B351 Tinea unguium: Secondary | ICD-10-CM | POA: Diagnosis not present

## 2023-09-24 ENCOUNTER — Ambulatory Visit: Payer: PPO

## 2023-09-25 ENCOUNTER — Other Ambulatory Visit (HOSPITAL_COMMUNITY): Payer: Self-pay | Admitting: Nephrology

## 2023-09-25 DIAGNOSIS — N184 Chronic kidney disease, stage 4 (severe): Secondary | ICD-10-CM

## 2023-09-25 DIAGNOSIS — E781 Pure hyperglyceridemia: Secondary | ICD-10-CM

## 2023-09-25 DIAGNOSIS — E875 Hyperkalemia: Secondary | ICD-10-CM | POA: Diagnosis not present

## 2023-09-25 DIAGNOSIS — I5032 Chronic diastolic (congestive) heart failure: Secondary | ICD-10-CM | POA: Diagnosis not present

## 2023-09-25 DIAGNOSIS — E8722 Chronic metabolic acidosis: Secondary | ICD-10-CM | POA: Diagnosis not present

## 2023-09-25 DIAGNOSIS — E118 Type 2 diabetes mellitus with unspecified complications: Secondary | ICD-10-CM

## 2023-09-25 DIAGNOSIS — E7800 Pure hypercholesterolemia, unspecified: Secondary | ICD-10-CM

## 2023-09-25 DIAGNOSIS — D638 Anemia in other chronic diseases classified elsewhere: Secondary | ICD-10-CM | POA: Diagnosis not present

## 2023-09-28 DIAGNOSIS — E1140 Type 2 diabetes mellitus with diabetic neuropathy, unspecified: Secondary | ICD-10-CM | POA: Diagnosis not present

## 2023-09-28 DIAGNOSIS — I5022 Chronic systolic (congestive) heart failure: Secondary | ICD-10-CM | POA: Diagnosis not present

## 2023-09-28 DIAGNOSIS — N2889 Other specified disorders of kidney and ureter: Secondary | ICD-10-CM | POA: Diagnosis not present

## 2023-09-28 DIAGNOSIS — Z299 Encounter for prophylactic measures, unspecified: Secondary | ICD-10-CM | POA: Diagnosis not present

## 2023-09-28 DIAGNOSIS — I10 Essential (primary) hypertension: Secondary | ICD-10-CM | POA: Diagnosis not present

## 2023-10-01 ENCOUNTER — Other Ambulatory Visit: Payer: Self-pay | Admitting: Cardiology

## 2023-10-02 ENCOUNTER — Ambulatory Visit (HOSPITAL_COMMUNITY)
Admission: RE | Admit: 2023-10-02 | Discharge: 2023-10-02 | Disposition: A | Discharge status: Home / Self Care | Payer: PPO | Source: Ambulatory Visit | Attending: Nephrology | Admitting: Nephrology

## 2023-10-02 DIAGNOSIS — E118 Type 2 diabetes mellitus with unspecified complications: Secondary | ICD-10-CM | POA: Insufficient documentation

## 2023-10-02 DIAGNOSIS — E7800 Pure hypercholesterolemia, unspecified: Secondary | ICD-10-CM | POA: Insufficient documentation

## 2023-10-02 DIAGNOSIS — I700 Atherosclerosis of aorta: Secondary | ICD-10-CM | POA: Diagnosis not present

## 2023-10-02 DIAGNOSIS — E781 Pure hyperglyceridemia: Secondary | ICD-10-CM | POA: Diagnosis not present

## 2023-10-02 DIAGNOSIS — N281 Cyst of kidney, acquired: Secondary | ICD-10-CM | POA: Diagnosis not present

## 2023-10-02 DIAGNOSIS — K820 Obstruction of gallbladder: Secondary | ICD-10-CM | POA: Diagnosis not present

## 2023-10-02 DIAGNOSIS — N184 Chronic kidney disease, stage 4 (severe): Secondary | ICD-10-CM | POA: Diagnosis not present

## 2023-10-02 MED ORDER — GADOBUTROL 1 MMOL/ML IV SOLN
8.0000 mL | Freq: Once | INTRAVENOUS | Status: AC | PRN
  Administered 2023-10-02: 8 mL via INTRAVENOUS

## 2023-10-06 ENCOUNTER — Ambulatory Visit: Payer: PPO

## 2023-10-12 ENCOUNTER — Ambulatory Visit (INDEPENDENT_AMBULATORY_CARE_PROVIDER_SITE_OTHER): Payer: PPO

## 2023-10-12 ENCOUNTER — Ambulatory Visit: Payer: PPO | Attending: Cardiovascular Disease

## 2023-10-12 DIAGNOSIS — I70223 Atherosclerosis of native arteries of extremities with rest pain, bilateral legs: Secondary | ICD-10-CM

## 2023-10-12 DIAGNOSIS — I739 Peripheral vascular disease, unspecified: Secondary | ICD-10-CM

## 2023-10-14 LAB — VAS US ABI WITH/WO TBI
Left ABI: 0.51
Right ABI: 0.55

## 2023-10-16 ENCOUNTER — Telehealth: Payer: Self-pay | Admitting: Cardiovascular Disease

## 2023-10-16 NOTE — Telephone Encounter (Signed)
APPT SCHEDULED FOR 10/25 AT 4:30 PM

## 2023-10-16 NOTE — Telephone Encounter (Signed)
Received a message via pt schedule stating:   "i cant walk outside because they hurt bad my lower lleg is what bother me they ace so bad so tiredafter so many step"  "cant walk to the mailbox and back to house without have to stop and let my legs rest"  Please advise.

## 2023-10-16 NOTE — Telephone Encounter (Signed)
Spoke to patient who called with complaints of severe leg pain when walking. Pt reports he has a hard time walking and when he does he has to stop and rest because the pain is so severe. He deny any other symptoms at this time.Report from pain from lowe legs to upper calves. Patient concerned it may be coming from the blockage in his legs.

## 2023-10-23 ENCOUNTER — Ambulatory Visit: Payer: PPO | Attending: Cardiovascular Disease | Admitting: Cardiovascular Disease

## 2023-10-23 ENCOUNTER — Encounter: Payer: Self-pay | Admitting: Cardiovascular Disease

## 2023-10-23 VITALS — BP 135/84 | HR 85 | Ht 65.0 in | Wt 185.4 lb

## 2023-10-23 DIAGNOSIS — I739 Peripheral vascular disease, unspecified: Secondary | ICD-10-CM

## 2023-10-23 NOTE — Progress Notes (Signed)
10/23/2023 Derek Mckenzie   [birth year]  [id number]  Primary Physician Derek Specking, MD Primary Cardiologist: Derek Gess MD Derek Mckenzie, [place]  HPI:  Derek Mckenzie is a 67 y.o.  moderately overweight recently widowed African-American male father of 3 children referred by Derek Mckenzie for peripheral vascular evaluation because of claudication and abnormal Doppler studies.  I last saw him in the office 08/19/2023.  He does have a history of remote tobacco abuse having quit in 2007 having smoked 30 pack years, treated hypertension, diabetes and hyperlipidemia. He has never had a heart attack or stroke. He does complain of some claudication which is not lifestyle limiting. He did have Doppler studies performed oh/27/17 that showed multilevel disease bilaterally. His serum creatinine runs in the 1.6-1.7 range.   I last saw him in the office 2 months ago he did have Doppler studies performed in our office 10/12/2023 revealing a right ABI of 0.55 and a left of 0.51.  He had multilevel disease involving his aorta, femoral and SFA.  His serum creatinine most recently checked on 08/04/2023 was 2.9..   Current Meds  Medication Sig   Alpha-Lipoic Acid 200 MG CAPS Take 200 mg by mouth daily.    amLODipine (NORVASC) 10 MG tablet Take 1 tablet (10 mg total) by mouth daily.   atorvastatin (LIPITOR) 80 MG tablet Take 1 tablet (80 mg total) by mouth daily.   carvedilol (COREG) 25 MG tablet Take 1 tablet (25 mg total) by mouth 2 (two) times daily with a meal.   empagliflozin (JARDIANCE) 10 MG TABS tablet TAKE 1 TABLET BY MOUTH ONCE DAILY BEFORE BREAKFAST   ezetimibe (ZETIA) 10 MG tablet Take 1 tablet (10 mg total) by mouth daily.   fenofibrate (TRICOR) 145 MG tablet Take 1 tablet (145 mg total) by mouth daily.   folic acid (FOLVITE) 400 MCG tablet Take 400 mcg by mouth daily.    gabapentin (NEURONTIN) 100 MG capsule Take 100 mg by mouth daily.   hydrALAZINE (APRESOLINE) 100 MG tablet take 1  tablet (100 milligram total) by mouth 3 (three) times daily. needs follow up appointment for more refills   insulin NPH-regular Human (NOVOLIN 70/30 RELION) (70-30) 100 UNIT/ML injection Inject 14 Units into the skin 2 (two) times daily with a meal.   Insulin Syringe-Needle U-100 31G X 15/64" 0.3 ML MISC 1 Device by Does not apply route in the morning and at bedtime.   isosorbide mononitrate (IMDUR) 60 MG 24 hr tablet Take 1 tablet (60 mg total) by mouth in the morning and at bedtime.   latanoprost (XALATAN) 0.005 % ophthalmic solution Place 1 drop into both eyes at bedtime.   sacubitril-valsartan (ENTRESTO) 49-51 MG Take 1 tablet by mouth 2 (two) times daily.   spironolactone (ALDACTONE) 25 MG tablet Take 0.5 tablets (12.5 mg total) by mouth daily.   vitamin B-12 (CYANOCOBALAMIN) 1000 MCG tablet Take 1,000 mcg by mouth daily.      Allergies  Allergen Reactions   Atenolol Shortness Of Breath   Amlodipine Besy-Benazepril Hcl Other (See Comments)    Headaches    Social History   Socioeconomic History   Marital status: Single    Spouse name: Not on file   Number of children: Not on file   Years of education: Not on file   Highest education level: Not on file  Occupational History   Not on file  Tobacco Use   Smoking status: Former    Current packs/day: 0.50  Average packs/day: 0.5 packs/day for 35.0 years (17.5 ttl pk-yrs)    Types: Cigarettes   Smokeless tobacco: Never   Tobacco comments:    "quit smoking in ~ 2007"  Vaping Use   Vaping status: Never Used  Substance and Sexual Activity   Alcohol use: No   Drug use: No   Sexual activity: Never  Other Topics Concern   Not on file  Social History Narrative   IN A LONG TERM PLATONIC RELATIONSHIP. BEEN GOOD FRIENDS FOR YEARS AND TAKING CARE OF EACH OTHER. SEES KIDS VERY RARELY. 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 HEARING AID DID NOT WORK.   Social Determinants of Health   Financial Resource Strain: Not  on file  Food Insecurity: Not on file  Transportation Needs: Not on file  Physical Activity: Not on file  Stress: Not on file  Social Connections: Not on file  Intimate Partner Violence: Not on file     Review of Systems: General: negative for chills, fever, night sweats or weight changes.  Cardiovascular: negative for chest pain, dyspnea on exertion, edema, orthopnea, palpitations, paroxysmal nocturnal dyspnea or shortness of breath Dermatological: negative for rash Respiratory: negative for cough or wheezing Urologic: negative for hematuria Abdominal: negative for nausea, vomiting, diarrhea, bright red blood per rectum, melena, or hematemesis Neurologic: negative for visual changes, syncope, or dizziness All other systems reviewed and are otherwise negative except as noted above.    Blood pressure 135/84, pulse 85, height 5\' 5"  (1.651 m), weight 185 lb 6.4 oz (84.1 kg), SpO2 97%.  General appearance: alert and no distress Neck: no adenopathy, no carotid bruit, no JVD, supple, symmetrical, trachea midline, and thyroid not enlarged, symmetric, no tenderness/mass/nodules Lungs: clear to auscultation bilaterally Heart: regular rate and rhythm, S1, S2 normal, no murmur, click, rub or gallop Extremities: extremities normal, atraumatic, no cyanosis or edema Pulses: Absent pedal pulses Skin: Skin color, texture, turgor normal. No rashes or lesions Neurologic: Grossly normal  EKG not performed today      ASSESSMENT AND PLAN:   PAD (peripheral artery disease) (HCC) Derek Mckenzie returns today for follow-up of PAD.  He did have lower extremity arterial Doppler studies performed in our office 10/12/2023 revealing a right ABI of 0.55 and a left of 0.51.  He did have multilevel disease with iliac, femoral and SFA disease.  His serum creatinine is near 3.  He is a hypertensive, diabetic patient making him high risk for radiocontrast nephropathy.  I have reviewed the case with Derek Mckenzie and we  both agree that conservative care would be his best option.  I will see him back as needed.     Derek Gess MD FACP,FACC,FAHA, [facility] 10/23/2023 4:48 PM

## 2023-10-23 NOTE — Assessment & Plan Note (Signed)
Derek Mckenzie returns today for follow-up of PAD.  He did have lower extremity arterial Doppler studies performed in our office 10/12/2023 revealing a right ABI of 0.55 and a left of 0.51.  He did have multilevel disease with iliac, femoral and SFA disease.  His serum creatinine is near 3.  He is a hypertensive, diabetic patient making him high risk for radiocontrast nephropathy.  I have reviewed the case with Dr. Shirlee Latch and we both agree that conservative care would be his best option.  I will see him back as needed.

## 2023-10-23 NOTE — Patient Instructions (Signed)
Medication Instructions:  Your physician recommends that you continue on your current medications as directed. Please refer to the Current Medication list given to you today.  *If you need a refill on your cardiac medications before your next appointment, please call your pharmacy*   Follow-Up: At [facility], you and your health needs are our priority.  As part of our continuing mission to provide you with exceptional heart care, we have created designated Provider Care Teams.  These Care Teams include your primary Cardiologist (physician) and Advanced Practice Providers (APPs -  Physician Assistants and Nurse Practitioners) who all work together to provide you with the care you need, when you need it.  We recommend signing up for the patient portal called "MyChart".  Sign up information is provided on this After Visit Summary.  MyChart is used to connect with patients for Virtual Visits (Telemedicine).  Patients are able to view lab/test results, encounter notes, upcoming appointments, etc.  Non-urgent messages can be sent to your provider as well.   To learn more about what you can do with MyChart, go to [email or web address].    Your next appointment:   We will see you on an as needed basis.  Provider:   Jonathan Berry, MD  

## 2023-10-27 DIAGNOSIS — E8722 Chronic metabolic acidosis: Secondary | ICD-10-CM | POA: Diagnosis not present

## 2023-10-27 DIAGNOSIS — N184 Chronic kidney disease, stage 4 (severe): Secondary | ICD-10-CM | POA: Diagnosis not present

## 2023-10-27 DIAGNOSIS — D638 Anemia in other chronic diseases classified elsewhere: Secondary | ICD-10-CM | POA: Diagnosis not present

## 2023-10-27 DIAGNOSIS — E875 Hyperkalemia: Secondary | ICD-10-CM | POA: Diagnosis not present

## 2023-10-27 DIAGNOSIS — E1122 Type 2 diabetes mellitus with diabetic chronic kidney disease: Secondary | ICD-10-CM | POA: Diagnosis not present

## 2023-10-27 DIAGNOSIS — N189 Chronic kidney disease, unspecified: Secondary | ICD-10-CM | POA: Diagnosis not present

## 2023-10-27 DIAGNOSIS — I5032 Chronic diastolic (congestive) heart failure: Secondary | ICD-10-CM | POA: Diagnosis not present

## 2023-10-27 DIAGNOSIS — I129 Hypertensive chronic kidney disease with stage 1 through stage 4 chronic kidney disease, or unspecified chronic kidney disease: Secondary | ICD-10-CM | POA: Diagnosis not present

## 2023-10-29 ENCOUNTER — Other Ambulatory Visit: Payer: Self-pay | Admitting: Cardiology

## 2023-11-10 DIAGNOSIS — I5022 Chronic systolic (congestive) heart failure: Secondary | ICD-10-CM | POA: Diagnosis not present

## 2023-11-10 DIAGNOSIS — Z23 Encounter for immunization: Secondary | ICD-10-CM | POA: Diagnosis not present

## 2023-11-10 DIAGNOSIS — I10 Essential (primary) hypertension: Secondary | ICD-10-CM | POA: Diagnosis not present

## 2023-11-10 DIAGNOSIS — Z299 Encounter for prophylactic measures, unspecified: Secondary | ICD-10-CM | POA: Diagnosis not present

## 2023-11-12 DIAGNOSIS — N2581 Secondary hyperparathyroidism of renal origin: Secondary | ICD-10-CM | POA: Diagnosis not present

## 2023-11-12 DIAGNOSIS — N184 Chronic kidney disease, stage 4 (severe): Secondary | ICD-10-CM | POA: Diagnosis not present

## 2023-11-12 DIAGNOSIS — R809 Proteinuria, unspecified: Secondary | ICD-10-CM | POA: Diagnosis not present

## 2023-11-12 DIAGNOSIS — E559 Vitamin D deficiency, unspecified: Secondary | ICD-10-CM | POA: Diagnosis not present

## 2023-12-07 ENCOUNTER — Ambulatory Visit (HOSPITAL_BASED_OUTPATIENT_CLINIC_OR_DEPARTMENT_OTHER)
Admission: RE | Admit: 2023-12-07 | Discharge: 2023-12-07 | Disposition: A | Discharge status: Home / Self Care | Payer: PPO | Source: Ambulatory Visit | Attending: Family Medicine | Admitting: Family Medicine

## 2023-12-07 ENCOUNTER — Encounter (HOSPITAL_COMMUNITY): Payer: Self-pay

## 2023-12-07 ENCOUNTER — Telehealth (HOSPITAL_COMMUNITY): Payer: Self-pay

## 2023-12-07 ENCOUNTER — Ambulatory Visit (HOSPITAL_COMMUNITY)
Admission: RE | Admit: 2023-12-07 | Discharge: 2023-12-07 | Disposition: A | Discharge status: Home / Self Care | Payer: PPO | Source: Ambulatory Visit | Attending: Internal Medicine | Admitting: Internal Medicine

## 2023-12-07 VITALS — BP 118/62 | HR 82 | Wt 184.4 lb

## 2023-12-07 DIAGNOSIS — Z79899 Other long term (current) drug therapy: Secondary | ICD-10-CM | POA: Diagnosis not present

## 2023-12-07 DIAGNOSIS — I5042 Chronic combined systolic (congestive) and diastolic (congestive) heart failure: Secondary | ICD-10-CM | POA: Insufficient documentation

## 2023-12-07 DIAGNOSIS — N183 Chronic kidney disease, stage 3 unspecified: Secondary | ICD-10-CM

## 2023-12-07 DIAGNOSIS — I739 Peripheral vascular disease, unspecified: Secondary | ICD-10-CM | POA: Diagnosis not present

## 2023-12-07 DIAGNOSIS — I5022 Chronic systolic (congestive) heart failure: Secondary | ICD-10-CM

## 2023-12-07 DIAGNOSIS — Z87891 Personal history of nicotine dependence: Secondary | ICD-10-CM | POA: Diagnosis not present

## 2023-12-07 DIAGNOSIS — E785 Hyperlipidemia, unspecified: Secondary | ICD-10-CM | POA: Diagnosis not present

## 2023-12-07 DIAGNOSIS — I130 Hypertensive heart and chronic kidney disease with heart failure and stage 1 through stage 4 chronic kidney disease, or unspecified chronic kidney disease: Secondary | ICD-10-CM | POA: Diagnosis not present

## 2023-12-07 DIAGNOSIS — E782 Mixed hyperlipidemia: Secondary | ICD-10-CM

## 2023-12-07 DIAGNOSIS — Z794 Long term (current) use of insulin: Secondary | ICD-10-CM | POA: Insufficient documentation

## 2023-12-07 DIAGNOSIS — E1122 Type 2 diabetes mellitus with diabetic chronic kidney disease: Secondary | ICD-10-CM | POA: Insufficient documentation

## 2023-12-07 DIAGNOSIS — I10 Essential (primary) hypertension: Secondary | ICD-10-CM

## 2023-12-07 DIAGNOSIS — I2510 Atherosclerotic heart disease of native coronary artery without angina pectoris: Secondary | ICD-10-CM | POA: Diagnosis not present

## 2023-12-07 DIAGNOSIS — Z7984 Long term (current) use of oral hypoglycemic drugs: Secondary | ICD-10-CM | POA: Insufficient documentation

## 2023-12-07 DIAGNOSIS — M79606 Pain in leg, unspecified: Secondary | ICD-10-CM | POA: Insufficient documentation

## 2023-12-07 LAB — BASIC METABOLIC PANEL
Anion gap: 6 (ref 5–15)
BUN: 40 mg/dL — ABNORMAL HIGH (ref 8–23)
CO2: 21 mmol/L — ABNORMAL LOW (ref 22–32)
Calcium: 9.2 mg/dL (ref 8.9–10.3)
Chloride: 114 mmol/L — ABNORMAL HIGH (ref 98–111)
Creatinine, Ser: 3.02 mg/dL — ABNORMAL HIGH (ref 0.61–1.24)
GFR, Estimated: 22 mL/min — ABNORMAL LOW (ref >60)
Glucose, Bld: 65 mg/dL — ABNORMAL LOW (ref 70–99)
Potassium: 5.5 mmol/L — ABNORMAL HIGH (ref 3.5–5.1)
Sodium: 141 mmol/L (ref 135–145)

## 2023-12-07 LAB — LIPID PANEL
Cholesterol: 101 mg/dL (ref 0–200)
HDL: 23 mg/dL — ABNORMAL LOW (ref >40)
LDL Cholesterol: 59 mg/dL (ref 0–99)
Total CHOL/HDL Ratio: 4.4 {ratio}
Triglycerides: 94 mg/dL (ref <150)
VLDL: 19 mg/dL (ref 0–40)

## 2023-12-07 MED ORDER — FUROSEMIDE 20 MG PO TABS: 20.0000 mg | ORAL_TABLET | ORAL | 6 refills | Status: DC | PRN

## 2023-12-07 MED ORDER — LOKELMA 5 G PO PACK
PACK | ORAL | 0 refills | Status: AC
Start: 2023-12-07 — End: ?

## 2023-12-07 NOTE — Progress Notes (Signed)
Patient ID: Derek Mckenzie, male   DOB: [birth year], 67 y.o.   MRN: [id number] PCP: Dr. Sherril Croon HF Cardiology: Dr. Shirlee Latch  67 y.o. with history of HTN, DM, PAD, CKD and systolic CHF presents for CHF clinic followup.  He has had a long history of difficult-to-control HTN.  Medications were adjusted and repeat echo in 12/17 showed improvement in EF to 60-65%.  He has known PAD, significant on 12/17 peripheral arterial dopplers.  He saw Dr. Allyson Sabal for evaluation and conservative treatment for now was recommended.  Echo in 12/18 showed EF 60-65%, moderate LVH.  Echo in 11/20 with EF 60-65%, moderate LVH, normal RV.   Echo in 5/22 showed EF 60-65% with moderate LVH and normal RV.   Follow up 11/23, NYHA I-II and volume stable. Peripheral arterial dopplers arranged showing moderate R and L PAD.  Today he returns for HF follow up. Overall feeling fine. Main complaint is LLE claudication-type pain, has to stop and rest when walking until pain subsides. He is not SOB walking on flat ground, mainly limited by LLE leg pain. Denies palpitations, CP, dizziness, edema, or PND/Orthopnea. Appetite ok. No fever or chills. Weight at home 182 pounds. Taking all medications.   Echo today 12/07/23, EF appears normal, 60%, on my read, awaiting for official MD interpretation.  ECG (personally reviewed): NSR 79 bpm  Labs (11/16): LDL 109, HDL 26, K 4.1, creatinine 3.24, BNP 197, TSH normal Labs (3/17): K 4.2, creatinine 1.73 Labs (12/17): K 5.4, creatinine 1.5, TGs 448, unable to calculate LDL, BNP 51, hgb 12.4 Labs (1/18): K 5.1, creatinine 1.62 Labs (7/18): LDL 84, HDL 21, TGs 215 Labs (8/18): K 5.1, creatinine 1.84 Labs (10/18): LDL 64, HDL 22, LFTs normal Labs (40/10): K 4.8, creatinine 1.85 Labs (1/20): K 5.1, creatinine 1.82 Labs (10/21): LDL 82, HDL 27, K 4.1, creatinine 2.72, LFTs normal Labs (2/22): LDL 53, TGs 103 Labs (3/22): K 4.3, creatinine 1.98 Labs (12/22): HDL 57, HDL 56, K 5, creatinine 2.22 Labs  (11/23): LDL 62 Labs (2/24): K 4.6, creatinine 2.53 Labs (6/24): K 4.8, creatinine 2.67  PMH:  1. Type II diabetes. 2. HTN: x years, poorly controlled. 11/16 renal artery dopplers with no significant stenosis.  3. Deafness 4. PAD: 2012 peripheral arterial dopplers with right distal SFA stenosis and left mid SFA stenosis.  - Peripheral arterial dopplers (12/17): Probable right iliac obstruction and distal left SFA obstruction.  - ABIs (12/18): 0.61 right, 0.9 left (improved).  - ABIs (1/20): 0.62 right, 0.85 left - ABIs (12/21): moderate right PAD, no changes.  - ABIs (11/23): moderate right and left PAD, referred to Dr. Allyson Sabal. - Peripheral arterial dopplers (10/24) showed progression of right mid SFA, 75-99% stenosis in left common femoral artery, 50-74% stenosis in superficial femoral artery - ABIs (10/24): moderate right and left PAD 5. Sleep study negative for OSA 6. Chronic systolic CHF: Echo (11/16) with EF 30-35%, moderate LVH.  Possible hypertensive cardiomyopathy.  - Echo (12/17): EF 60-65%.  - Echo (12/18): EF 60-65%, moderate LVH.  - Echo (11/20): EF 60-65%, moderate LVH, normal RV.  - Echo (5/22): EF 60-65% with moderate LVH and normal RV 7. CKD: Stage 3.  8. Hyperlipidemia  FH: No cardiac disease that he knows of.  +HTN.   Social History   Socioeconomic History   Marital status: Single    Spouse name: Not on file   Number of children: Not on file   Years of education: Not on file   Highest education level:  Not on file  Occupational History   Not on file  Tobacco Use   Smoking status: Former    Current packs/day: 0.50    Average packs/day: 0.5 packs/day for 35.0 years (17.5 ttl pk-yrs)    Types: Cigarettes   Smokeless tobacco: Never   Tobacco comments:    "quit smoking in ~ 2007"  Vaping Use   Vaping status: Never Used  Substance and Sexual Activity   Alcohol use: No   Drug use: No   Sexual activity: Never  Other Topics Concern   Not on file  Social  History Narrative   IN A LONG TERM PLATONIC RELATIONSHIP. BEEN GOOD FRIENDS FOR YEARS AND TAKING CARE OF EACH OTHER. SEES KIDS VERY RARELY. 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 HEARING AID DID NOT WORK.   Social Determinants of Health   Financial Resource Strain: Not on file  Food Insecurity: Not on file  Transportation Needs: Not on file  Physical Activity: Not on file  Stress: Not on file  Social Connections: Not on file  Intimate Partner Violence: Not on file   ROS: All systems reviewed and negative except as per HPI.   Current Outpatient Medications  Medication Sig Dispense Refill   Alpha-Lipoic Acid 200 MG CAPS Take 200 mg by mouth daily.      amLODipine (NORVASC) 10 MG tablet Take 1 tablet (10 mg total) by mouth daily. (Patient taking differently: Take 5 mg by mouth daily.) 90 tablet 3   carvedilol (COREG) 25 MG tablet Take 1 tablet (25 mg total) by mouth 2 (two) times daily with a meal. 180 tablet 3   empagliflozin (JARDIANCE) 10 MG TABS tablet TAKE 1 TABLET BY MOUTH ONCE DAILY BEFORE BREAKFAST 30 tablet 11   ezetimibe (ZETIA) 10 MG tablet Take 1 tablet (10 mg total) by mouth daily. 90 tablet 3   fenofibrate (TRICOR) 145 MG tablet Take 1 tablet (145 mg total) by mouth daily. 90 tablet 3   folic acid (FOLVITE) 400 MCG tablet Take 400 mcg by mouth daily.      gabapentin (NEURONTIN) 100 MG capsule Take 100 mg by mouth daily.     hydrALAZINE (APRESOLINE) 100 MG tablet take 1 tablet (100 milligram total) by mouth 3 (three) times daily. needs follow up appointment for more refills (Patient taking differently: Take 1.5 tablets by mouth 3 (three) times daily.) 180 tablet 1   insulin NPH-regular Human (NOVOLIN 70/30 RELION) (70-30) 100 UNIT/ML injection Inject 14 Units into the skin 2 (two) times daily with a meal. 30 mL 3   Insulin Syringe-Needle U-100 31G X 15/64" 0.3 ML MISC 1 Device by Does not apply route in the morning and at bedtime. 200 each 3   isosorbide  mononitrate (IMDUR) 60 MG 24 hr tablet Take 1 tablet (60 mg total) by mouth in the morning and at bedtime. 180 tablet 3   latanoprost (XALATAN) 0.005 % ophthalmic solution Place 1 drop into both eyes at bedtime.     sacubitril-valsartan (ENTRESTO) 49-51 MG Take 1 tablet by mouth 2 (two) times daily. 180 tablet 3   spironolactone (ALDACTONE) 25 MG tablet Take 0.5 tablets (12.5 mg total) by mouth daily. 45 tablet 3   vitamin B-12 (CYANOCOBALAMIN) 1000 MCG tablet Take 1,000 mcg by mouth daily.      No current facility-administered medications for this encounter.   Wt Readings from Last 3 Encounters:  12/07/23 83.6 kg (184 lb 6.4 oz)  10/23/23 84.1 kg (185  lb 6.4 oz)  08/19/23 83.1 kg (183 lb 3.2 oz)    BP 118/62   Pulse 82   Wt 83.6 kg (184 lb 6.4 oz)   SpO2 99%   BMI 30.69 kg/m  Physical Exam General:  NAD. No resp difficulty, walked into clinic HEENT: + HOH Neck: Supple. No JVD, thick neck. Carotids 2+ bilat; no bruits. No lymphadenopathy or thryomegaly appreciated. Cor: PMI nondisplaced. Regular rate & rhythm. No rubs, gallops or murmurs. Lungs: Clear Abdomen: Soft, obese, nontender, nondistended. No hepatosplenomegaly. No bruits or masses. Good bowel sounds. Extremities: No cyanosis, clubbing, rash, 1+ pedal edema Neuro: Alert & oriented x 3, cranial nerves grossly intact. Moves all 4 extremities w/o difficulty. Affect pleasant.  Assessment/Plan: 1. Chronic systolic => diastolic CHF: Echo 11/16 with EF 30-35%.  Given improvement in EF to 60-65% with BP control, suspect this was a hypertensive cardiomyopathy. Has moderate LVH on echo.  NYHA class I-II, but he is limited by leg weakness/fatigue that is likely a form of claudication.   - Will give PRN Lasix 20 mg with ankle edema - Continue Coreg 25 mg bid. - Continue hydralazine 100 mg tid + Imdur 60 mg daily. - Continue Jardiance 10 mg daily. No GU symptoms. - Continue Entresto 49/51 mg bid. BMET today. - Continue spironolactone  12.5 mg daily. - Echo today 12/07/23, EF appears stable at 60%. Official MD results pending. 2. HTN: Renal arterial dopplers in 11/16 did not show evidence for renal artery stenosis. Moderate LVH on 5/22 echo.  - BP controlled on current medications. 3. PAD: ABIs (12/21) abnormal in 12/21 but stable. Repeat peripheral arterial dopplers (1/24) showed moderate right and left PAD. Would need high threshold for intervention with significant CKD.  He continues with leg pain and heaviness, no rest pain or pedal ulcers. - He is followed by Dr. Allyson Sabal and electing for conservative medical management given DM and CKD. We discussed this again today. - Continue ASA 81 and statin.   4. Hyperlipidemia:  Continue fenofibrate and ezetimibe. Check lipids today. 5. CKD: Stage 3.  BMET today. - Continue Jardiance.   Follow up in 6 months with Dr. Shirlee Latch.  [facility] FNP-BC 12/07/2023

## 2023-12-07 NOTE — Progress Notes (Signed)
  Echocardiogram 2D Echocardiogram has been performed.  Delcie Roch 12/07/2023, 11:37 AM

## 2023-12-07 NOTE — Patient Instructions (Addendum)
Thank you for coming in today  If you had labs drawn today, any labs that are abnormal the clinic will call you No news is good news  EKG was done today  Medications: Start Lasix 20 mg 1 tablet daily as needed For weight gain of 3 lbs in 24 hours or 5 lbs in a week   Follow up appointments:  Your physician recommends that you schedule a follow-up appointment in:  6 months With Dr. Earlean Shawl will receive a reminder letter in the mail a few months in advance. If you don't receive a letter, please call our office to schedule the follow-up appointment.    Do the following things EVERYDAY: Weigh yourself in the morning before breakfast. Write it down and keep it in a log. Take your medicines as prescribed Eat low salt foods--Limit salt (sodium) to 2000 mg per day.  Stay as active as you can everyday Limit all fluids for the day to less than 2 liters   At the [facility], you and your health needs are our priority. As part of our continuing mission to provide you with exceptional heart care, we have created designated Provider Care Teams. These Care Teams include your primary Cardiologist (physician) and Advanced Practice Providers (APPs- Physician Assistants and Nurse Practitioners) who all work together to provide you with the care you need, when you need it.   You may see any of the following providers on your designated Care Team at your next follow up: Dr Arvilla Meres Dr Marca Ancona Dr. Marcos Eke, NP Robbie Lis, [place] [facility] [place], [place] Brynda Peon, NP Karle Plumber, PharmD   Please be sure to bring in all your medications bottles to every appointment.    Thank you for choosing [facility]  If you have any questions or concerns before your next appointment please send [place] a message through [place] or call our office at [phone].    TO LEAVE A MESSAGE FOR THE NURSE SELECT  OPTION 2, PLEASE LEAVE A MESSAGE INCLUDING: YOUR NAME DATE OF BIRTH CALL BACK NUMBER REASON FOR CALL**this is important as we prioritize the call backs  YOU WILL RECEIVE A CALL BACK THE SAME DAY AS LONG AS YOU CALL BEFORE 4:00 PM

## 2023-12-07 NOTE — Telephone Encounter (Signed)
Patients med list has been updated in pt's chart. In addition labs placed appointment scheduled. Patient also requested a MyChart message be sent as well. Pt aware, agreeable, and verbalized understanding.

## 2023-12-07 NOTE — Telephone Encounter (Signed)
-----   Message from [place] sent at 12/07/2023  1:32 PM EST ----- K is too high & renal function elevated.   Stop spiro. Stop and KCL supplements (including any multivitamin)  Hold Entresto x 3 days. Take Lokelma 5 g x 1 today (1/2 packet).  Repeat BMET on Thurs or Fri this week

## 2023-12-08 LAB — ECHOCARDIOGRAM COMPLETE
Area-P 1/2: 3.85 cm2
S' Lateral: 2.2 cm

## 2023-12-10 ENCOUNTER — Telehealth (HOSPITAL_COMMUNITY): Payer: Self-pay

## 2023-12-10 NOTE — Telephone Encounter (Signed)
[facility] Patient Advocate Encounter  Application for Entresto faxed to [employer] on 12/10/2023. Application form attached to patient chart.  Burnell Blanks, CPhT Rx Patient Advocate Phone: [phone]

## 2023-12-11 ENCOUNTER — Ambulatory Visit: Payer: PPO | Admitting: Internal Medicine

## 2023-12-11 ENCOUNTER — Ambulatory Visit (HOSPITAL_COMMUNITY)
Admission: RE | Admit: 2023-12-11 | Discharge: 2023-12-11 | Disposition: A | Discharge status: Home / Self Care | Payer: PPO | Source: Ambulatory Visit | Attending: Cardiology | Admitting: Cardiology

## 2023-12-11 ENCOUNTER — Telehealth (HOSPITAL_COMMUNITY): Payer: Self-pay

## 2023-12-11 DIAGNOSIS — I5022 Chronic systolic (congestive) heart failure: Secondary | ICD-10-CM

## 2023-12-11 LAB — BASIC METABOLIC PANEL
Anion gap: 5 (ref 5–15)
BUN: 35 mg/dL — ABNORMAL HIGH (ref 8–23)
CO2: 22 mmol/L (ref 22–32)
Calcium: 8.9 mg/dL (ref 8.9–10.3)
Chloride: 112 mmol/L — ABNORMAL HIGH (ref 98–111)
Creatinine, Ser: 3.03 mg/dL — ABNORMAL HIGH (ref 0.61–1.24)
GFR, Estimated: 22 mL/min — ABNORMAL LOW (ref >60)
Glucose, Bld: 158 mg/dL — ABNORMAL HIGH (ref 70–99)
Potassium: 5.6 mmol/L — ABNORMAL HIGH (ref 3.5–5.1)
Sodium: 139 mmol/L (ref 135–145)

## 2023-12-11 NOTE — Telephone Encounter (Signed)
-----   Message from Jacklynn Ganong sent at 12/11/2023  3:14 PM EST ----- K remains elevated.   Please call and make sure he stopped his spiro and KCL, also make sure he held his Entresto x 3 days and took Lokelma dose.  If so, he needs to take a full dose of Lokelma 10 g x 1 today. Repeat BMET on Monday

## 2023-12-11 NOTE — Telephone Encounter (Signed)
Spoke with patient regarding the following results. Patient made aware and patient verbalized understanding.   Patient aware of all instructions and verbalized understanding. Patient will continue to hold medication potassium, spiro, and entresto and will take additional lokelma dose today.   Repeat labs ordered and scheduled for Monday 12/16- patient aware of appointment time and date.

## 2023-12-14 ENCOUNTER — Telehealth (HOSPITAL_COMMUNITY): Payer: Self-pay

## 2023-12-14 ENCOUNTER — Ambulatory Visit (HOSPITAL_COMMUNITY)
Admission: RE | Admit: 2023-12-14 | Discharge: 2023-12-14 | Disposition: A | Discharge status: Home / Self Care | Payer: PPO | Source: Ambulatory Visit | Attending: Internal Medicine | Admitting: Internal Medicine

## 2023-12-14 DIAGNOSIS — I5022 Chronic systolic (congestive) heart failure: Secondary | ICD-10-CM

## 2023-12-14 LAB — BASIC METABOLIC PANEL
Anion gap: 7 (ref 5–15)
BUN: 34 mg/dL — ABNORMAL HIGH (ref 8–23)
CO2: 22 mmol/L (ref 22–32)
Calcium: 9.1 mg/dL (ref 8.9–10.3)
Chloride: 112 mmol/L — ABNORMAL HIGH (ref 98–111)
Creatinine, Ser: 2.9 mg/dL — ABNORMAL HIGH (ref 0.61–1.24)
GFR, Estimated: 23 mL/min — ABNORMAL LOW (ref >60)
Glucose, Bld: 117 mg/dL — ABNORMAL HIGH (ref 70–99)
Potassium: 5.5 mmol/L — ABNORMAL HIGH (ref 3.5–5.1)
Sodium: 141 mmol/L (ref 135–145)

## 2023-12-14 NOTE — Telephone Encounter (Addendum)
Patient stated he is off his Entresto and Spirolactone  Patient stated he is going out of town tomorrow morning for [employer] and will call when he gets back after the New year  and will call to get labs repeated  ----- Message from Jacklynn Ganong sent at 12/14/2023  4:31 PM EST ----- K continues to be elevated.  Please call and make sure he has remained off Entresto, spiro, and KCL supplement or multivitamin.  Keep diet low in K-rich foods. 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

## 2023-12-17 NOTE — Telephone Encounter (Signed)
Patient was approved to receive Entresto from [employer] Effective 12/10/2023 to 12/28/2024.

## 2023-12-29 DIAGNOSIS — M79676 Pain in unspecified toe(s): Secondary | ICD-10-CM | POA: Diagnosis not present

## 2023-12-29 DIAGNOSIS — B351 Tinea unguium: Secondary | ICD-10-CM | POA: Diagnosis not present

## 2023-12-29 DIAGNOSIS — L84 Corns and callosities: Secondary | ICD-10-CM | POA: Diagnosis not present

## 2023-12-29 DIAGNOSIS — E1142 Type 2 diabetes mellitus with diabetic polyneuropathy: Secondary | ICD-10-CM | POA: Diagnosis not present

## 2024-02-12 DIAGNOSIS — D631 Anemia in chronic kidney disease: Secondary | ICD-10-CM | POA: Diagnosis not present

## 2024-02-12 DIAGNOSIS — R809 Proteinuria, unspecified: Secondary | ICD-10-CM | POA: Diagnosis not present

## 2024-02-12 DIAGNOSIS — N189 Chronic kidney disease, unspecified: Secondary | ICD-10-CM | POA: Diagnosis not present

## 2024-02-13 LAB — LAB REPORT - SCANNED
Creatinine, POC: 87.3 mg/dL
EGFR: 28

## 2024-02-16 DIAGNOSIS — H401132 Primary open-angle glaucoma, bilateral, moderate stage: Secondary | ICD-10-CM | POA: Diagnosis not present

## 2024-02-16 DIAGNOSIS — E113593 Type 2 diabetes mellitus with proliferative diabetic retinopathy without macular edema, bilateral: Secondary | ICD-10-CM | POA: Diagnosis not present

## 2024-02-19 DIAGNOSIS — R809 Proteinuria, unspecified: Secondary | ICD-10-CM | POA: Diagnosis not present

## 2024-02-19 DIAGNOSIS — I129 Hypertensive chronic kidney disease with stage 1 through stage 4 chronic kidney disease, or unspecified chronic kidney disease: Secondary | ICD-10-CM | POA: Diagnosis not present

## 2024-02-19 DIAGNOSIS — E875 Hyperkalemia: Secondary | ICD-10-CM | POA: Diagnosis not present

## 2024-02-19 DIAGNOSIS — I5022 Chronic systolic (congestive) heart failure: Secondary | ICD-10-CM | POA: Diagnosis not present

## 2024-02-25 ENCOUNTER — Ambulatory Visit (INDEPENDENT_AMBULATORY_CARE_PROVIDER_SITE_OTHER): Payer: PPO | Admitting: Internal Medicine

## 2024-02-25 ENCOUNTER — Encounter: Payer: Self-pay | Admitting: Internal Medicine

## 2024-02-25 ENCOUNTER — Telehealth: Payer: Self-pay | Admitting: Cardiovascular Disease

## 2024-02-25 VITALS — BP 120/80 | HR 75 | Ht 65.0 in | Wt 183.0 lb

## 2024-02-25 DIAGNOSIS — E1159 Type 2 diabetes mellitus with other circulatory complications: Secondary | ICD-10-CM

## 2024-02-25 DIAGNOSIS — E1142 Type 2 diabetes mellitus with diabetic polyneuropathy: Secondary | ICD-10-CM

## 2024-02-25 DIAGNOSIS — E118 Type 2 diabetes mellitus with unspecified complications: Secondary | ICD-10-CM | POA: Diagnosis not present

## 2024-02-25 DIAGNOSIS — Z794 Long term (current) use of insulin: Secondary | ICD-10-CM

## 2024-02-25 DIAGNOSIS — E1122 Type 2 diabetes mellitus with diabetic chronic kidney disease: Secondary | ICD-10-CM

## 2024-02-25 DIAGNOSIS — N184 Chronic kidney disease, stage 4 (severe): Secondary | ICD-10-CM

## 2024-02-25 LAB — POCT GLYCOSYLATED HEMOGLOBIN (HGB A1C): Hemoglobin A1C: 7.3 % — AB (ref 4.0–5.6)

## 2024-02-25 MED ORDER — INSULIN SYRINGE-NEEDLE U-100 31G X 15/64" 0.3 ML MISC: 1.0000 | Freq: Two times a day (BID) | 3 refills | Status: DC

## 2024-02-25 MED ORDER — NOVOLIN 70/30 RELION (70-30) 100 UNIT/ML ~~LOC~~ SUSP: SUBCUTANEOUS | 4 refills | Status: DC

## 2024-02-25 NOTE — Progress Notes (Signed)
 Name: Derek Mckenzie  Age/ Sex: 68 y.o., male   MRN/ DOB: [id number], 12-30-1955     PCP: Ignatius Specking, MD   Reason for Endocrinology Evaluation: Type 2 Diabetes Mellitus  Initial Endocrine Consultative Visit: 05/16/2019    PATIENT IDENTIFIER: Derek Mckenzie is a 68 y.o. male with a past medical history of HTN, T2DM, cardiomyopathy and dyslipidemia. The patient has followed with Endocrinology clinic since 05/16/2019 for consultative assistance with management of his diabetes.  DIABETIC HISTORY:  Derek Mckenzie was diagnosed with T2DM > 20 yrs ago. He does not recall prior oral glycemic agents, he has been on insulin for years.  His hemoglobin A1c has ranged from 12.2% in 2016, peaking at 13.1% in  2019   On his initial visit to our clinic, his A1c was 12.2% and he was on Novolin- N only. We started him on Novolin- Mix   He declined switching to Vials in 2022  SUBJECTIVE:   During the last visit (06/11/2023): A1c 7.2%     Today (02/25/2024): Derek Mckenzie is here for a  follow up on diabetes management.  He checks his blood sugars 2 times daily, preprandial to breakfast and supper.  The patient has has  had hypoglycemic episodes since the last clinic visit.     He had a follow-up with podiatry 12/29/2023  Patient follows with cardiology for CHF, PAD He also follows with nephrology for CKD   he denies any nausea or vomiting  He did have cough the patient that he attributes to medications The patient did have lower extremity edema which caused difficulty in walking but this has improved Denies palpitations   HOME DIABETES REGIMEN:  Novolin Mix (70/30) 14 units BID with Breakfast and supper   Jardiance 10 mg daily - per cardio   METER DOWNLOAD SUMMARY: 1/29-2/27/2025  Overall Mean FS Glucose = 159 Standard Deviation = 59  BG Ranges: Low = 92 High = 268   Hypoglycemic Events/30 Days: BG < 50 = 0 Episodes of symptomatic severe hypoglycemia =  0     DIABETIC COMPLICATIONS: Microvascular complications:  CKD IV Denies: neuropathy, retinopathy Last eye exam: Completed 04/02/2023   Macrovascular complications:  CHF, PVD Denies: CAD, CVA    HISTORY:  Past Medical History:  Past Medical History:  Diagnosis Date   CHF (congestive heart failure) (HCC)    Deafness in left ear    ED (erectile dysfunction)    Hearing loss in right ear    Hyperlipidemia    Hypertension    PAD (peripheral artery disease) (HCC)    Type II diabetes mellitus (HCC)    Past Surgical History:  Past Surgical History:  Procedure Laterality Date   CATARACT EXTRACTION W/PHACO Left 03/11/2016   Procedure: CATARACT EXTRACTION PHACO AND INTRAOCULAR LENS PLACEMENT (IOC);  Surgeon: Jethro Bolus, MD;  Location: [facility];  Service: Ophthalmology;  Laterality: Left;  CDE:2.71   CATARACT EXTRACTION W/PHACO Right 05/20/2016   Procedure: CATARACT EXTRACTION PHACO AND INTRAOCULAR LENS PLACEMENT (IOC);  Surgeon: Jethro Bolus, MD;  Location: [facility];  Service: Ophthalmology;  Laterality: Right;  CDE: 4.33   COLONOSCOPY N/A 01/20/2020   Procedure: COLONOSCOPY;  Surgeon: West Bali, MD;  Location: [facility];  Service: Endoscopy;  Laterality: N/A;  8:30am   COLONOSCOPY WITH PROPOFOL N/A 02/16/2023   Procedure: COLONOSCOPY WITH PROPOFOL;  Surgeon: Lanelle Bal, DO;  Location: [facility];  Service: Endoscopy;  Laterality: N/A;  1030am, asa 3   POLYPECTOMY  01/20/2020  Procedure: POLYPECTOMY;  Surgeon: West Bali, MD;  Location: [facility];  Service: Endoscopy;;   POLYPECTOMY  02/16/2023   Procedure: POLYPECTOMY;  Surgeon: Lanelle Bal, DO;  Location: [facility];  Service: Endoscopy;;   RETINAL LASER PROCEDURE     VASECTOMY     YAG LASER APPLICATION Right 02/24/2017   Procedure: YAG LASER APPLICATION;  Surgeon: Jethro Bolus, MD;  Location: [facility];  Service: Ophthalmology;  Laterality: Right;   Social History:  reports that he has quit smoking.  His smoking use included cigarettes. He has a 17.5 pack-year smoking history. He has never used smokeless tobacco. He reports that he does not drink alcohol and does not use drugs. Family History:  Family History  Problem Relation Age of Onset   Hypertension Mother    Stroke Mother    Heart disease Father    Other Neg Hx        gynecomastia   Colon cancer Neg Hx    Colon polyps Neg Hx      HOME MEDICATIONS: Allergies as of 02/25/2024       Reactions   Atenolol Shortness Of Breath   Amlodipine Besy-benazepril Hcl Other (See Comments)   Headaches        Medication List        Accurate as of February 25, 2024 10:19 AM. If you have any questions, ask your nurse or doctor.          Alpha-Lipoic Acid 200 MG Caps Take 200 mg by mouth daily.   amLODipine 10 MG tablet Commonly known as: NORVASC Take 1 tablet (10 mg total) by mouth daily. What changed: how much to take   atorvastatin 80 MG tablet Commonly known as: LIPITOR Take 80 mg by mouth daily.   carvedilol 25 MG tablet Commonly known as: COREG Take 1 tablet (25 mg total) by mouth 2 (two) times daily with a meal.   clobetasol cream 0.05 % Commonly known as: TEMOVATE SMARTSIG:1 Topical Daily   cyanocobalamin 1000 MCG tablet Commonly known as: VITAMIN B12 Take 1,000 mcg by mouth daily.   ezetimibe 10 MG tablet Commonly known as: ZETIA Take 1 tablet (10 mg total) by mouth daily.   fenofibrate 145 MG tablet Commonly known as: TRICOR Take 1 tablet (145 mg total) by mouth daily.   folic acid 400 MCG tablet Commonly known as: FOLVITE Take 400 mcg by mouth daily.   furosemide 20 MG tablet Commonly known as: LASIX Take 1 tablet (20 mg total) by mouth as needed. For weight gain of 3 lbs in 24 hours or 5 lbs in a week   gabapentin 100 MG capsule Commonly known as: NEURONTIN Take 100 mg by mouth daily.   hydrALAZINE 100 MG tablet Commonly known as: APRESOLINE take 1 tablet (100 milligram total) by mouth  3 (three) times daily. needs follow up appointment for more refills What changed: See the new instructions.   Insulin Syringe-Needle U-100 31G X 15/64" 0.3 ML Misc 1 Device by Does not apply route in the morning and at bedtime.   isosorbide mononitrate 60 MG 24 hr tablet Commonly known as: IMDUR Take 1 tablet (60 mg total) by mouth in the morning and at bedtime.   Jardiance 10 MG Tabs tablet Generic drug: empagliflozin TAKE 1 TABLET BY MOUTH ONCE DAILY BEFORE BREAKFAST   latanoprost 0.005 % ophthalmic solution Commonly known as: XALATAN Place 1 drop into both eyes at bedtime.   Lokelma 5 g packet Generic drug: sodium  zirconium cyclosilicate Take 1/2 packet by mouth today only as directed by the heart failure clinic   NIFEdipine 60 MG 24 hr tablet Commonly known as: PROCARDIA XL/NIFEDICAL XL Take 60 mg by mouth 2 (two) times daily.   NovoLIN 70/30 ReliOn (70-30) 100 UNIT/ML injection Generic drug: insulin NPH-regular Human Inject 16 Units into the skin daily with breakfast AND 14 Units daily with supper. What changed: See the new instructions. Changed by: Johnney Ou Nyiah Pianka   OneTouch Ultra Test test strip Generic drug: glucose blood USE 1 STRIP TO CHECK GLUCOSE THREE TIMES DAILY   spironolactone 25 MG tablet Commonly known as: ALDACTONE   tamsulosin 0.4 MG Caps capsule Commonly known as: FLOMAX Take by mouth.   Vitamin D (Ergocalciferol) 1.25 MG (50000 UNIT) Caps capsule Commonly known as: DRISDOL Take 50,000 Units by mouth once a week.         OBJECTIVE:   Vital Signs: BP 120/80 (BP Location: Left Arm, Patient Position: Sitting, Cuff Size: Normal)   Pulse 75   Ht 5\' 5"  (1.651 m)   Wt 183 lb (83 kg)   SpO2 96%   BMI 30.45 kg/m   Wt Readings from Last 3 Encounters:  02/25/24 183 lb (83 kg)  12/07/23 184 lb 6.4 oz (83.6 kg)  10/23/23 185 lb 6.4 oz (84.1 kg)     Exam: General: Pt appears well and is in NAD  Lungs: Clear with good BS bilat   Heart:  RRR   Extremities: Trace pretibial edema.   Neuro: MS is good with appropriate affect, pt is alert and Ox3     DM foot exam: 12/29/2023 per podiatry          DATA REVIEWED:  Lab Results  Component Value Date   HGBA1C 7.3 (A) 02/25/2024   HGBA1C 7.2 (A) 06/11/2023   HGBA1C 6.1 (A) 12/09/2022    Latest Reference Range & Units 12/14/23 12:40  Sodium 135 - 145 mmol/L 141  Potassium 3.5 - 5.1 mmol/L 5.5 (H)  Chloride 98 - 111 mmol/L 112 (H)  CO2 22 - 32 mmol/L 22  Glucose 70 - 99 mg/dL 161 (H)  BUN 8 - 23 mg/dL 34 (H)  Creatinine 0.96 - 1.24 mg/dL 0.45 (H)  Calcium 8.9 - 10.3 mg/dL 9.1  Anion gap 5 - 15  7  GFR, Estimated >60 mL/min 23 (L)    Latest Reference Range & Units 12/07/23 12:06  Total CHOL/HDL Ratio RATIO 4.4  Cholesterol 0 - 200 mg/dL 409  HDL Cholesterol >81 mg/dL 23 (L)  LDL (calc) 0 - 99 mg/dL 59  Triglycerides <191 mg/dL 94  VLDL 0 - 40 mg/dL 19  (L): Data is abnormally low   ASSESSMENT / PLAN / RECOMMENDATIONS:   1) Type 2 Diabetes Mellitus, Sub-OPtimally controlled, With CKD IV complications and PVD - Most recent A1c of 7.3 %. Goal A1c < 7.0 %.   -A1c has trended up, - Dexcom has been cost prohibitive - Freestyle libre was inaccurate with 40-60 points lower then finger stick -Historically unable to add glycemic agents due to concerns about the cost -He is on Jardiance through cardiology -The patient has been noted with hyperglycemia before suppertime, he admits to dietary indiscretions.  I will increase his insulin dose with breakfast only and continue his current dose with supper   MEDICATIONS: - Change   Novolin Mix (70/30) 16 units with Breakfast and 14 units with supper    EDUCATION / INSTRUCTIONS: BG monitoring instructions: Patient is instructed to  check his blood sugars 2 times a day, before breakfast and supper. Call [facility] [facility] if: BG persistently < 70 I reviewed the Rule of 15 for the treatment of hypoglycemia in  detail with the patient. Literature supplied.    2) Dyslipidemia:  -Per cardiology  F/U  6 months    I spent 25 minutes preparing to see the patient by review of recent labs, imaging and procedures, obtaining and reviewing separately obtained history, communicating with the patient, ordering medications, tests or procedures, and documenting clinical information in the EHR including the differential Dx, treatment, and any further evaluation and other management    Signed electronically by: Lyndle Herrlich, MD  [facility] Endocrinology  [facility] [address] [place] [place], [place] [address] Phone: [phone] FAX: [phone]   CC: Ignatius Specking, MD [address] [place] [address] Phone: [phone]  Fax: [phone]  Return to Endocrinology clinic as below: No future appointments.

## 2024-02-25 NOTE — Patient Instructions (Addendum)
-   Change Novolin -Mix to 16 units with Breakfast and 14  units with Supper     - HOW TO TREAT LOW BLOOD SUGARS (Blood sugar LESS THAN 70 MG/DL) Please follow the RULE OF 15 for the treatment of hypoglycemia treatment (when your (blood sugars are less than 70 mg/dL)   STEP 1: Take 15 grams of carbohydrates when your blood sugar is low, which includes:  3-4 GLUCOSE TABS  OR 3-4 OZ OF JUICE OR REGULAR SODA OR ONE TUBE OF GLUCOSE GEL    STEP 2: RECHECK blood sugar in 15 MINUTES STEP 3: If your blood sugar is still low at the 15 minute recheck --> then, go back to STEP 1 and treat AGAIN with another 15 grams of carbohydrates.

## 2024-02-25 NOTE — Telephone Encounter (Signed)
 Patient walked in and, said it is getting harder for him to commute to [place] to see Dr. Allyson Sabal. He is requesting to see a provider here in [place].

## 2024-02-29 ENCOUNTER — Other Ambulatory Visit: Payer: Self-pay | Admitting: Cardiology

## 2024-02-29 ENCOUNTER — Encounter: Payer: Self-pay | Admitting: Hematology and Oncology

## 2024-03-18 DIAGNOSIS — F322 Major depressive disorder, single episode, severe without psychotic features: Secondary | ICD-10-CM | POA: Diagnosis not present

## 2024-03-18 DIAGNOSIS — I10 Essential (primary) hypertension: Secondary | ICD-10-CM | POA: Diagnosis not present

## 2024-03-18 DIAGNOSIS — K5900 Constipation, unspecified: Secondary | ICD-10-CM | POA: Diagnosis not present

## 2024-03-18 DIAGNOSIS — Z299 Encounter for prophylactic measures, unspecified: Secondary | ICD-10-CM | POA: Diagnosis not present

## 2024-03-18 DIAGNOSIS — I5022 Chronic systolic (congestive) heart failure: Secondary | ICD-10-CM | POA: Diagnosis not present

## 2024-03-18 DIAGNOSIS — N184 Chronic kidney disease, stage 4 (severe): Secondary | ICD-10-CM | POA: Diagnosis not present

## 2024-03-22 DIAGNOSIS — B351 Tinea unguium: Secondary | ICD-10-CM | POA: Diagnosis not present

## 2024-03-22 DIAGNOSIS — M79676 Pain in unspecified toe(s): Secondary | ICD-10-CM | POA: Diagnosis not present

## 2024-03-22 DIAGNOSIS — L84 Corns and callosities: Secondary | ICD-10-CM | POA: Diagnosis not present

## 2024-03-22 DIAGNOSIS — E1142 Type 2 diabetes mellitus with diabetic polyneuropathy: Secondary | ICD-10-CM | POA: Diagnosis not present

## 2024-04-07 DIAGNOSIS — L2089 Other atopic dermatitis: Secondary | ICD-10-CM | POA: Diagnosis not present

## 2024-04-07 DIAGNOSIS — L810 Postinflammatory hyperpigmentation: Secondary | ICD-10-CM | POA: Diagnosis not present

## 2024-05-09 DIAGNOSIS — N189 Chronic kidney disease, unspecified: Secondary | ICD-10-CM | POA: Diagnosis not present

## 2024-05-09 DIAGNOSIS — R809 Proteinuria, unspecified: Secondary | ICD-10-CM | POA: Diagnosis not present

## 2024-05-09 DIAGNOSIS — D631 Anemia in chronic kidney disease: Secondary | ICD-10-CM | POA: Diagnosis not present

## 2024-05-09 DIAGNOSIS — E211 Secondary hyperparathyroidism, not elsewhere classified: Secondary | ICD-10-CM | POA: Diagnosis not present

## 2024-05-09 DIAGNOSIS — D649 Anemia, unspecified: Secondary | ICD-10-CM | POA: Diagnosis not present

## 2024-05-24 DIAGNOSIS — I5022 Chronic systolic (congestive) heart failure: Secondary | ICD-10-CM | POA: Diagnosis not present

## 2024-05-24 DIAGNOSIS — N1832 Chronic kidney disease, stage 3b: Secondary | ICD-10-CM | POA: Diagnosis not present

## 2024-05-24 DIAGNOSIS — E1122 Type 2 diabetes mellitus with diabetic chronic kidney disease: Secondary | ICD-10-CM | POA: Diagnosis not present

## 2024-05-24 DIAGNOSIS — R809 Proteinuria, unspecified: Secondary | ICD-10-CM | POA: Diagnosis not present

## 2024-05-27 DIAGNOSIS — Z1331 Encounter for screening for depression: Secondary | ICD-10-CM | POA: Diagnosis not present

## 2024-05-27 DIAGNOSIS — E1165 Type 2 diabetes mellitus with hyperglycemia: Secondary | ICD-10-CM | POA: Diagnosis not present

## 2024-05-27 DIAGNOSIS — Z794 Long term (current) use of insulin: Secondary | ICD-10-CM | POA: Diagnosis not present

## 2024-05-27 DIAGNOSIS — I5022 Chronic systolic (congestive) heart failure: Secondary | ICD-10-CM | POA: Diagnosis not present

## 2024-05-27 DIAGNOSIS — I10 Essential (primary) hypertension: Secondary | ICD-10-CM | POA: Diagnosis not present

## 2024-05-27 DIAGNOSIS — Z0000 Encounter for general adult medical examination without abnormal findings: Secondary | ICD-10-CM | POA: Diagnosis not present

## 2024-05-27 DIAGNOSIS — Z299 Encounter for prophylactic measures, unspecified: Secondary | ICD-10-CM | POA: Diagnosis not present

## 2024-05-27 DIAGNOSIS — F322 Major depressive disorder, single episode, severe without psychotic features: Secondary | ICD-10-CM | POA: Diagnosis not present

## 2024-05-27 DIAGNOSIS — Z1339 Encounter for screening examination for other mental health and behavioral disorders: Secondary | ICD-10-CM | POA: Diagnosis not present

## 2024-05-27 DIAGNOSIS — Z7189 Other specified counseling: Secondary | ICD-10-CM | POA: Diagnosis not present

## 2024-06-08 ENCOUNTER — Telehealth: Payer: Self-pay

## 2024-06-08 NOTE — Progress Notes (Signed)
   06/08/2024  Patient ID: Derek Mckenzie, male   DOB: 1956/06/08, 69 y.o.   MRN: [id number]  Contacted patient regarding medication adherence from a quality report for [facility]. The patient is listed at-risk for MAD in 2025.    Left patient a HIPAA voicemail to return my call at their convenience.   Livia Riffle, PharmD Clinical Pharmacist  [phone]

## 2024-06-10 NOTE — Progress Notes (Signed)
   06/10/2024  Patient ID: Derek Mckenzie, male   DOB: 09-Oct-1956, 68 y.o.   MRN: [id number]  Contacted patient regarding medication adherence from a quality report for [facility]. The patient is listed at-risk for MAD in 2025.    Left patient a HIPAA voicemail to return my call at their convenience.   Livia Riffle, PharmD Clinical Pharmacist  [phone]

## 2024-06-14 DIAGNOSIS — M79674 Pain in right toe(s): Secondary | ICD-10-CM | POA: Diagnosis not present

## 2024-06-14 DIAGNOSIS — M79675 Pain in left toe(s): Secondary | ICD-10-CM | POA: Diagnosis not present

## 2024-06-14 DIAGNOSIS — B351 Tinea unguium: Secondary | ICD-10-CM | POA: Diagnosis not present

## 2024-06-14 DIAGNOSIS — E1142 Type 2 diabetes mellitus with diabetic polyneuropathy: Secondary | ICD-10-CM | POA: Diagnosis not present

## 2024-06-14 DIAGNOSIS — L84 Corns and callosities: Secondary | ICD-10-CM | POA: Diagnosis not present

## 2024-06-27 NOTE — Progress Notes (Signed)
   06/27/2024  Patient ID: Derek Mckenzie, male   DOB: 12/24/1956, 68 y.o.   MRN: [id number]  Contacted patient regarding medication adherence from a quality report for [facility]. The patient is listed at-risk for MAD in 2025.    Left patient a HIPAA voicemail to return my call at their convenience.   Heather Factor, PharmD Clinical Pharmacist  [phone]

## 2024-06-28 DIAGNOSIS — K5909 Other constipation: Secondary | ICD-10-CM | POA: Diagnosis not present

## 2024-06-28 DIAGNOSIS — K5900 Constipation, unspecified: Secondary | ICD-10-CM | POA: Diagnosis not present

## 2024-06-30 ENCOUNTER — Other Ambulatory Visit (HOSPITAL_COMMUNITY): Payer: Self-pay | Admitting: Cardiology

## 2024-06-30 ENCOUNTER — Other Ambulatory Visit (HOSPITAL_COMMUNITY): Payer: Self-pay | Admitting: Family Medicine

## 2024-07-05 ENCOUNTER — Telehealth (HOSPITAL_COMMUNITY): Payer: Self-pay

## 2024-07-05 NOTE — Telephone Encounter (Signed)
 error

## 2024-07-18 ENCOUNTER — Other Ambulatory Visit (HOSPITAL_COMMUNITY): Payer: Self-pay | Admitting: Family Medicine

## 2024-07-18 ENCOUNTER — Telehealth (HOSPITAL_COMMUNITY): Payer: Self-pay | Admitting: Cardiology

## 2024-07-30 ENCOUNTER — Other Ambulatory Visit: Payer: Self-pay | Admitting: Cardiology

## 2024-08-01 ENCOUNTER — Other Ambulatory Visit (HOSPITAL_COMMUNITY): Payer: Self-pay | Admitting: Cardiology

## 2024-08-04 ENCOUNTER — Telehealth (HOSPITAL_COMMUNITY): Payer: Self-pay | Admitting: Cardiology

## 2024-08-04 DIAGNOSIS — H401132 Primary open-angle glaucoma, bilateral, moderate stage: Secondary | ICD-10-CM | POA: Diagnosis not present

## 2024-08-12 NOTE — Progress Notes (Signed)
   08/12/2024  Patient ID: Derek Mckenzie, male   DOB: [birth year], 68 y.o.   MRN: [id number]  Contacted patient regarding medication adherence from a quality report for [facility]. The patient is listed at-risk for MAD in 2025.    Left patient a HIPAA voicemail to return my call at their convenience.   Heather Factor, PharmD Clinical Pharmacist  [phone]

## 2024-08-24 ENCOUNTER — Ambulatory Visit (INDEPENDENT_AMBULATORY_CARE_PROVIDER_SITE_OTHER): Payer: PPO | Admitting: Internal Medicine

## 2024-08-24 ENCOUNTER — Encounter: Payer: Self-pay | Admitting: Internal Medicine

## 2024-08-24 VITALS — BP 134/72 | HR 91 | Ht 65.0 in | Wt 165.4 lb

## 2024-08-24 DIAGNOSIS — E1159 Type 2 diabetes mellitus with other circulatory complications: Secondary | ICD-10-CM | POA: Diagnosis not present

## 2024-08-24 DIAGNOSIS — Z794 Long term (current) use of insulin: Secondary | ICD-10-CM

## 2024-08-24 DIAGNOSIS — E1122 Type 2 diabetes mellitus with diabetic chronic kidney disease: Secondary | ICD-10-CM | POA: Diagnosis not present

## 2024-08-24 DIAGNOSIS — E118 Type 2 diabetes mellitus with unspecified complications: Secondary | ICD-10-CM | POA: Diagnosis not present

## 2024-08-24 DIAGNOSIS — N184 Chronic kidney disease, stage 4 (severe): Secondary | ICD-10-CM

## 2024-08-24 DIAGNOSIS — E1142 Type 2 diabetes mellitus with diabetic polyneuropathy: Secondary | ICD-10-CM

## 2024-08-24 LAB — POCT GLYCOSYLATED HEMOGLOBIN (HGB A1C): Hemoglobin A1C: 5.9 % — AB (ref 4.0–5.6)

## 2024-08-24 MED ORDER — NOVOLIN 70/30 RELION (70-30) 100 UNIT/ML ~~LOC~~ SUSP: 14.0000 [IU] | Freq: Two times a day (BID) | SUBCUTANEOUS | 4 refills | Status: AC

## 2024-08-24 MED ORDER — INSULIN SYRINGE-NEEDLE U-100 31G X 15/64" 0.3 ML MISC: 1.0000 | Freq: Two times a day (BID) | 3 refills | Status: AC

## 2024-08-24 NOTE — Patient Instructions (Signed)
-   Change Novolin  -Mix to 14 units with Breakfast and 14  units with Supper     - HOW TO TREAT LOW BLOOD SUGARS (Blood sugar LESS THAN 70 MG/DL) Please follow the RULE OF 15 for the treatment of hypoglycemia treatment (when your (blood sugars are less than 70 mg/dL)   STEP 1: Take 15 grams of carbohydrates when your blood sugar is low, which includes:  3-4 GLUCOSE TABS  OR 3-4 OZ OF JUICE OR REGULAR SODA OR ONE TUBE OF GLUCOSE GEL    STEP 2: RECHECK blood sugar in 15 MINUTES STEP 3: If your blood sugar is still low at the 15 minute recheck --> then, go back to STEP 1 and treat AGAIN with another 15 grams of carbohydrates.

## 2024-08-24 NOTE — Progress Notes (Signed)
 Name: Derek Mckenzie  Age/ Sex: 68 y.o., male   MRN/ DOB: [id number], 09/16/1956     PCP: Rosamond Leta NOVAK, MD   Reason for Endocrinology Evaluation: Type 2 Diabetes Mellitus  Initial Endocrine Consultative Visit: 05/16/2019    PATIENT IDENTIFIER: Derek Mckenzie is a 68 y.o. male with a past medical history of HTN, T2DM, cardiomyopathy and dyslipidemia. The patient has followed with Endocrinology clinic since 05/16/2019 for consultative assistance with management of his diabetes.  DIABETIC HISTORY:  Mr. Soules was diagnosed with T2DM > 20 yrs ago. He does not recall prior oral glycemic agents, he has been on insulin  for years.  His hemoglobin A1c has ranged from 12.2% in 2016, peaking at 13.1% in  2019   On his initial visit to our clinic, his A1c was 12.2% and he was on Novolin - N only. We started him on Novolin - Mix   He declined switching to Vials in 2022  SUBJECTIVE:   During the last visit (02/25/2024): A1c 7.3%     Today (08/24/2024): Derek Mckenzie is here for a  follow up on diabetes management.  He checks his blood sugars 2 times daily, preprandial to breakfast and supper.  The patient has has  had hypoglycemic episodes since the last clinic visit. He is asymptomatic with these episodes.  He had a follow-up with podiatry 06/14/2024 Patient follows with cardiology for CHF, PAD He also follows with nephrology for CKD (Dr. Rachele)    He denies any nausea or vomiting  Has noted constipation , which has been improving   HOME DIABETES REGIMEN:  Novolin  Mix (70/30) 16 units BID with Breakfast and 14 units with supper  -he has been taking 14 with breakfast and 16 with dinner Jardiance  10 mg daily - per cardio   METER DOWNLOAD SUMMARY: 7/29-8/27/2025 Overall Mean FS Glucose = 115 Standard Deviation =29  BG Ranges: Low = 67 High = 192   Hypoglycemic Events/30 Days: BG < 50 = 0 Episodes of symptomatic severe hypoglycemia = 0     DIABETIC  COMPLICATIONS: Microvascular complications:  CKD IV Denies: neuropathy, retinopathy Last eye exam: Completed 06/2024   Macrovascular complications:  CHF, PVD Denies: CAD, CVA    HISTORY:  Past Medical History:  Past Medical History:  Diagnosis Date   CHF (congestive heart failure) (HCC)    Deafness in left ear    ED (erectile dysfunction)    Hearing loss in right ear    Hyperlipidemia    Hypertension    PAD (peripheral artery disease) (HCC)    Type II diabetes mellitus (HCC)    Past Surgical History:  Past Surgical History:  Procedure Laterality Date   CATARACT EXTRACTION W/PHACO Left 03/11/2016   Procedure: CATARACT EXTRACTION PHACO AND INTRAOCULAR LENS PLACEMENT (IOC);  Surgeon: Oneil Platts, MD;  [facility]: [facility];  Service: Ophthalmology;  Laterality: Left;  CDE:2.71   CATARACT EXTRACTION W/PHACO Right 05/20/2016   Procedure: CATARACT EXTRACTION PHACO AND INTRAOCULAR LENS PLACEMENT (IOC);  Surgeon: Oneil Platts, MD;  Location: [facility];  Service: Ophthalmology;  Laterality: Right;  CDE: 4.33   COLONOSCOPY N/A 01/20/2020   Procedure: COLONOSCOPY;  Surgeon: Harvey Margo CROME, MD;  [facility];  Service: Endoscopy;  Laterality: N/A;  8:30am   COLONOSCOPY WITH PROPOFOL  N/A 02/16/2023   Procedure: COLONOSCOPY WITH PROPOFOL ;  Surgeon: Cindie Carlin POUR, DO;  [facility];  Service: Endoscopy;  Laterality: N/A;  1030am, asa 3   POLYPECTOMY  01/20/2020   Procedure: POLYPECTOMY;  Surgeon: Harvey Margo CROME, MD;  [facility];  Service: Endoscopy;;   POLYPECTOMY  02/16/2023   Procedure: POLYPECTOMY;  Surgeon: Cindie Carlin POUR, DO;  [facility];  Service: Endoscopy;;   RETINAL LASER PROCEDURE     VASECTOMY     YAG LASER APPLICATION Right 02/24/2017   Procedure: YAG LASER APPLICATION;  Surgeon: Oneil Platts, MD;  Location: [facility];  Service: Ophthalmology;  Laterality: Right;   Social History:  reports that he has quit smoking. His smoking use  included cigarettes. He has a 17.5 pack-year smoking history. He has never used smokeless tobacco. He reports that he does not drink alcohol  and does not use drugs. Family History:  Family History  Problem Relation Age of Onset   Hypertension Mother    Stroke Mother    Heart disease Father    Other Neg Hx        gynecomastia   Colon cancer Neg Hx    Colon polyps Neg Hx      HOME MEDICATIONS: Allergies as of 08/24/2024       Reactions   Atenolol Shortness Of Breath   Amlodipine  Besy-benazepril Hcl Other (See Comments)   Headaches        Medication List        Accurate as of August 24, 2024 10:49 AM. If you have any questions, ask your nurse or doctor.          Alpha-Lipoic Acid 200 MG Caps Take 200 mg by mouth daily.   amLODipine  10 MG tablet Commonly known as: NORVASC  Take 1 tablet (10 mg total) by mouth daily. What changed: how much to take   atorvastatin  80 MG tablet Commonly known as: LIPITOR Take 1 tablet (80 mg total) by mouth daily. Take 1 tablet (80 mg total) by mouth daily. PLEASE SCHEDULE APPOINTMENT FOR MORE REFILLS   carvedilol  25 MG tablet Commonly known as: COREG  take 1 tablet (25 MILLIGRAM total) by mouth 2 (two) times daily with a meal.   clobetasol cream 0.05 % Commonly known as: TEMOVATE SMARTSIG:1 Topical Daily   cyanocobalamin  1000 MCG tablet Commonly known as: VITAMIN B12 Take 1,000 mcg by mouth daily.   ezetimibe  10 MG tablet Commonly known as: ZETIA  Take 1 tablet (10 mg total) by mouth daily. NEEDS FOLLOW UP APPOINTMENT FOR MORE REFILLS   fenofibrate  145 MG tablet Commonly known as: TRICOR  Take 1 tablet (145 mg total) by mouth daily. PLEASE SCHEDULE APPOINTMENT FOR MORE REFILLS   folic acid  400 MCG tablet Commonly known as: FOLVITE  Take 400 mcg by mouth daily.   furosemide  20 MG tablet Commonly known as: LASIX  Take 1 tablet (20 mg total) by mouth as needed. For weight gain of 3 lbs in 24 hours or 5 lbs in a week    gabapentin 100 MG capsule Commonly known as: NEURONTIN Take 100 mg by mouth daily.   hydrALAZINE  100 MG tablet Commonly known as: APRESOLINE  take 1 tablet (100 MILLIGRAM total) by mouth 3 (three) times daily.   Insulin  Syringe-Needle U-100 31G X 15/64 0.3 ML Misc 1 Device by Does not apply route in the morning and at bedtime.   isosorbide  mononitrate 60 MG 24 hr tablet Commonly known as: IMDUR  Take 1 tablet (60 mg total) by mouth daily. NEEDS FOLLOW UP APPOINTMENT FOR MORE REFILLS   Jardiance  10 MG Tabs tablet Generic drug: empagliflozin  TAKE 1 TABLET BY MOUTH ONCE DAILY BEFORE BREAKFAST   latanoprost  0.005 % ophthalmic solution Commonly known as: XALATAN   Place 1 drop into both eyes at bedtime.   Lokelma  5 g packet Generic drug: sodium zirconium cyclosilicate  Take 1/2 packet by mouth today only as directed by the heart failure clinic   NIFEdipine 60 MG 24 hr tablet Commonly known as: PROCARDIA XL/NIFEDICAL XL Take 60 mg by mouth 2 (two) times daily.   NovoLIN  70/30 ReliOn (70-30) 100 UNIT/ML injection Generic drug: insulin  NPH-regular Human Inject 16 Units into the skin daily with breakfast AND 14 Units daily with supper.   OneTouch Ultra Test test strip Generic drug: glucose blood USE 1 STRIP TO CHECK GLUCOSE THREE TIMES DAILY   spironolactone  25 MG tablet Commonly known as: ALDACTONE    tamsulosin 0.4 MG Caps capsule Commonly known as: FLOMAX Take by mouth.   Vitamin D (Ergocalciferol) 1.25 MG (50000 UNIT) Caps capsule Commonly known as: DRISDOL Take 50,000 Units by mouth once a week.         OBJECTIVE:   Vital Signs: BP 134/72 (BP Location: Left Arm, Patient Position: Sitting, Cuff Size: Normal)   Pulse 91   Ht 5' 5 (1.651 m)   Wt 165 lb 6.4 oz (75 kg)   SpO2 95%   BMI 27.52 kg/m   Wt Readings from Last 3 Encounters:  08/24/24 165 lb 6.4 oz (75 kg)  02/25/24 183 lb (83 kg)  12/07/23 184 lb 6.4 oz (83.6 kg)     Exam: General: Pt appears  well and is in NAD  Lungs: Clear with good BS bilat   Heart: RRR   Extremities: Trace pretibial edema with stasis dermatitis  Neuro: MS is good with appropriate affect, pt is alert and Ox3     DM foot exam: 12/29/2023 per podiatry    DATA REVIEWED:  Lab Results  Component Value Date   HGBA1C 5.9 (A) 08/24/2024   HGBA1C 7.3 (A) 02/25/2024   HGBA1C 7.2 (A) 06/11/2023    Latest Reference Range & Units 12/14/23 12:40  Sodium 135 - 145 mmol/L 141  Potassium 3.5 - 5.1 mmol/L 5.5 (H)  Chloride 98 - 111 mmol/L 112 (H)  CO2 22 - 32 mmol/L 22  Glucose 70 - 99 mg/dL 882 (H)  BUN 8 - 23 mg/dL 34 (H)  Creatinine 9.38 - 1.24 mg/dL 7.09 (H)  Calcium  8.9 - 10.3 mg/dL 9.1  Anion gap 5 - 15  7  GFR, Estimated >60 mL/min 23 (L)    Latest Reference Range & Units 12/07/23 12:06  Total CHOL/HDL Ratio RATIO 4.4  Cholesterol 0 - 200 mg/dL 898  HDL Cholesterol >59 mg/dL 23 (L)  LDL (calc) 0 - 99 mg/dL 59  Triglycerides <849 mg/dL 94  VLDL 0 - 40 mg/dL 19   ASSESSMENT / PLAN / RECOMMENDATIONS:   1) Type 2 Diabetes Mellitus, With CKD IV complications and PVD - Most recent A1c of 7.3 %. Goal A1c < 7.0 %.   - A1c  5.9% , this is skewed due to CKD - Freestyle libre was inaccurate with 40-60 points lower then finger stick, I did offer to prescribe Dexcom, but the patient is not interested in CGM technology -Historically unable to add glycemic agents due to concerns about the cost -He is on Jardiance  through cardiology - Patient declines switching to insulin  pens again - He has been taking less insulin  with breakfast and more in the evening than previously prescribed - I will adjust his insulin  as below - Patient with severe auditory limitations which makes communication difficult  MEDICATIONS: - Change   Novolin  Mix (  70/30) 14 units with Breakfast and 14 units with supper    EDUCATION / INSTRUCTIONS: BG monitoring instructions: Patient is instructed to check his blood sugars 2 times a day,  before breakfast and supper. Call [facility] if: BG persistently < 70 I reviewed the Rule of 15 for the treatment of hypoglycemia in detail with the patient. Literature supplied.    2) Dyslipidemia:  -Per cardiology     F/U  6 months        Signed electronically by: Stefano Redgie Butts, MD  [facility]  [facility] [address] [place] [place], [place] [address] Phone: [phone] FAX: [phone]   CC: Rosamond Leta NOVAK, MD [address] [place] [address] Phone: [phone]  Fax: [phone]  Return to Endocrinology clinic as below: Future Appointments  Date Time Provider Department Center  08/24/2024 10:50 AM Besse Miron, Donell Redgie, MD [facility] None  09/14/2024 10:00 AM Branch, Dorn FALCON, MD [facility]

## 2024-08-26 NOTE — Progress Notes (Signed)
   08/26/2024  Patient ID: Derek Mckenzie, male   DOB: 1955-12-31, 68 y.o.   MRN: [id number]  Contacted patient regarding medication adherence from a quality report for [facility]. The patient is listed at-risk for MAD in 2025.    Left patient a HIPAA voicemail to return my call at their convenience.   Heather Factor, PharmD Clinical Pharmacist  [phone]

## 2024-09-09 DIAGNOSIS — Z299 Encounter for prophylactic measures, unspecified: Secondary | ICD-10-CM | POA: Diagnosis not present

## 2024-09-09 DIAGNOSIS — R5383 Other fatigue: Secondary | ICD-10-CM | POA: Diagnosis not present

## 2024-09-09 DIAGNOSIS — Z125 Encounter for screening for malignant neoplasm of prostate: Secondary | ICD-10-CM | POA: Diagnosis not present

## 2024-09-09 DIAGNOSIS — Z79899 Other long term (current) drug therapy: Secondary | ICD-10-CM | POA: Diagnosis not present

## 2024-09-09 DIAGNOSIS — E7800 Pure hypercholesterolemia, unspecified: Secondary | ICD-10-CM | POA: Diagnosis not present

## 2024-09-09 DIAGNOSIS — Z0000 Encounter for general adult medical examination without abnormal findings: Secondary | ICD-10-CM | POA: Diagnosis not present

## 2024-09-09 DIAGNOSIS — I10 Essential (primary) hypertension: Secondary | ICD-10-CM | POA: Diagnosis not present

## 2024-09-12 DIAGNOSIS — E118 Type 2 diabetes mellitus with unspecified complications: Secondary | ICD-10-CM | POA: Diagnosis not present

## 2024-09-13 DIAGNOSIS — L84 Corns and callosities: Secondary | ICD-10-CM | POA: Diagnosis not present

## 2024-09-13 DIAGNOSIS — M79674 Pain in right toe(s): Secondary | ICD-10-CM | POA: Diagnosis not present

## 2024-09-13 DIAGNOSIS — B351 Tinea unguium: Secondary | ICD-10-CM | POA: Diagnosis not present

## 2024-09-13 DIAGNOSIS — E1142 Type 2 diabetes mellitus with diabetic polyneuropathy: Secondary | ICD-10-CM | POA: Diagnosis not present

## 2024-09-13 DIAGNOSIS — M79675 Pain in left toe(s): Secondary | ICD-10-CM | POA: Diagnosis not present

## 2024-09-14 ENCOUNTER — Ambulatory Visit: Attending: Cardiology | Admitting: Cardiology

## 2024-09-14 ENCOUNTER — Encounter: Payer: Self-pay | Admitting: Cardiology

## 2024-09-14 VITALS — BP 118/72 | HR 87 | Ht 65.0 in | Wt 164.8 lb

## 2024-09-14 DIAGNOSIS — I10 Essential (primary) hypertension: Secondary | ICD-10-CM | POA: Diagnosis not present

## 2024-09-14 DIAGNOSIS — E782 Mixed hyperlipidemia: Secondary | ICD-10-CM

## 2024-09-14 DIAGNOSIS — I5032 Chronic diastolic (congestive) heart failure: Secondary | ICD-10-CM | POA: Diagnosis not present

## 2024-09-14 DIAGNOSIS — I739 Peripheral vascular disease, unspecified: Secondary | ICD-10-CM | POA: Diagnosis not present

## 2024-09-14 NOTE — Progress Notes (Signed)
 Clinical Summary Derek Mckenzie is a 68 y.o.male seen today for follow up of the following medical problems.   1.Chronic HFimpEF - 10/2015 echo LVEF 30-35% - 11/2016 echo LVEF 60-65% - 11/2023 echo: LVEF 60-65%, no WMAs, indet diastolic, normal RV - from notes LVEF improved with bp control, throught to be HTN cardiomyopathy - no SOB/DOE, no LE edema - compliant with meds. Off entresto  due to CKD  2. HTN - 10/2015 renal artery US  was negative - compliant with meds  3. PAD -2012 peripheral arterial dopplers with right distal SFA stenosis and left mid SFA stenosis.  - Peripheral arterial dopplers (12/17): Probable right iliac obstruction and distal left SFA obstruction.  - ABIs (12/18): 0.61 right, 0.9 left (improved).  - ABIs (1/20): 0.62 right, 0.85 left - ABIs (12/21): moderate right PAD, no changes.  - 09/2023 ABI right 0.55 left 0.51 - conservative management given CKD and risk for contrast nephropathy - mild symptoms overall not limting.   4. CKD 3B - followed by nephrology, Dr Rachele  5. HLD - 08/2024 TC 106 TG 98 HDL 28 LDL 59  Past Medical History:  Diagnosis Date   CHF (congestive heart failure) (HCC)    Deafness in left ear    ED (erectile dysfunction)    Hearing loss in right ear    Hyperlipidemia    Hypertension    PAD (peripheral artery disease) (HCC)    Type II diabetes mellitus (HCC)      Allergies  Allergen Reactions   Atenolol Shortness Of Breath   Amlodipine  Besy-Benazepril Hcl Other (See Comments)    Headaches     Current Outpatient Medications  Medication Sig Dispense Refill   Alpha-Lipoic Acid 200 MG CAPS Take 200 mg by mouth daily.      amLODipine  (NORVASC ) 10 MG tablet Take 1 tablet (10 mg total) by mouth daily. (Patient taking differently: Take 5 mg by mouth daily.) 90 tablet 3   atorvastatin  (LIPITOR) 80 MG tablet Take 1 tablet (80 mg total) by mouth daily. Take 1 tablet (80 mg total) by mouth daily. PLEASE SCHEDULE APPOINTMENT  FOR MORE REFILLS 90 tablet 0   carvedilol  (COREG ) 25 MG tablet take 1 tablet (25 MILLIGRAM total) by mouth 2 (two) times daily with a meal. 180 tablet 0   clobetasol cream (TEMOVATE) 0.05 % SMARTSIG:1 Topical Daily     empagliflozin  (JARDIANCE ) 10 MG TABS tablet TAKE 1 TABLET BY MOUTH ONCE DAILY BEFORE BREAKFAST 30 tablet 11   ezetimibe  (ZETIA ) 10 MG tablet Take 1 tablet (10 mg total) by mouth daily. NEEDS FOLLOW UP APPOINTMENT FOR MORE REFILLS 90 tablet 0   fenofibrate  (TRICOR ) 145 MG tablet Take 1 tablet (145 mg total) by mouth daily. PLEASE SCHEDULE APPOINTMENT FOR MORE REFILLS 90 tablet 0   folic acid  (FOLVITE ) 400 MCG tablet Take 400 mcg by mouth daily.      furosemide  (LASIX ) 20 MG tablet Take 1 tablet (20 mg total) by mouth as needed. For weight gain of 3 lbs in 24 hours or 5 lbs in a week 30 tablet 6   gabapentin (NEURONTIN) 100 MG capsule Take 100 mg by mouth daily.     hydrALAZINE  (APRESOLINE ) 100 MG tablet take 1 tablet (100 MILLIGRAM total) by mouth 3 (three) times daily. 252 tablet 0   insulin  NPH-regular Human (NOVOLIN  70/30 RELION) (70-30) 100 UNIT/ML injection Inject 14 Units into the skin 2 (two) times daily before a meal. 30 mL 4   Insulin  Syringe-Needle  U-100 31G X 15/64 0.3 ML MISC 1 Device by Does not apply route in the morning and at bedtime. 200 each 3   isosorbide  mononitrate (IMDUR ) 60 MG 24 hr tablet Take 1 tablet (60 mg total) by mouth daily. NEEDS FOLLOW UP APPOINTMENT FOR MORE REFILLS 180 tablet 0   latanoprost  (XALATAN ) 0.005 % ophthalmic solution Place 1 drop into both eyes at bedtime.     NIFEdipine (PROCARDIA XL/NIFEDICAL XL) 60 MG 24 hr tablet Take 60 mg by mouth 2 (two) times daily.     ONETOUCH ULTRA TEST test strip USE 1 STRIP TO CHECK GLUCOSE THREE TIMES DAILY     sodium zirconium cyclosilicate  (LOKELMA ) 5 g packet Take 1/2 packet by mouth today only as directed by the heart failure clinic 1 packet 0   spironolactone  (ALDACTONE ) 25 MG tablet  (Patient not  taking: Reported on 08/24/2024)     tamsulosin (FLOMAX) 0.4 MG CAPS capsule Take by mouth.     vitamin B-12 (CYANOCOBALAMIN ) 1000 MCG tablet Take 1,000 mcg by mouth daily.      Vitamin D, Ergocalciferol, (DRISDOL) 1.25 MG (50000 UNIT) CAPS capsule Take 50,000 Units by mouth once a week.     No current facility-administered medications for this visit.     Past Surgical History:  Procedure Laterality Date   CATARACT EXTRACTION W/PHACO Left 03/11/2016   Procedure: CATARACT EXTRACTION PHACO AND INTRAOCULAR LENS PLACEMENT (IOC);  Surgeon: Oneil Platts, MD;  [facility];  Service: Ophthalmology;  Laterality: Left;  CDE:2.71   CATARACT EXTRACTION W/PHACO Right 05/20/2016   Procedure: CATARACT EXTRACTION PHACO AND INTRAOCULAR LENS PLACEMENT (IOC);  Surgeon: Oneil Platts, MD;  Location: [facility];  Service: Ophthalmology;  Laterality: Right;  CDE: 4.33   COLONOSCOPY N/A 01/20/2020   Procedure: COLONOSCOPY;  Surgeon: Harvey Margo CROME, MD;  [facility];  Service: Endoscopy;  Laterality: N/A;  8:30am   COLONOSCOPY WITH PROPOFOL  N/A 02/16/2023   Procedure: COLONOSCOPY WITH PROPOFOL ;  Surgeon: Cindie Carlin POUR, DO;  [facility];  Service: Endoscopy;  Laterality: N/A;  1030am, asa 3   POLYPECTOMY  01/20/2020   Procedure: POLYPECTOMY;  Surgeon: Harvey Margo CROME, MD;  [facility];  Service: Endoscopy;;   POLYPECTOMY  02/16/2023   Procedure: POLYPECTOMY;  Surgeon: Cindie Carlin POUR, DO;  [facility];  Service: Endoscopy;;   RETINAL LASER PROCEDURE     VASECTOMY     YAG LASER APPLICATION Right 02/24/2017   Procedure: YAG LASER APPLICATION;  Surgeon: Oneil Platts, MD;  Location: [facility];  Service: Ophthalmology;  Laterality: Right;     Allergies  Allergen Reactions   Atenolol Shortness Of Breath   Amlodipine  Besy-Benazepril Hcl Other (See Comments)    Headaches      Family History  Problem Relation Age of Onset   Hypertension Mother    Stroke Mother     Heart disease Father    Other Neg Hx        gynecomastia   Colon cancer Neg Hx    Colon polyps Neg Hx      Social History Mr. Hensch reports that he has quit smoking. His smoking use included cigarettes. He has a 17.5 pack-year smoking history. He has never used smokeless tobacco. Mr. Dettore reports no history of alcohol  use.    Physical Examination Today's Vitals   09/14/24 0945  BP: 118/72  Pulse: 87  SpO2: 97%  Weight: 164 lb 12.8 oz (74.8 kg)  Height: 5' 5 (1.651  m)   Body mass index is 27.42 kg/m.  Gen: resting comfortably, no acute distress HEENT: no scleral icterus, pupils equal round and reactive, no palptable cervical adenopathy,  CV: RRR, no m/rg, no jvd Resp: Clear to auscultation bilaterally GI: abdomen is soft, non-tender, non-distended, normal bowel sounds, no hepatosplenomegaly MSK: extremities are warm, no edema.  Skin: warm, no rash Neuro:  no focal deficits Psych: appropriate affect     Assessment and Plan  1.HFimpEF - no recent symptoms - continue current medical therapy, regimen has been affected by his CKD  2. HTN - at goal, continue current meds  3. HLD - at goal, continue current meds  4. PAD - mild nonlimiting symptoms - have not pursued angiography due to CKD and high risk for contrast nephropathy.  5. HLD - at goal, continue current meds      Dorn PHEBE Ross, M.D.

## 2024-09-14 NOTE — Patient Instructions (Signed)
 Medication Instructions:  Your physician recommends that you continue on your current medications as directed. Please refer to the Current Medication list given to you today.  *If you need a refill on your cardiac medications before your next appointment, please call your pharmacy*  Lab Work: None If you have labs (blood work) drawn today and your tests are completely normal, you will receive your results only by: MyChart Message (if you have MyChart) OR A paper copy in the mail If you have any lab test that is abnormal or we need to change your treatment, we will call you to review the results.  Testing/Procedures: None  Follow-Up: At [facility], you and your health needs are our priority.  As part of our continuing mission to provide you with exceptional heart care, our providers are all part of one team.  This team includes your primary Cardiologist (physician) and Advanced Practice Providers or APPs (Physician Assistants and Nurse Practitioners) who all work together to provide you with the care you need, when you need it.  Your next appointment:   6 month(s)  Provider:   You may see Dina Rich, MD or one of the following Advanced Practice Providers on your designated Care Team:   Randall An, PA-C  [place], [place] Jacolyn Reedy, [place]     We recommend signing up for the patient portal called "MyChart".  Sign up information is provided on this After Visit Summary.  MyChart is used to connect with patients for Virtual Visits (Telemedicine).  Patients are able to view lab/test results, encounter notes, upcoming appointments, etc.  Non-urgent messages can be sent to your provider as well.   To learn more about what you can do with MyChart, go to [email or web address].   Other Instructions

## 2024-09-19 DIAGNOSIS — E119 Type 2 diabetes mellitus without complications: Secondary | ICD-10-CM | POA: Diagnosis not present

## 2024-09-19 DIAGNOSIS — I10 Essential (primary) hypertension: Secondary | ICD-10-CM | POA: Diagnosis not present

## 2024-09-19 DIAGNOSIS — D631 Anemia in chronic kidney disease: Secondary | ICD-10-CM | POA: Diagnosis not present

## 2024-09-19 DIAGNOSIS — R809 Proteinuria, unspecified: Secondary | ICD-10-CM | POA: Diagnosis not present

## 2024-09-19 DIAGNOSIS — N189 Chronic kidney disease, unspecified: Secondary | ICD-10-CM | POA: Diagnosis not present

## 2024-09-26 DIAGNOSIS — N1832 Chronic kidney disease, stage 3b: Secondary | ICD-10-CM | POA: Diagnosis not present

## 2024-09-26 DIAGNOSIS — I5022 Chronic systolic (congestive) heart failure: Secondary | ICD-10-CM | POA: Diagnosis not present

## 2024-09-26 DIAGNOSIS — R809 Proteinuria, unspecified: Secondary | ICD-10-CM | POA: Diagnosis not present

## 2024-09-26 DIAGNOSIS — N2581 Secondary hyperparathyroidism of renal origin: Secondary | ICD-10-CM | POA: Diagnosis not present

## 2024-10-16 ENCOUNTER — Other Ambulatory Visit (HOSPITAL_COMMUNITY): Payer: Self-pay | Admitting: Cardiology

## 2024-10-17 ENCOUNTER — Telehealth: Payer: Self-pay | Admitting: Cardiology

## 2024-10-17 NOTE — Telephone Encounter (Signed)
*  STAT* If patient is at the pharmacy, call can be transferred to refill team.   1. Which medications need to be refilled? (please list name of each medication and dose if known)  carvedilol  (COREG ) 25 MG tablet   NIFEdipine (PROCARDIA XL/NIFEDICAL XL) 60 MG 24 hr tablet    2. Would you like to learn more about the convenience, safety, & potential cost savings by using the [facility]?     3. Are you open to using the [facility] (Type [facility].  ).   4. Which pharmacy/location (including street and city if local pharmacy) is medication to be sent to? [facility] [address], [place] - [address]    5. Do they need a 30 day or 90 day supply? 90 day

## 2024-10-17 NOTE — Telephone Encounter (Signed)
 Pt of Dr. Alvan. This RX was not prescribed or refilled by Dr. Alvan. Does Dr. Alvan want to refill? Please advise.

## 2024-10-18 ENCOUNTER — Other Ambulatory Visit (HOSPITAL_COMMUNITY): Payer: Self-pay | Admitting: Family Medicine

## 2024-10-18 ENCOUNTER — Other Ambulatory Visit (HOSPITAL_COMMUNITY): Payer: Self-pay | Admitting: Internal Medicine

## 2024-10-18 ENCOUNTER — Other Ambulatory Visit: Payer: Self-pay | Admitting: Cardiology

## 2024-10-18 DIAGNOSIS — Z23 Encounter for immunization: Secondary | ICD-10-CM | POA: Diagnosis not present

## 2024-10-19 ENCOUNTER — Telehealth (HOSPITAL_COMMUNITY): Payer: Self-pay | Admitting: Cardiology

## 2024-10-19 ENCOUNTER — Other Ambulatory Visit (HOSPITAL_COMMUNITY): Payer: Self-pay | Admitting: Cardiology

## 2024-10-24 ENCOUNTER — Telehealth (HOSPITAL_COMMUNITY): Payer: Self-pay | Admitting: Cardiology

## 2024-10-24 NOTE — Telephone Encounter (Signed)
 I spoke with both [facility] and [facility], neither have filled amlodipine  in the past year. I have left a message for patient to return call.

## 2024-10-28 NOTE — Telephone Encounter (Signed)
 MyChart message sent to patient regarding medications. Will await response.

## 2024-10-31 NOTE — Telephone Encounter (Signed)
 Patient confirmed via MyChart that Amlodipine  was d/c'd by Dr. Rachele when he was started of Nifedipine. Pt stated that Nifedipine is refilled by nephrologist and not cardiologist.   Will remove Amlodipine  from pt's medication list and route to provider as FYI.

## 2024-11-02 ENCOUNTER — Other Ambulatory Visit: Payer: Self-pay

## 2024-11-02 MED ORDER — EMPAGLIFLOZIN 10 MG PO TABS
10.0000 mg | ORAL_TABLET | Freq: Every day | ORAL | 11 refills | Status: DC
  Filled 2024-12-27: qty 30, 30d supply, fill #0

## 2024-11-22 DIAGNOSIS — L84 Corns and callosities: Secondary | ICD-10-CM | POA: Diagnosis not present

## 2024-11-22 DIAGNOSIS — B351 Tinea unguium: Secondary | ICD-10-CM | POA: Diagnosis not present

## 2024-11-22 DIAGNOSIS — M79674 Pain in right toe(s): Secondary | ICD-10-CM | POA: Diagnosis not present

## 2024-11-22 DIAGNOSIS — M79675 Pain in left toe(s): Secondary | ICD-10-CM | POA: Diagnosis not present

## 2024-11-22 DIAGNOSIS — E1142 Type 2 diabetes mellitus with diabetic polyneuropathy: Secondary | ICD-10-CM | POA: Diagnosis not present

## 2024-12-16 ENCOUNTER — Other Ambulatory Visit (HOSPITAL_COMMUNITY): Payer: Self-pay | Admitting: Internal Medicine

## 2024-12-17 ENCOUNTER — Other Ambulatory Visit (HOSPITAL_COMMUNITY): Payer: Self-pay | Admitting: Cardiology

## 2024-12-19 ENCOUNTER — Telehealth: Payer: Self-pay | Admitting: Cardiology

## 2024-12-19 NOTE — Telephone Encounter (Signed)
 Pt came into office and stated that he has requested refills for his medication and the pharmacy keeps sending the requests to a doctor in [place]. Pt states he uses [facility] in [place]. Pt states he needs refills for Carvedilol  25 mg, Fenofibrate  145 mg, Hydralazine  100 mg, Isosorbide  Mononit ER 60mg , Atorvastatin  80 mg, and Zetia  10 mg. He is also saying he will run out on Thursday.  Pt is also requesting that he get more than one refill at a time. [phone] is the best number to reach the pt.

## 2024-12-19 NOTE — Telephone Encounter (Signed)
 This is a CHF pt

## 2024-12-20 ENCOUNTER — Telehealth (HOSPITAL_COMMUNITY): Payer: Self-pay | Admitting: Cardiology

## 2024-12-21 NOTE — Telephone Encounter (Signed)
 All medications listed were refilled  Message on refills for pt to contact HF clinic for follow up  Front office left message for pt to schedule follow up

## 2024-12-27 ENCOUNTER — Other Ambulatory Visit (HOSPITAL_BASED_OUTPATIENT_CLINIC_OR_DEPARTMENT_OTHER): Payer: Self-pay

## 2024-12-27 ENCOUNTER — Telehealth: Payer: Self-pay | Admitting: Cardiology

## 2024-12-27 MED ORDER — ONETOUCH ULTRA BLUE TEST VI STRP
ORAL_STRIP | 2 refills | Status: AC
  Filled 2024-12-27: qty 250, 84d supply, fill #0

## 2024-12-27 MED ORDER — LATANOPROST 0.005 % OP SOLN: 1.0000 [drp] | Freq: Every day | OPHTHALMIC | 3 refills | Status: AC

## 2024-12-27 MED ORDER — CITALOPRAM HYDROBROMIDE 20 MG PO TABS
20.0000 mg | ORAL_TABLET | Freq: Every day | ORAL | 3 refills | Status: DC
  Filled 2024-12-27: qty 30, 30d supply, fill #0

## 2024-12-27 NOTE — Telephone Encounter (Signed)
" °*  STAT* If patient is at the pharmacy, call can be transferred to refill team.   1. Which medications need to be refilled? (please list name of each medication and dose if known)   All heart medications   2. Would you like to learn more about the convenience, safety, & potential cost savings by using the [facility]?   3. Are you open to using the [facility] (Type [facility]. ).  4. Which pharmacy/location (including street and city if local pharmacy) is medication to be sent to?  [facility] - [place], [place] - [address]   5. Do they need a 30 day or 90 day supply?   30 day   Caller [place]) stated patient is hard of hearing and patient is no longer being seen in [place] and will need Dr. Alvan to refill his heart medications.  Caller stated patient still has some medication left. "

## 2024-12-28 ENCOUNTER — Other Ambulatory Visit: Payer: Self-pay

## 2024-12-28 ENCOUNTER — Other Ambulatory Visit (HOSPITAL_BASED_OUTPATIENT_CLINIC_OR_DEPARTMENT_OTHER): Payer: Self-pay

## 2024-12-28 MED ORDER — EZETIMIBE 10 MG PO TABS
10.0000 mg | ORAL_TABLET | Freq: Every day | ORAL | 3 refills | Status: AC
  Filled 2024-12-28 – 2025-01-12 (×2): qty 90, 90d supply, fill #0

## 2024-12-28 MED ORDER — CARVEDILOL 25 MG PO TABS
25.0000 mg | ORAL_TABLET | Freq: Two times a day (BID) | ORAL | 3 refills | Status: AC
  Filled 2024-12-28 – 2025-01-12 (×2): qty 180, 90d supply, fill #0

## 2024-12-28 MED ORDER — NIFEDIPINE ER OSMOTIC RELEASE 60 MG PO TB24
60.0000 mg | ORAL_TABLET | Freq: Two times a day (BID) | ORAL | 3 refills | Status: AC
  Filled 2024-12-28 – 2025-01-12 (×2): qty 180, 90d supply, fill #0

## 2024-12-28 MED ORDER — ATORVASTATIN CALCIUM 80 MG PO TABS
80.0000 mg | ORAL_TABLET | Freq: Every day | ORAL | 3 refills | Status: AC
  Filled 2024-12-28 – 2025-01-12 (×2): qty 90, 90d supply, fill #0

## 2024-12-28 MED ORDER — ISOSORBIDE MONONITRATE ER 60 MG PO TB24
60.0000 mg | ORAL_TABLET | Freq: Two times a day (BID) | ORAL | 3 refills | Status: AC
  Filled 2024-12-28: qty 180, 180d supply, fill #0
  Filled 2025-01-12: qty 180, 90d supply, fill #0

## 2024-12-28 MED ORDER — CARVEDILOL 25 MG PO TABS: 25.0000 mg | ORAL_TABLET | Freq: Two times a day (BID) | ORAL | 0 refills | Status: DC

## 2024-12-28 MED ORDER — ATORVASTATIN CALCIUM 80 MG PO TABS: 80.0000 mg | ORAL_TABLET | Freq: Every day | ORAL | 0 refills | Status: DC

## 2024-12-28 MED ORDER — FUROSEMIDE 20 MG PO TABS
20.0000 mg | ORAL_TABLET | ORAL | 6 refills | Status: AC | PRN
  Filled 2024-12-28: qty 30, 30d supply, fill #0

## 2024-12-28 MED ORDER — FENOFIBRATE 145 MG PO TABS
145.0000 mg | ORAL_TABLET | Freq: Every day | ORAL | 3 refills | Status: AC
  Filled 2024-12-28 – 2025-01-12 (×2): qty 90, 90d supply, fill #0

## 2024-12-28 MED ORDER — EMPAGLIFLOZIN 10 MG PO TABS: 10.0000 mg | ORAL_TABLET | Freq: Every day | ORAL | 11 refills | Status: AC

## 2024-12-28 MED ORDER — ISOSORBIDE MONONITRATE ER 60 MG PO TB24: 60.0000 mg | ORAL_TABLET | Freq: Every day | ORAL | 0 refills | Status: DC

## 2024-12-28 MED ORDER — HYDRALAZINE HCL 100 MG PO TABS
100.0000 mg | ORAL_TABLET | Freq: Three times a day (TID) | ORAL | 3 refills | Status: DC
  Filled 2024-12-28 – 2025-01-12 (×2): qty 270, 90d supply, fill #0

## 2024-12-28 MED ORDER — FENOFIBRATE 145 MG PO TABS: 145.0000 mg | ORAL_TABLET | Freq: Every day | ORAL | 0 refills | Status: DC

## 2024-12-28 NOTE — Addendum Note (Signed)
 Addended by: WILFRED, Cade Dashner  C on: 12/28/2024 08:35 AM   Modules accepted: Orders

## 2024-12-28 NOTE — Telephone Encounter (Signed)
 Requested Prescriptions   Pending Prescriptions Disp Refills   atorvastatin  (LIPITOR) 80 MG tablet 90 tablet 0    Sig: Take 1 tablet (80 mg total) by mouth daily.   carvedilol  (COREG ) 25 MG tablet 180 tablet 0    Sig: Take 1 tablet (25 mg total) by mouth 2 (two) times daily with a meal.   fenofibrate  (TRICOR ) 145 MG tablet 90 tablet 0    Sig: Take 1 tablet (145 mg total) by mouth daily.   isosorbide  mononitrate (IMDUR ) 60 MG 24 hr tablet 180 tablet 0    Sig: Take 1 tablet (60 mg total) by mouth daily. take 1 tablet (60 milligram total) by mouth 2 (two) times daily.

## 2025-01-03 ENCOUNTER — Other Ambulatory Visit (HOSPITAL_BASED_OUTPATIENT_CLINIC_OR_DEPARTMENT_OTHER): Payer: Self-pay

## 2025-01-05 ENCOUNTER — Other Ambulatory Visit (HOSPITAL_BASED_OUTPATIENT_CLINIC_OR_DEPARTMENT_OTHER): Payer: Self-pay

## 2025-01-12 ENCOUNTER — Other Ambulatory Visit (HOSPITAL_BASED_OUTPATIENT_CLINIC_OR_DEPARTMENT_OTHER): Payer: Self-pay

## 2025-01-12 ENCOUNTER — Other Ambulatory Visit: Payer: Self-pay

## 2025-01-16 ENCOUNTER — Other Ambulatory Visit (HOSPITAL_COMMUNITY): Payer: Self-pay | Admitting: Internal Medicine

## 2025-03-01 ENCOUNTER — Ambulatory Visit: Admitting: Internal Medicine
